# Patient Record
Sex: Male | Born: 1950 | Race: White | Hispanic: No | Marital: Married | Smoking: Never smoker
Health system: Southern US, Community
[De-identification: ages and names within clinical notes are randomized; demographics above are authoritative.]

## PROBLEM LIST (undated history)

## (undated) DIAGNOSIS — I4891 Unspecified atrial fibrillation: Secondary | ICD-10-CM

## (undated) DIAGNOSIS — Z9289 Personal history of other medical treatment: Secondary | ICD-10-CM

## (undated) DIAGNOSIS — M1990 Unspecified osteoarthritis, unspecified site: Secondary | ICD-10-CM

## (undated) DIAGNOSIS — G4733 Obstructive sleep apnea (adult) (pediatric): Secondary | ICD-10-CM

## (undated) DIAGNOSIS — R0600 Dyspnea, unspecified: Secondary | ICD-10-CM

## (undated) DIAGNOSIS — F410 Panic disorder [episodic paroxysmal anxiety] without agoraphobia: Secondary | ICD-10-CM

## (undated) DIAGNOSIS — J45909 Unspecified asthma, uncomplicated: Secondary | ICD-10-CM

## (undated) DIAGNOSIS — Z7901 Long term (current) use of anticoagulants: Secondary | ICD-10-CM

## (undated) DIAGNOSIS — I4510 Unspecified right bundle-branch block: Secondary | ICD-10-CM

## (undated) DIAGNOSIS — M109 Gout, unspecified: Secondary | ICD-10-CM

## (undated) DIAGNOSIS — Z87442 Personal history of urinary calculi: Secondary | ICD-10-CM

## (undated) DIAGNOSIS — N3281 Overactive bladder: Secondary | ICD-10-CM

## (undated) DIAGNOSIS — R001 Bradycardia, unspecified: Secondary | ICD-10-CM

## (undated) DIAGNOSIS — D039 Melanoma in situ, unspecified: Secondary | ICD-10-CM

## (undated) DIAGNOSIS — G4730 Sleep apnea, unspecified: Secondary | ICD-10-CM

## (undated) DIAGNOSIS — E559 Vitamin D deficiency, unspecified: Secondary | ICD-10-CM

## (undated) DIAGNOSIS — C61 Malignant neoplasm of prostate: Secondary | ICD-10-CM

## (undated) DIAGNOSIS — M479 Spondylosis, unspecified: Secondary | ICD-10-CM

## (undated) DIAGNOSIS — Z8619 Personal history of other infectious and parasitic diseases: Secondary | ICD-10-CM

## (undated) DIAGNOSIS — M48061 Spinal stenosis, lumbar region without neurogenic claudication: Secondary | ICD-10-CM

## (undated) DIAGNOSIS — I10 Essential (primary) hypertension: Secondary | ICD-10-CM

## (undated) HISTORY — DX: Personal history of other infectious and parasitic diseases: Z86.19

## (undated) HISTORY — DX: Gout, unspecified: M10.9

## (undated) HISTORY — DX: Malignant neoplasm of prostate: C61

## (undated) HISTORY — DX: Sleep apnea, unspecified: G47.30

## (undated) HISTORY — PX: COLONOSCOPY: SHX174

## (undated) HISTORY — DX: Personal history of other medical treatment: Z92.89

---
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
## 2010-12-04 DIAGNOSIS — C61 Malignant neoplasm of prostate: Secondary | ICD-10-CM

## 2010-12-04 HISTORY — PX: PROSTATE SURGERY: SHX751

## 2010-12-04 HISTORY — DX: Malignant neoplasm of prostate: C61

## 2011-05-16 HISTORY — PX: PROSTATECTOMY: SHX69

## 2013-07-04 HISTORY — PX: OTHER SURGICAL HISTORY: SHX169

## 2013-10-17 ENCOUNTER — Ambulatory Visit: Payer: Self-pay | Admitting: Family Medicine

## 2013-10-21 ENCOUNTER — Encounter: Payer: Self-pay | Admitting: *Deleted

## 2013-12-09 ENCOUNTER — Encounter: Payer: Self-pay | Admitting: General Surgery

## 2013-12-09 ENCOUNTER — Ambulatory Visit (INDEPENDENT_AMBULATORY_CARE_PROVIDER_SITE_OTHER): Payer: Managed Care, Other (non HMO) | Admitting: General Surgery

## 2013-12-09 ENCOUNTER — Other Ambulatory Visit: Payer: Self-pay | Admitting: General Surgery

## 2013-12-09 VITALS — BP 114/64 | HR 76 | Resp 16 | Ht 71.5 in | Wt 331.0 lb

## 2013-12-09 DIAGNOSIS — Z1211 Encounter for screening for malignant neoplasm of colon: Secondary | ICD-10-CM

## 2013-12-09 DIAGNOSIS — Z800 Family history of malignant neoplasm of digestive organs: Secondary | ICD-10-CM

## 2013-12-09 MED ORDER — POLYETHYLENE GLYCOL 3350 17 GM/SCOOP PO POWD: ORAL | Status: DC

## 2013-12-09 NOTE — Progress Notes (Signed)
Patient ID: Charles Floyd, male   DOB: 10-Mar-1951, 64 y.o.   MRN: [id number]  Chief Complaint  Patient presents with  . Colonoscopy    HPI Charles Floyd is a 63 y.o. male.  Here today to discuss having a colonoscopy. Last colonoscopy was 2003. Denies any gastrointestinal issues. HPI  Past Medical History  Diagnosis Date  . Cancer of prostate 2012  . Gout     Past Surgical History  Procedure Laterality Date  . Colonoscopy  Jan 2003    Dr Bary Castilla  . Prostatectomy  June 2012    [facility]    Family History  Problem Relation Age of Onset  . Cancer Mother     colon and breast    Social History History  Substance Use Topics  . Smoking status: Never Smoker   . Smokeless tobacco: Not on file  . Alcohol Use: Yes    No Known Allergies  Current Outpatient Prescriptions  Medication Sig Dispense Refill  . allopurinol (ZYLOPRIM) 100 MG tablet daily.      . polyethylene glycol powder (GLYCOLAX/MIRALAX) powder 255 grams one bottle for colonoscopy prep  255 g  0   No current facility-administered medications for this visit.    Review of Systems Review of Systems  Constitutional: Negative.   Respiratory: Negative.   Cardiovascular: Negative.     Blood pressure 114/64, pulse 76, resp. rate 16, height 5' 11.5" (1.816 m), weight 331 lb (150.141 kg).  Physical Exam Physical Exam  Constitutional: He is oriented to person, place, and time. He appears well-developed and well-nourished.  Cardiovascular: Normal rate, regular rhythm and normal heart sounds.   Pulmonary/Chest: Effort normal and breath sounds normal.  Abdominal: Soft.  Lymphadenopathy:    He has no cervical adenopathy.  Neurological: He is alert and oriented to person, place, and time.  Skin: Skin is warm and dry.    Data Reviewed December 16, 2001 colonoscopy was negative.  Assessment    Candidate for screening colonoscopy.     Plan    Colonoscopy with possible biopsy/polypectomy prn:  Information regarding the procedure, including its potential risks and complications (including but not limited to perforation of the bowel, which may require emergency surgery to repair, and bleeding) was verbally given to the patient. Educational information regarding lower instestinal endoscopy was given to the patient. Written instructions for how to complete the bowel prep using Miralax were provided. The importance of drinking ample fluids to avoid dehydration as a result of the prep emphasized.    Patient has been scheduled for a colonoscopy on 12-17-13 at [facility].   Robert Bellow 12/09/2013, 9:39 PM

## 2013-12-09 NOTE — Patient Instructions (Addendum)
The patient is aware to call back for any questions or concerns.  Colonoscopy A colonoscopy is an exam to look at your colon. This exam helps to find lumps (tumors), growths (polyps), puffiness (swelling), and bleeding in your colon.  BEFORE THE PROCEDURE  You may need to drink clear liquids for 2 days before the exam.  Ask your doctor about changing or stopping your regular medicines.  You may need liquid or medicines put in your butt (enema or laxatives) to help you poop.  You may need to drink a liquid over a short amount of time. This liquid cleans your colon.  Ask your doctor what time you need to arrive. PROCEDURE A tube is put in the opening of your butt (anus) and into your colon. The doctor will look for anything that is not normal. Your doctor may take a tissue sample (biopsy) from your colon to be looked at more closely. AFTER THE PROCEDURE  Have someone drive you home if you took pain medicine or a medicine to relax you (sedative).  You may see blood in your poop (stool). This is normal.  You may pass gas and have belly (abdominal) cramps. This is normal. Finding out the results of your test Ask when your test results will be ready. Make sure you get your test results. Document Released: 12/23/2010 Document Revised: 03/17/2013 Document Reviewed: 12/23/2010 [facility] Patient Information 2014 [place], [place].  Patient has been scheduled for a colonoscopy on 12-17-13 at [facility].

## 2013-12-17 ENCOUNTER — Ambulatory Visit: Payer: Self-pay | Admitting: General Surgery

## 2013-12-17 DIAGNOSIS — D128 Benign neoplasm of rectum: Secondary | ICD-10-CM

## 2013-12-17 DIAGNOSIS — Z800 Family history of malignant neoplasm of digestive organs: Secondary | ICD-10-CM

## 2013-12-17 DIAGNOSIS — Z1211 Encounter for screening for malignant neoplasm of colon: Secondary | ICD-10-CM

## 2013-12-17 DIAGNOSIS — D129 Benign neoplasm of anus and anal canal: Secondary | ICD-10-CM

## 2013-12-18 LAB — PATHOLOGY REPORT

## 2013-12-19 ENCOUNTER — Encounter: Payer: Self-pay | Admitting: General Surgery

## 2013-12-24 ENCOUNTER — Encounter: Payer: Self-pay | Admitting: General Surgery

## 2013-12-24 ENCOUNTER — Telehealth: Payer: Self-pay

## 2013-12-24 NOTE — Telephone Encounter (Signed)
Message copied by Lesly Rubenstein on Wed Dec 24, 2013  8:32 AM ------      Message from: [place], [place]      Created: Mon Dec 22, 2013  3:13 PM       Please notify the patient that the polyps removed at the time of his colonoscopy on the 14th were benign, but he should plan on a f/u exam in five years. Put in recalls. ------

## 2013-12-24 NOTE — Telephone Encounter (Signed)
MSG left for patient to call back for his pathology results.

## 2013-12-25 NOTE — Telephone Encounter (Signed)
Notified patient as instructed, patient pleased. Discussed follow-up appointments, patient agrees  

## 2013-12-31 ENCOUNTER — Encounter: Payer: Self-pay | Admitting: General Surgery

## 2015-02-19 LAB — BASIC METABOLIC PANEL
BUN: 14 mg/dL (ref 4–21)
CREATININE: 1 mg/dL (ref 0.6–1.3)
Glucose: 102 mg/dL
POTASSIUM: 5.2 mmol/L (ref 3.4–5.3)
Sodium: 142 mmol/L (ref 137–147)

## 2015-02-19 LAB — CBC AND DIFFERENTIAL
HCT: 44 % (ref 41–53)
Hemoglobin: 14.7 g/dL (ref 13.5–17.5)
Platelets: 252 10*3/uL (ref 150–399)
WBC: 10.6 10*3/mL

## 2015-02-19 LAB — LIPID PANEL
Cholesterol: 161 mg/dL (ref 0–200)
HDL: 39 mg/dL (ref 35–70)
LDL Cholesterol: 111 mg/dL
Triglycerides: 56 mg/dL (ref 40–160)

## 2015-02-19 LAB — HEPATIC FUNCTION PANEL
ALT: 22 U/L (ref 10–40)
AST: 21 U/L (ref 14–40)

## 2015-02-19 LAB — TSH: TSH: 4.59 u[IU]/mL (ref 0.41–5.90)

## 2015-02-19 LAB — PSA

## 2016-03-15 DIAGNOSIS — C61 Malignant neoplasm of prostate: Secondary | ICD-10-CM

## 2016-03-15 DIAGNOSIS — Z87442 Personal history of urinary calculi: Secondary | ICD-10-CM

## 2016-03-15 DIAGNOSIS — K429 Umbilical hernia without obstruction or gangrene: Secondary | ICD-10-CM | POA: Insufficient documentation

## 2016-03-15 DIAGNOSIS — M109 Gout, unspecified: Secondary | ICD-10-CM | POA: Insufficient documentation

## 2016-03-15 DIAGNOSIS — E559 Vitamin D deficiency, unspecified: Secondary | ICD-10-CM

## 2016-03-15 DIAGNOSIS — F410 Panic disorder [episodic paroxysmal anxiety] without agoraphobia: Secondary | ICD-10-CM

## 2016-03-15 DIAGNOSIS — Z8546 Personal history of malignant neoplasm of prostate: Secondary | ICD-10-CM | POA: Insufficient documentation

## 2016-03-22 ENCOUNTER — Encounter: Payer: Self-pay | Admitting: Family Medicine

## 2016-03-23 ENCOUNTER — Ambulatory Visit (INDEPENDENT_AMBULATORY_CARE_PROVIDER_SITE_OTHER): Payer: Managed Care, Other (non HMO) | Admitting: Family Medicine

## 2016-03-23 ENCOUNTER — Other Ambulatory Visit: Payer: Self-pay

## 2016-03-23 ENCOUNTER — Encounter: Payer: Self-pay | Admitting: Family Medicine

## 2016-03-23 VITALS — BP 118/86 | HR 76 | Temp 98.8°F | Resp 16 | Ht 72.0 in | Wt 355.2 lb

## 2016-03-23 DIAGNOSIS — Z13220 Encounter for screening for lipoid disorders: Secondary | ICD-10-CM

## 2016-03-23 DIAGNOSIS — M1A0710 Idiopathic chronic gout, right ankle and foot, without tophus (tophi): Secondary | ICD-10-CM | POA: Diagnosis not present

## 2016-03-23 DIAGNOSIS — L989 Disorder of the skin and subcutaneous tissue, unspecified: Secondary | ICD-10-CM | POA: Diagnosis not present

## 2016-03-23 DIAGNOSIS — E559 Vitamin D deficiency, unspecified: Secondary | ICD-10-CM

## 2016-03-23 DIAGNOSIS — Z0000 Encounter for general adult medical examination without abnormal findings: Secondary | ICD-10-CM | POA: Diagnosis not present

## 2016-03-23 DIAGNOSIS — M5416 Radiculopathy, lumbar region: Secondary | ICD-10-CM

## 2016-03-23 DIAGNOSIS — C61 Malignant neoplasm of prostate: Secondary | ICD-10-CM | POA: Diagnosis not present

## 2016-03-23 DIAGNOSIS — M545 Low back pain, unspecified: Secondary | ICD-10-CM | POA: Insufficient documentation

## 2016-03-23 DIAGNOSIS — Z23 Encounter for immunization: Secondary | ICD-10-CM

## 2016-03-23 DIAGNOSIS — G8929 Other chronic pain: Secondary | ICD-10-CM

## 2016-03-23 DIAGNOSIS — Z131 Encounter for screening for diabetes mellitus: Secondary | ICD-10-CM

## 2016-03-23 DIAGNOSIS — M5410 Radiculopathy, site unspecified: Secondary | ICD-10-CM | POA: Diagnosis not present

## 2016-03-23 DIAGNOSIS — Q792 Exomphalos: Secondary | ICD-10-CM | POA: Insufficient documentation

## 2016-03-23 DIAGNOSIS — R0683 Snoring: Secondary | ICD-10-CM | POA: Diagnosis not present

## 2016-03-23 DIAGNOSIS — Z87442 Personal history of urinary calculi: Secondary | ICD-10-CM | POA: Insufficient documentation

## 2016-03-23 MED ORDER — PREDNISONE 20 MG PO TABS: ORAL_TABLET | ORAL | Status: DC

## 2016-03-23 NOTE — Progress Notes (Signed)
Subjective:     Patient ID: Charles Floyd, male   DOB: Dec 15, 1950, 65 y.o.   MRN: [id number]  HPI  Chief Complaint  Patient presents with  . Annual Exam    Last PSA: 02/19/2015, EKG:04/13/2006, Zoster: 10/14/2012, colonoscopy: 2015 Patient is due for Tdap and Prevnar today.   States his primary limiting physical concern  is chronic right sided low back pain with radiation to his foot. Has taken two Aleve twice daily with little improvement. This has kept him from his walking program.   Review of Systems General:Not feeling well due to back pain HEENT: regular dental visits but has not had a formal eye exam. Uses otc readers. Cardiovascular: rare non-specific chest pain; shortness of breath associated with weight gain/deconditioning; no palpitations Respiratory: States he snores heavily and falls asleep during the day. Attributes a fall recently to falling asleep while standing still and leaning against a table. GI: occasional heartburn, no change in bowel habits or blood in the stool GU: nocturia x 1, no change in bladder habits. Not followed by [facility] urology any longer after robotic prostatectomy, Neuro: no change in memory; defers cognitive screen Psychiatric: not depressed Musculoskeletal: gout flareups in his right first toe controlled by allopurinol. Radicular back pain/numbness as noted above.    Objective:   Physical Exam  Constitutional: He appears well-developed and well-nourished. He appears distressed (mild back when when changing positions and lying supine).  Eyes: PERRLA, EOMI Neck: no thyromegaly, tenderness or nodules, carotids palpable without bruits. No cervical adenopathy ENT: TM's intact without inflammation; No tonsillar enlargement or exudate, Lungs: Clear Heart : RRR without murmur or gallop Abd: bowel sounds present, soft, non-tender, no organomegaly Extremities: no edema, Skin:left upper back with ulcerative/non-healing appearing area covered by bandaid    Assessment:    1. Annual physical exam  2. Chronic radicular pain of lower back - MR Lumbar Spine Wo Contrast; Future - predniSONE (DELTASONE) 20 MG tablet; Taper as follows: 3 pills for 4 days, two pills for 4 days, one pill for four days  Dispense: 24 tablet; Refill: 0  3. Need for diphtheria-tetanus-pertussis (Tdap) vaccine, adult/adolescent - Tdap vaccine greater than or equal to 7yo IM  4. Need for Streptococcus pneumoniae vaccinatio - Pneumococcal conjugate vaccine 13-valent  5. Prostate cancer ([place]) - PSA  6. Snoring: Epworth screen provided  7. Vitamin D deficiency  - Vitamin D (25 hydroxy)  8. Idiopathic chronic gout of right foot without tophus - Uric acid  9. Screening for cholesterol level - Lipid panel  10. Screening for diabetes mellitu - Comprehensive metabolic panel  11. Skin lesion of back - Ambulatory referral to Dermatology    Plan:    Further f/u pending lab and MRI results. Encouraged to update eye exam.

## 2016-03-23 NOTE — Patient Instructions (Addendum)
We will call you about lab results, MRI scheduling, and referral to dermatology. Drop off the Epworth screen when you are done. Consider scheduling an eye exam.

## 2016-03-25 LAB — LIPID PANEL
CHOLESTEROL TOTAL: 149 mg/dL (ref 100–199)
Chol/HDL Ratio: 3.9 ratio units (ref 0.0–5.0)
HDL: 38 mg/dL — AB (ref >39)
LDL Calculated: 100 mg/dL — ABNORMAL HIGH (ref 0–99)
Triglycerides: 56 mg/dL (ref 0–149)
VLDL CHOLESTEROL CAL: 11 mg/dL (ref 5–40)

## 2016-03-25 LAB — COMPREHENSIVE METABOLIC PANEL
ALK PHOS: 62 IU/L (ref 39–117)
ALT: 19 IU/L (ref 0–44)
AST: 19 IU/L (ref 0–40)
Albumin/Globulin Ratio: 1.1 — ABNORMAL LOW (ref 1.2–2.2)
Albumin: 3.6 g/dL (ref 3.6–4.8)
BILIRUBIN TOTAL: 0.6 mg/dL (ref 0.0–1.2)
BUN / CREAT RATIO: 16 (ref 10–24)
BUN: 16 mg/dL (ref 8–27)
CHLORIDE: 98 mmol/L (ref 96–106)
CO2: 28 mmol/L (ref 18–29)
Calcium: 9.3 mg/dL (ref 8.6–10.2)
Creatinine, Ser: 0.99 mg/dL (ref 0.76–1.27)
GFR calc Af Amer: 92 mL/min/{1.73_m2} (ref >59)
GFR calc non Af Amer: 80 mL/min/{1.73_m2} (ref >59)
GLUCOSE: 98 mg/dL (ref 65–99)
Globulin, Total: 3.3 g/dL (ref 1.5–4.5)
Potassium: 5.5 mmol/L — ABNORMAL HIGH (ref 3.5–5.2)
Sodium: 142 mmol/L (ref 134–144)
Total Protein: 6.9 g/dL (ref 6.0–8.5)

## 2016-03-25 LAB — VITAMIN D 25 HYDROXY (VIT D DEFICIENCY, FRACTURES): VIT D 25 HYDROXY: 14.8 ng/mL — AB (ref 30.0–100.0)

## 2016-03-25 LAB — PSA: Prostate Specific Ag, Serum: <0.1 ng/mL (ref 0.0–4.0)

## 2016-03-25 LAB — URIC ACID: Uric Acid: 7.8 mg/dL (ref 3.7–8.6)

## 2016-03-27 ENCOUNTER — Telehealth: Payer: Self-pay

## 2016-03-27 ENCOUNTER — Other Ambulatory Visit: Payer: Self-pay | Admitting: Family Medicine

## 2016-03-27 DIAGNOSIS — M109 Gout, unspecified: Secondary | ICD-10-CM

## 2016-03-27 MED ORDER — ALLOPURINOL 300 MG PO TABS: 300.0000 mg | ORAL_TABLET | Freq: Every day | ORAL | Status: DC

## 2016-03-27 NOTE — Telephone Encounter (Signed)
-----   Message from Carmon Ginsberg, [place] sent at 03/27/2016  8:01 AM EDT ----- No diabetes and your prostate lab remains undetectable. Vitamin D remains low so would take 1000 units daily over the counter. Your cholesterol is not an issue at this time. Mild increase in potassium-I would stop any supplements or salt substitute if you are using these products. Uric acid is not a low as it should be to lower recurrence so I would go up to 300 mg. Daily. I will sent that in. I see the main priorities right now are your back pain and probable sleep apnea. We are working on the MRI for your back. I will set up an initial sleep study if you wish to proceed.

## 2016-03-27 NOTE — Telephone Encounter (Signed)
[id number] 

## 2016-03-28 ENCOUNTER — Other Ambulatory Visit: Payer: Self-pay | Admitting: Family Medicine

## 2016-03-28 DIAGNOSIS — G4733 Obstructive sleep apnea (adult) (pediatric): Secondary | ICD-10-CM

## 2016-03-28 NOTE — Telephone Encounter (Signed)
Notified. KW

## 2016-03-31 ENCOUNTER — Encounter: Payer: Self-pay | Admitting: Family Medicine

## 2016-04-06 ENCOUNTER — Other Ambulatory Visit: Payer: Self-pay | Admitting: Family Medicine

## 2016-04-12 ENCOUNTER — Ambulatory Visit
Admission: RE | Admit: 2016-04-12 | Discharge: 2016-04-12 | Disposition: A | Discharge status: Home / Self Care | Payer: Medicare Other | Source: Ambulatory Visit | Attending: Family Medicine | Admitting: Family Medicine

## 2016-04-12 ENCOUNTER — Telehealth: Payer: Self-pay

## 2016-04-12 ENCOUNTER — Ambulatory Visit (INDEPENDENT_AMBULATORY_CARE_PROVIDER_SITE_OTHER): Payer: Medicare Other | Admitting: Family Medicine

## 2016-04-12 ENCOUNTER — Encounter: Payer: Self-pay | Admitting: Family Medicine

## 2016-04-12 VITALS — BP 124/72 | HR 88 | Temp 98.9°F | Resp 16 | Wt 353.4 lb

## 2016-04-12 DIAGNOSIS — M419 Scoliosis, unspecified: Secondary | ICD-10-CM | POA: Diagnosis not present

## 2016-04-12 DIAGNOSIS — E559 Vitamin D deficiency, unspecified: Secondary | ICD-10-CM | POA: Diagnosis not present

## 2016-04-12 DIAGNOSIS — M5136 Other intervertebral disc degeneration, lumbar region: Secondary | ICD-10-CM | POA: Diagnosis not present

## 2016-04-12 DIAGNOSIS — M545 Low back pain, unspecified: Secondary | ICD-10-CM

## 2016-04-12 DIAGNOSIS — M1A0710 Idiopathic chronic gout, right ankle and foot, without tophus (tophi): Secondary | ICD-10-CM

## 2016-04-12 DIAGNOSIS — M47816 Spondylosis without myelopathy or radiculopathy, lumbar region: Secondary | ICD-10-CM | POA: Diagnosis not present

## 2016-04-12 NOTE — Telephone Encounter (Signed)
-----   Message from Carmon Ginsberg, [place] sent at 04/12/2016 11:14 AM EDT ----- Radiologist reports mild spurring/arthritic change with mild curvature of your spine. I would recommend referral to an orthopedic specialist.

## 2016-04-12 NOTE — Patient Instructions (Signed)
Start higher dose of allopurinol and continue Vitamin D. We will recheck your levels in a few weeks. We will call with the result of the x-ray.

## 2016-04-12 NOTE — Progress Notes (Signed)
Subjective:     Patient ID: [name], male   DOB: [birth year], 65 y.o.   MRN: [id number]  HPI  Chief Complaint  Patient presents with  . Back Pain    Patient returns to office today for follow up for chronic radicular pain of the lower back. Patient was last seen in office on 03/23/16, at last visit patient was prescribed Prednisone 20mg  and we had discussed MRI of lumbar spine. Patient reports that he finished steroid on 5/5 and is still having pain in his lower back, he has been taking Aleve PRN.   States prednisone resolved the pain radiating down his right leg. Continue to have right low back pain and stiffness in his legs: "I feel like there is a doughnut around my lower back.". MRI of lower back not approved by his insurance.Has not started higher dose of allopurinol yet but has started Vitamin D otc.   Review of Systems  Respiratory: Positive for apnea (sleep study pending).   Skin:       Non-healing back lesion-dermatology appointment pending.       Objective:   Physical Exam  Constitutional: He appears well-developed and well-nourished. No distress.  Musculoskeletal:  Muscle strength in lower extremities 5/5. SLR's to 90 degrees without radiation of back pain.       Assessment:    1. Idiopathic chronic gout of right foot without tophus  2. Right-sided low back pain without sciatica - DG Lumbar Spine Complete; Future  3. Vitamin D deficiency    Plan:    Will check uric acid and vitamin D levels in approximately 8-12 weeks. Further f/u pending x-ray results.

## 2016-04-12 NOTE — Telephone Encounter (Signed)
[id number] 

## 2016-04-17 NOTE — Telephone Encounter (Signed)
[id number] 

## 2016-04-20 ENCOUNTER — Telehealth: Payer: Self-pay | Admitting: Family Medicine

## 2016-04-20 NOTE — Telephone Encounter (Signed)
Pt informed. He will call back to let me know

## 2016-04-20 NOTE — Telephone Encounter (Signed)
[id number] 

## 2016-04-20 NOTE — Telephone Encounter (Signed)
Pt is returning a call.  Please call pt back @ [phone] Thanks CC

## 2016-04-26 ENCOUNTER — Encounter: Payer: Self-pay | Admitting: Family Medicine

## 2016-04-26 ENCOUNTER — Ambulatory Visit (INDEPENDENT_AMBULATORY_CARE_PROVIDER_SITE_OTHER): Payer: Medicare Other | Admitting: Family Medicine

## 2016-04-26 VITALS — BP 92/70 | HR 112 | Temp 98.7°F | Resp 18 | Wt 352.0 lb

## 2016-04-26 DIAGNOSIS — J40 Bronchitis, not specified as acute or chronic: Secondary | ICD-10-CM

## 2016-04-26 MED ORDER — HYDROCODONE-HOMATROPINE 5-1.5 MG/5ML PO SYRP: ORAL_SOLUTION | ORAL | Status: DC

## 2016-04-26 MED ORDER — AZITHROMYCIN 250 MG PO TABS: ORAL_TABLET | ORAL | Status: DC

## 2016-04-26 NOTE — Patient Instructions (Signed)
Discussed use of Delsym for cough.

## 2016-04-26 NOTE — Progress Notes (Signed)
Subjective:     Patient ID: Charles Floyd, male   DOB: 04-14-1951, 65 y.o.   MRN: [id number]  HPI  Chief Complaint  Patient presents with  . Cough    Patient comes in office today with concerns of cough and congestion for the past ten days. Assoicated with cough patient complains of shortness of breath and tightness in the chest, he has been taking otc Tylenol.   States he has been having paroxysms of coughing without sputum production onset on or about  last o.v. 5/10. Reports shortness of breath occurs during coughing. No asthma hx though states he had bronchitis when he was a child. He is pending sleep study in June for severe OSA.Marland Kitchen Has been taking Tylenol cold for his sx but denies other cold sx except "a little congestion".   Review of Systems     Objective:   Physical Exam  Constitutional: He appears well-developed.  Ears: T.M's intact without inflammation Throat: no tonsillar enlargement or exudate Neck: no cervical adenopathy Lungs: clear     Assessment:    1. Bronchitis - azithromycin (ZITHROMAX Z-PAK) 250 MG tablet; Two pills the first day then one pill daily.  Dispense: 6 each; Refill: 0 - HYDROcodone-homatropine (HYCODAN) 5-1.5 MG/5ML syrup; 5 ml 4-6 hours as needed for cough  Dispense: 240 mL; Refill: 0    Plan:    Discussed use of Delsym.

## 2016-04-28 ENCOUNTER — Telehealth: Payer: Self-pay | Admitting: Family Medicine

## 2016-04-28 ENCOUNTER — Telehealth: Payer: Self-pay

## 2016-04-28 NOTE — Telephone Encounter (Signed)
Pt informed and voiced understanding of results. See other note.

## 2016-04-28 NOTE — Telephone Encounter (Signed)
Pt informed and voiced understanding of results. 

## 2016-04-28 NOTE — Telephone Encounter (Signed)
Patient states that he is on Allopurinol and gout has been doing good bu after starting antibiotic and cough medication on wednesday this week his gout is flared up severe. Is this a possible side effect? Can he try Indomethacin or something to help with flare up?-aa

## 2016-04-28 NOTE — Telephone Encounter (Signed)
Ibuprofen will work as well: take 800 mg. (4 tablets) 3 x day with food.

## 2016-04-28 NOTE — Telephone Encounter (Signed)
Pt returned phone call.  

## 2016-04-28 NOTE — Telephone Encounter (Signed)
LMTCB

## 2016-05-05 ENCOUNTER — Other Ambulatory Visit: Payer: Self-pay | Admitting: Family Medicine

## 2016-05-05 ENCOUNTER — Telehealth: Payer: Self-pay | Admitting: Family Medicine

## 2016-05-05 DIAGNOSIS — G8929 Other chronic pain: Secondary | ICD-10-CM

## 2016-05-05 DIAGNOSIS — M545 Low back pain, unspecified: Secondary | ICD-10-CM

## 2016-05-05 NOTE — Telephone Encounter (Signed)
Pt is requesting referral to see Dr Leanor Kail for back pain.Pt can be reached at cell # listed in chart

## 2016-05-05 NOTE — Telephone Encounter (Signed)
Review. KW 

## 2016-05-05 NOTE — Telephone Encounter (Signed)
Referral in progress. 

## 2016-05-08 ENCOUNTER — Telehealth: Payer: Self-pay | Admitting: Family Medicine

## 2016-05-08 NOTE — Telephone Encounter (Signed)
Please advise or would you suggest follow up appt? KW

## 2016-05-08 NOTE — Telephone Encounter (Signed)
Pt states he was seen about 10 days ago for cough and congestion.  Pt has completed the Rx.  Pt states he is still having cough and some congestion.  Pt is asking if he can get something else to help with this.  [employer].  CB#[phone]/MW

## 2016-05-09 ENCOUNTER — Other Ambulatory Visit: Payer: Self-pay | Admitting: Family Medicine

## 2016-05-09 DIAGNOSIS — M10472 Other secondary gout, left ankle and foot: Secondary | ICD-10-CM

## 2016-05-09 MED ORDER — PREDNISONE 10 MG PO TABS: ORAL_TABLET | ORAL | Status: DC

## 2016-05-09 NOTE — Telephone Encounter (Signed)
Cough has improved but has residual chest discomfort he attributes to his muscles being sore from coughing. His gout flare in his left toe has not improved with ibuprofen: "I can't got to work." Will call in prednisone taper. He his due orthopedic evaluation this week for chronic back pain and a sleep study for severe OSA.

## 2016-05-10 ENCOUNTER — Ambulatory Visit: Payer: Medicare Other | Attending: Otolaryngology

## 2016-05-10 DIAGNOSIS — G4733 Obstructive sleep apnea (adult) (pediatric): Secondary | ICD-10-CM | POA: Insufficient documentation

## 2016-05-10 DIAGNOSIS — R0683 Snoring: Secondary | ICD-10-CM | POA: Diagnosis not present

## 2016-05-10 DIAGNOSIS — E669 Obesity, unspecified: Secondary | ICD-10-CM | POA: Diagnosis not present

## 2016-05-11 DIAGNOSIS — M5440 Lumbago with sciatica, unspecified side: Secondary | ICD-10-CM | POA: Diagnosis not present

## 2016-05-11 DIAGNOSIS — M4807 Spinal stenosis, lumbosacral region: Secondary | ICD-10-CM | POA: Diagnosis not present

## 2016-05-18 ENCOUNTER — Encounter: Payer: Self-pay | Admitting: Family Medicine

## 2016-06-01 DIAGNOSIS — M5440 Lumbago with sciatica, unspecified side: Secondary | ICD-10-CM | POA: Diagnosis not present

## 2016-06-01 DIAGNOSIS — M5441 Lumbago with sciatica, right side: Secondary | ICD-10-CM | POA: Diagnosis not present

## 2016-06-01 DIAGNOSIS — M4807 Spinal stenosis, lumbosacral region: Secondary | ICD-10-CM | POA: Diagnosis not present

## 2016-06-02 ENCOUNTER — Encounter: Payer: Self-pay | Admitting: Family Medicine

## 2016-06-02 ENCOUNTER — Ambulatory Visit (INDEPENDENT_AMBULATORY_CARE_PROVIDER_SITE_OTHER): Payer: Medicare Other | Admitting: Family Medicine

## 2016-06-02 VITALS — BP 116/78 | HR 94 | Temp 99.0°F | Resp 20 | Wt 348.0 lb

## 2016-06-02 DIAGNOSIS — J069 Acute upper respiratory infection, unspecified: Secondary | ICD-10-CM

## 2016-06-02 DIAGNOSIS — G4733 Obstructive sleep apnea (adult) (pediatric): Secondary | ICD-10-CM | POA: Diagnosis not present

## 2016-06-02 MED ORDER — HYDROCODONE-HOMATROPINE 5-1.5 MG/5ML PO SYRP: ORAL_SOLUTION | ORAL | Status: DC

## 2016-06-02 NOTE — Progress Notes (Signed)
Subjective:     Patient ID: Charles Floyd, male   DOB: 11-26-1951, 64 y.o.   MRN: [id number]  HPI  Chief Complaint  Patient presents with  . Cough    x 3 days  Reports onset of sinus congestion, PND and accompanying cough on 6/25. Has been using Mucinex DM and left over rx cough syrup. Denies fever/chills. Reports wife is sick as well. He still awaits insurance clearance for his C-Pap.   Review of Systems     Objective:   Physical Exam  Constitutional: He appears well-developed and well-nourished. No distress.  Ears: T.M's intact without inflammation Throat: no tonsillar enlargement or exudate Neck: no cervical adenopathy Lungs: clear    Assessment:    1. Upper respiratory infection - HYDROcodone-homatropine (HYCODAN) 5-1.5 MG/5ML syrup; 5 ml 4-6 hours as needed for cough  Dispense: 240 mL; Refill: 0      Plan:    Discussed use of Mucinex D. Will call 7/3 if not improving.

## 2016-06-02 NOTE — Patient Instructions (Signed)
Call me Monday, 7/3 if sinuses not improving. Add Mucinex D for congestion.

## 2016-06-09 DIAGNOSIS — M5441 Lumbago with sciatica, right side: Secondary | ICD-10-CM | POA: Diagnosis not present

## 2016-06-12 DIAGNOSIS — M5441 Lumbago with sciatica, right side: Secondary | ICD-10-CM | POA: Diagnosis not present

## 2016-06-22 DIAGNOSIS — M5441 Lumbago with sciatica, right side: Secondary | ICD-10-CM | POA: Diagnosis not present

## 2016-06-26 DIAGNOSIS — M5441 Lumbago with sciatica, right side: Secondary | ICD-10-CM | POA: Diagnosis not present

## 2016-08-03 ENCOUNTER — Telehealth: Payer: Self-pay | Admitting: Family Medicine

## 2016-08-03 DIAGNOSIS — L578 Other skin changes due to chronic exposure to nonionizing radiation: Secondary | ICD-10-CM | POA: Diagnosis not present

## 2016-08-03 DIAGNOSIS — D229 Melanocytic nevi, unspecified: Secondary | ICD-10-CM | POA: Diagnosis not present

## 2016-08-03 DIAGNOSIS — L309 Dermatitis, unspecified: Secondary | ICD-10-CM | POA: Diagnosis not present

## 2016-08-03 DIAGNOSIS — L720 Epidermal cyst: Secondary | ICD-10-CM | POA: Diagnosis not present

## 2016-08-03 DIAGNOSIS — Z1283 Encounter for screening for malignant neoplasm of skin: Secondary | ICD-10-CM | POA: Diagnosis not present

## 2016-08-03 DIAGNOSIS — L812 Freckles: Secondary | ICD-10-CM | POA: Diagnosis not present

## 2016-08-03 DIAGNOSIS — D485 Neoplasm of uncertain behavior of skin: Secondary | ICD-10-CM | POA: Diagnosis not present

## 2016-08-03 DIAGNOSIS — I8310 Varicose veins of unspecified lower extremity with inflammation: Secondary | ICD-10-CM | POA: Diagnosis not present

## 2016-08-03 DIAGNOSIS — L821 Other seborrheic keratosis: Secondary | ICD-10-CM | POA: Diagnosis not present

## 2016-08-03 DIAGNOSIS — C44519 Basal cell carcinoma of skin of other part of trunk: Secondary | ICD-10-CM | POA: Diagnosis not present

## 2016-08-03 NOTE — Telephone Encounter (Signed)
Needs appointment with [name] for follow up OSA

## 2016-08-03 NOTE — Telephone Encounter (Signed)
Please advise 

## 2016-08-03 NOTE — Telephone Encounter (Signed)
Sharyn Lull from [facility] is calling stating that the pt needs a face to face office visit before any supplies can be ordered for his cpap machine. Sharyn Lull then said we can fax  to her the office notes @ [phone]. Sharyn Lull states if we have any questions to please call her @ [phone].

## 2016-08-04 NOTE — Telephone Encounter (Signed)
[id number] 

## 2016-08-11 NOTE — Telephone Encounter (Signed)
[id number] 

## 2016-08-14 NOTE — Telephone Encounter (Signed)
appt has been made. KW

## 2016-08-16 ENCOUNTER — Encounter: Payer: Self-pay | Admitting: Family Medicine

## 2016-08-16 ENCOUNTER — Ambulatory Visit (INDEPENDENT_AMBULATORY_CARE_PROVIDER_SITE_OTHER): Payer: Medicare Other | Admitting: Family Medicine

## 2016-08-16 VITALS — BP 102/74 | HR 84 | Temp 98.7°F | Resp 16 | Wt 351.8 lb

## 2016-08-16 DIAGNOSIS — R0789 Other chest pain: Secondary | ICD-10-CM | POA: Diagnosis not present

## 2016-08-16 DIAGNOSIS — I8001 Phlebitis and thrombophlebitis of superficial vessels of right lower extremity: Secondary | ICD-10-CM

## 2016-08-16 DIAGNOSIS — G4733 Obstructive sleep apnea (adult) (pediatric): Secondary | ICD-10-CM

## 2016-08-16 NOTE — Progress Notes (Signed)
Subjective:     Patient ID: Charles Floyd, male   DOB: 1951/02/19, 65 y.o.   MRN: [id number]  HPI  Chief Complaint  Patient presents with  . Sleep Apnea    Patient comes in office today to follow up, patient states that he is still using his C-pap machine and is sleeping much better. On average patient sleeps 5-6hrs at night only waking up once to use restroom  . Varicose Veins    Patient has concerns of vein on his right lower leg, he states that it is bigger and is hard and sore to the touch. Patient states that his skin is very warm to touch around vein  States he is no longer snoring or having bad dreams. Not falling asleep in the car or at work as previously. Also had been experiencing x2 weeks a fullness in his left lateral chest and back which is aggravated by coughing,sneezing or deep inspiration. He was participating in physical therapy before the onset of his sx. Also has pending removal of a skin cancer on his left upper back.   Review of Systems     Objective:   Physical Exam  Constitutional: He appears well-developed and well-nourished. No distress.  Pulmonary/Chest: Breath sounds normal. He exhibits no tenderness.  Skin:  Right lower extremity with one to two cm area of varicose vein induration, warmth, and tenderness       Assessment:    1. Obstructive sleep apnea: improved on C-Pap with Epworth score now 10 (previously 19).  2. Phlebitis and thrombophlebitis of superficial vessels of lower extremities, right  3. Chest discomfort - DG Chest 2 View; Future    Plan:    Discussed use of ASA, warm compresses, and elevation for his leg. Further f/u pending x-ray report.

## 2016-08-16 NOTE — Patient Instructions (Signed)
We will call you with the x-ray report. Try warm compresses and elevation for your leg. Also take regular strength aspirin 3 x day with meals. If not improving call for vascular referral.,

## 2016-09-05 DIAGNOSIS — C44519 Basal cell carcinoma of skin of other part of trunk: Secondary | ICD-10-CM | POA: Diagnosis not present

## 2016-09-12 DIAGNOSIS — L820 Inflamed seborrheic keratosis: Secondary | ICD-10-CM | POA: Diagnosis not present

## 2016-12-12 DIAGNOSIS — L720 Epidermal cyst: Secondary | ICD-10-CM | POA: Diagnosis not present

## 2016-12-19 DIAGNOSIS — L820 Inflamed seborrheic keratosis: Secondary | ICD-10-CM | POA: Diagnosis not present

## 2017-06-20 DIAGNOSIS — L820 Inflamed seborrheic keratosis: Secondary | ICD-10-CM | POA: Diagnosis not present

## 2017-06-20 DIAGNOSIS — L812 Freckles: Secondary | ICD-10-CM | POA: Diagnosis not present

## 2017-06-20 DIAGNOSIS — L578 Other skin changes due to chronic exposure to nonionizing radiation: Secondary | ICD-10-CM | POA: Diagnosis not present

## 2017-06-20 DIAGNOSIS — L821 Other seborrheic keratosis: Secondary | ICD-10-CM | POA: Diagnosis not present

## 2017-06-20 DIAGNOSIS — D1800 Hemangioma unspecified site: Secondary | ICD-10-CM | POA: Diagnosis not present

## 2017-06-20 DIAGNOSIS — D229 Melanocytic nevi, unspecified: Secondary | ICD-10-CM | POA: Diagnosis not present

## 2017-06-20 DIAGNOSIS — B353 Tinea pedis: Secondary | ICD-10-CM | POA: Diagnosis not present

## 2017-06-20 DIAGNOSIS — Z85828 Personal history of other malignant neoplasm of skin: Secondary | ICD-10-CM | POA: Diagnosis not present

## 2017-06-20 DIAGNOSIS — L570 Actinic keratosis: Secondary | ICD-10-CM | POA: Diagnosis not present

## 2017-06-20 DIAGNOSIS — B351 Tinea unguium: Secondary | ICD-10-CM | POA: Diagnosis not present

## 2017-07-18 ENCOUNTER — Other Ambulatory Visit: Payer: Self-pay | Admitting: Unknown Physician Specialty

## 2017-07-18 DIAGNOSIS — M1A9XX0 Chronic gout, unspecified, without tophus (tophi): Secondary | ICD-10-CM | POA: Diagnosis not present

## 2017-07-18 DIAGNOSIS — Z6841 Body Mass Index (BMI) 40.0 and over, adult: Secondary | ICD-10-CM | POA: Diagnosis not present

## 2017-07-18 DIAGNOSIS — M65332 Trigger finger, left middle finger: Secondary | ICD-10-CM | POA: Insufficient documentation

## 2017-07-18 DIAGNOSIS — M545 Low back pain: Secondary | ICD-10-CM | POA: Diagnosis not present

## 2017-07-18 DIAGNOSIS — G8929 Other chronic pain: Secondary | ICD-10-CM

## 2017-07-18 DIAGNOSIS — M65331 Trigger finger, right middle finger: Secondary | ICD-10-CM | POA: Insufficient documentation

## 2017-08-08 ENCOUNTER — Ambulatory Visit
Admission: RE | Admit: 2017-08-08 | Discharge: 2017-08-08 | Disposition: A | Discharge status: Home / Self Care | Payer: Medicare Other | Source: Ambulatory Visit | Attending: Unknown Physician Specialty | Admitting: Unknown Physician Specialty

## 2017-08-08 DIAGNOSIS — G8929 Other chronic pain: Secondary | ICD-10-CM

## 2017-08-08 DIAGNOSIS — M65332 Trigger finger, left middle finger: Secondary | ICD-10-CM

## 2017-08-08 DIAGNOSIS — M545 Low back pain: Principal | ICD-10-CM

## 2017-08-23 ENCOUNTER — Ambulatory Visit (INDEPENDENT_AMBULATORY_CARE_PROVIDER_SITE_OTHER): Payer: Medicare Other | Admitting: Family Medicine

## 2017-08-23 ENCOUNTER — Encounter: Payer: Self-pay | Admitting: Family Medicine

## 2017-08-23 VITALS — BP 124/82 | HR 95 | Temp 98.7°F | Wt 349.8 lb

## 2017-08-23 DIAGNOSIS — R110 Nausea: Secondary | ICD-10-CM | POA: Diagnosis not present

## 2017-08-23 LAB — CBC WITH DIFFERENTIAL/PLATELET
BASOS ABS: 91 {cells}/uL (ref 0–200)
Basophils Relative: 0.5 %
Eosinophils Absolute: 181 cells/uL (ref 15–500)
Eosinophils Relative: 1 %
HEMATOCRIT: 45.8 % (ref 38.5–50.0)
HEMOGLOBIN: 15.3 g/dL (ref 13.2–17.1)
LYMPHS ABS: 3077 {cells}/uL (ref 850–3900)
MCH: 29.3 pg (ref 27.0–33.0)
MCHC: 33.4 g/dL (ref 32.0–36.0)
MCV: 87.6 fL (ref 80.0–100.0)
MPV: 10.6 fL (ref 7.5–12.5)
Monocytes Relative: 10.6 %
NEUTROS ABS: 12833 {cells}/uL — AB (ref 1500–7800)
NEUTROS PCT: 70.9 %
Platelets: 283 10*3/uL (ref 140–400)
RBC: 5.23 10*6/uL (ref 4.20–5.80)
RDW: 13 % (ref 11.0–15.0)
Total Lymphocyte: 17 %
WBC: 18.1 10*3/uL — ABNORMAL HIGH (ref 3.8–10.8)
WBCMIX: 1919 {cells}/uL — AB (ref 200–950)

## 2017-08-23 LAB — COMPREHENSIVE METABOLIC PANEL
AG RATIO: 1.2 (calc) (ref 1.0–2.5)
ALBUMIN MSPROF: 3.9 g/dL (ref 3.6–5.1)
ALKALINE PHOSPHATASE (APISO): 70 U/L (ref 40–115)
ALT: 54 U/L — ABNORMAL HIGH (ref 9–46)
AST: 53 U/L — ABNORMAL HIGH (ref 10–35)
BILIRUBIN TOTAL: 2.1 mg/dL — AB (ref 0.2–1.2)
BUN: 14 mg/dL (ref 7–25)
CALCIUM: 9.4 mg/dL (ref 8.6–10.3)
CO2: 31 mmol/L (ref 20–32)
Chloride: 99 mmol/L (ref 98–110)
Creat: 1.16 mg/dL (ref 0.70–1.25)
Globulin: 3.3 g/dL (calc) (ref 1.9–3.7)
Glucose, Bld: 116 mg/dL — ABNORMAL HIGH (ref 65–99)
POTASSIUM: 4.7 mmol/L (ref 3.5–5.3)
SODIUM: 138 mmol/L (ref 135–146)
TOTAL PROTEIN: 7.2 g/dL (ref 6.1–8.1)

## 2017-08-23 LAB — AMYLASE: AMYLASE: 18 U/L — AB (ref 21–101)

## 2017-08-23 LAB — LIPASE: Lipase: 5 U/L — ABNORMAL LOW (ref 7–60)

## 2017-08-23 NOTE — Progress Notes (Signed)
Patient: Charles Floyd Male    DOB: [birth year]   65 y.o.   MRN: [id number] Visit Date: 08/23/2017  Today's Provider: Vernie Murders, PA   Chief Complaint  Patient presents with  . Abdominal Pain  . Nausea   Subjective:    Abdominal Pain  This is a new problem. Episode onset: Monday. The onset quality is sudden. The pain is located in the generalized abdominal region. The quality of the pain is dull. The abdominal pain does not radiate. Associated symptoms include nausea. Vomiting: dry heave. The pain is aggravated by eating. The pain is relieved by bowel movements. He has tried antacids for the symptoms. The treatment provided no relief.   Past Medical History:  Diagnosis Date  . Cancer of prostate [facility]) 2012   Robotic Assisted prostatectomy 2012 [facility]  . Gout   . History of chicken pox   . History of measles   . History of mumps    Past Surgical History:  Procedure Laterality Date  . COLONOSCOPY  Jan 2003   Dr Bary Castilla  . kidney stone extraction  07/2013  . PROSTATECTOMY  05/16/2011   [facility]. Abdominal Laparoscopic, robotic assisted   Family History  Problem Relation Age of Onset  . Cancer Mother        colon and breast  . CAD Mother   . Anemia Mother   . Colon cancer Mother   . Congestive Heart Failure Father   . Tuberculosis Father   . CAD Father   . Emphysema Father    No Known Allergies   Previous Medications   ALLOPURINOL (ZYLOPRIM) 300 MG TABLET    Take 1 tablet (300 mg total) by mouth daily.   TRIAMCINOLONE CREAM (KENALOG) 0.1 %        Review of Systems  Constitutional: Negative.   Respiratory: Negative.   Cardiovascular: Negative.   Gastrointestinal: Positive for abdominal pain and nausea. Vomiting: dry heave.    Social History  Substance Use Topics  . Smoking status: Never Smoker  . Smokeless tobacco: Never Used  . Alcohol use 0.0 oz/week     Comment: occasional use   Objective:   BP 124/82 (BP Location: Right Arm, Patient Position:  Sitting, Cuff Size: Large)   Pulse 95   Temp 98.7 F (37.1 C) (Oral)   Wt (!) 349 lb 12.8 oz (158.7 kg)   SpO2 94%   BMI 47.44 kg/m   Physical Exam  Constitutional: He is oriented to person, place, and time. He appears well-developed and well-nourished. No distress.  HENT:  Head: Normocephalic and atraumatic.  Right Ear: Hearing normal.  Left Ear: Hearing normal.  Nose: Nose normal.  Eyes: Conjunctivae and lids are normal. Right eye exhibits no discharge. Left eye exhibits no discharge. No scleral icterus.  Cardiovascular: Normal rate.   Pulmonary/Chest: Effort normal and breath sounds normal. No respiratory distress.  Abdominal: Soft. Bowel sounds are normal. He exhibits no mass. There is no tenderness. There is no guarding.  Neurological: He is alert and oriented to person, place, and time.  Skin: Skin is intact. No lesion and no rash noted.  Psychiatric: He has a normal mood and affect. His speech is normal and behavior is normal. Thought content normal.      Assessment & Plan:     1. Nausea Developed persistent nausea without vomiting or diarrhea on 08-19-17. No abdominal pains or fever. Felt nauseated enough to try to trigger vomiting on 08-20-17 but only had dry heaves. Has  continued to eat a little each day and has had solid stool on 08-21-17. Notices nausea worse after eating and better on an empty stomach. Encouraged to drink plenty of fluids, may use Emetrol or Meclizine for nausea and get labs to check hydration, signs of infection or liver, GB or pancreas disease. May need RUQ abdominal ultrasound pending lab reports. States he really doesn't want to take any medications, just be sure he doesn't have gallstones. - CBC with Differential/Platelet - Comprehensive metabolic panel - Amylase - Lipase

## 2017-08-23 NOTE — Patient Instructions (Signed)
Nausea, Adult  Nausea is the feeling of an upset stomach or having to vomit. Nausea on its own is not usually a serious concern, but it may be an early sign of a more serious medical problem. As nausea gets worse, it can lead to vomiting. If vomiting develops, or if you are not able to drink enough fluids, you are at risk of becoming dehydrated. Dehydration can make you tired and thirsty, cause you to have a dry mouth, and decrease how often you urinate. Older adults and people with other diseases or a weak immune system are at higher risk for dehydration. The main goals of treating your nausea are:  · To limit repeated nausea episodes.  · To prevent vomiting and dehydration.    Follow these instructions at home:  Follow instructions from your health care provider about how to care for yourself at home.  Eating and drinking   Follow these recommendations as told by your health care provider:  · Take an oral rehydration solution (ORS). This is a drink that is sold at pharmacies and retail stores.  · Drink clear fluids in small amounts as you are able. Clear fluids include water, ice chips, diluted fruit juice, and low-calorie sports drinks.  · Eat bland, easy-to-digest foods in small amounts as you are able. These foods include bananas, applesauce, rice, lean meats, toast, and crackers.  · Avoid drinking fluids that contain a lot of sugar or caffeine, such as energy drinks, sports drinks, and soda.  · Avoid alcohol.  · Avoid spicy or fatty foods.    General instructions   · Drink enough fluid to keep your urine clear or pale yellow.  · Wash your hands often. If soap and water are not available, use hand sanitizer.  · Make sure that all people in your household wash their hands well and often.  · Rest at home while you recover.  · Take over-the-counter and prescription medicines only as told by your health care provider.  · Breathe slowly and deeply when you feel nauseous.  · Watch your condition for any  changes.  · Keep all follow-up visits as told by your health care provider. This is important.  Contact a health care provider if:  · You have a headache.  · You have new symptoms.  · Your nausea gets worse.  · You have a fever.  · You feel light-headed or dizzy.  · You vomit.  · You cannot keep fluids down.  Get help right away if:  · You have pain in your chest, neck, arm, or jaw.  · You feel extremely weak or you faint.  · You have vomit that is bright red or looks like coffee grounds.  · You have bloody or black stools or stools that look like tar.  · You have a severe headache, a stiff neck, or both.  · You have severe pain, cramping, or bloating in your abdomen.  · You have a rash.  · You have difficulty breathing or are breathing very quickly.  · Your heart is beating very quickly.  · Your skin feels cold and clammy.  · You feel confused.  · You have pain when you urinate.  · You have signs of dehydration, such as:  ? Dark urine, very little, or no urine.  ? Cracked lips.  ? Dry mouth.  ? Sunken eyes.  ? Sleepiness.  ? Weakness.  These symptoms may represent a serious problem that is an emergency. Do   not wait to see if the symptoms will go away. Get medical help right away. Call your local emergency services (911 in the [place] Do not drive yourself to the hospital.  This information is not intended to replace advice given to you by your health care provider. Make sure you discuss any questions you have with your health care provider.  Document Released: 12/28/2004 Document Revised: 04/24/2016 Document Reviewed: 07/27/2015  Elsevier Interactive Patient Education © 2017 [employer]vier Inc.

## 2017-08-24 ENCOUNTER — Other Ambulatory Visit: Payer: Self-pay

## 2017-08-24 DIAGNOSIS — R1011 Right upper quadrant pain: Secondary | ICD-10-CM

## 2017-08-24 MED ORDER — CIPROFLOXACIN HCL 500 MG PO TABS: 500.0000 mg | ORAL_TABLET | Freq: Two times a day (BID) | ORAL | 0 refills | Status: DC

## 2017-08-24 NOTE — Telephone Encounter (Signed)
Patient advised. He verbalized understanding. RX sent to pharmacy. Ultrasound ordered.

## 2017-08-24 NOTE — Telephone Encounter (Signed)
-----   Message from Margo Common, [place] sent at 08/24/2017  8:19 AM EDT ----- Elevated WBC count indicating infection. No elevation of amylase or lipase to indicate pancreatitis. Slight elevation of liver enzymes. Recommend scheduling RUQ ultrasound to check for gallbladder disease and take Ciprofloxacin 500 mg BID #14. Go to ER if abdominal pain or fever develops. Schedule follow up pending ultrasound report.

## 2017-08-24 NOTE — Telephone Encounter (Signed)
LMTCB

## 2017-08-29 ENCOUNTER — Ambulatory Visit: Payer: Medicare Other

## 2017-08-30 ENCOUNTER — Ambulatory Visit
Admission: RE | Admit: 2017-08-30 | Discharge: 2017-08-30 | Disposition: A | Discharge status: Home / Self Care | Payer: Medicare Other | Source: Ambulatory Visit | Attending: Family Medicine | Admitting: Family Medicine

## 2017-08-30 ENCOUNTER — Telehealth: Payer: Self-pay | Admitting: Family Medicine

## 2017-08-30 DIAGNOSIS — R1011 Right upper quadrant pain: Secondary | ICD-10-CM | POA: Diagnosis not present

## 2017-08-30 DIAGNOSIS — K8020 Calculus of gallbladder without cholecystitis without obstruction: Secondary | ICD-10-CM | POA: Insufficient documentation

## 2017-08-30 DIAGNOSIS — K760 Fatty (change of) liver, not elsewhere classified: Secondary | ICD-10-CM | POA: Insufficient documentation

## 2017-08-30 NOTE — Telephone Encounter (Signed)
Pt calling back regarding his visit this week for his stomach pain and the [place] that was done.  He said he was told by Simona Huh to call back.   His call back is [phone]  Thanks teri

## 2017-08-30 NOTE — Telephone Encounter (Signed)
Referral ordered. Patient advised he will be getting a call from [facility] with appointment date and time. He prefers to see Dr. Terri Piedra.

## 2017-08-30 NOTE — Telephone Encounter (Signed)
Ultrasound confirms gallstones and chronic infection of gallbladder. Needs referral to a general surgeon.

## 2017-09-06 ENCOUNTER — Encounter: Payer: Self-pay | Admitting: *Deleted

## 2017-09-11 ENCOUNTER — Ambulatory Visit (INDEPENDENT_AMBULATORY_CARE_PROVIDER_SITE_OTHER): Payer: Medicare Other | Admitting: General Surgery

## 2017-09-11 ENCOUNTER — Encounter: Payer: Self-pay | Admitting: General Surgery

## 2017-09-11 VITALS — BP 142/80 | HR 76 | Resp 16 | Ht 71.0 in | Wt 342.0 lb

## 2017-09-11 DIAGNOSIS — K8020 Calculus of gallbladder without cholecystitis without obstruction: Secondary | ICD-10-CM | POA: Insufficient documentation

## 2017-09-11 NOTE — Progress Notes (Signed)
Patient ID: Charles Floyd, male   DOB: 07-Jan-1951, 66 y.o.   MRN: [id number]  Chief Complaint  Patient presents with  . Other    gallstones    HPI Charles Floyd is a 66 y.o. male.  Here for evaluation of gallstones referred by Dr Caryn Section. He states for about a month he has had nausea but no vomiting. No abdominal pain but bloated feeling.  He states that he has noticed that lettuce has given him diarrhea for about 6 months. He states that for 2 weeks he has had constipation. He states he feels "blah". He does recall his urine being a dark orange color but has since cleared. He has felt some better since taking the Cipro which he completed 2 weeks ago.  Ultrasound was 08-30-17.  The patient reports he has been unable to acclimate himself to his CPAP unit.  HPI  Past Medical History:  Diagnosis Date  . Cancer of prostate [facility]) 2012   Robotic Assisted prostatectomy 2012 [facility]  . Gout   . Gout   . History of chicken pox   . History of measles   . History of mumps   . Sleep apnea    using CPAP occasionally     Past Surgical History:  Procedure Laterality Date  . COLONOSCOPY  Jan 2003   Dr Bary Castilla  . kidney stone extraction  07/2013   stent placed  . PROSTATE SURGERY  2012  . PROSTATECTOMY  05/16/2011   [facility]. Abdominal Laparoscopic, robotic assisted    Family History  Problem Relation Age of Onset  . Cancer Mother        colon and breast  . CAD Mother   . Anemia Mother   . Colon cancer Mother   . Congestive Heart Failure Father   . Tuberculosis Father   . CAD Father   . Emphysema Father     Social History Social History  Substance Use Topics  . Smoking status: Never Smoker  . Smokeless tobacco: Never Used  . Alcohol use 0.0 oz/week     Comment: occasional use    No Known Allergies  Current Outpatient Prescriptions  Medication Sig Dispense Refill  . allopurinol (ZYLOPRIM) 300 MG tablet Take 1 tablet (300 mg total) by mouth daily. 30 tablet 6   . triamcinolone cream (KENALOG) 0.1 % Apply topically as needed.      No current facility-administered medications for this visit.     Review of Systems Review of Systems  Constitutional: Negative.   Respiratory: Negative.   Cardiovascular: Negative.   Gastrointestinal: Positive for constipation, diarrhea and nausea. Negative for vomiting.    Blood pressure (!) 142/80, pulse 76, resp. rate 16, height 5\' 11"  (1.803 m), weight (!) 342 lb (155.1 kg).  Physical Exam Physical Exam  Constitutional: He is oriented to person, place, and time. He appears well-developed and well-nourished.  HENT:  Mouth/Throat: Oropharynx is clear and moist.  Eyes: Conjunctivae are normal. No scleral icterus.  Neck: Neck supple.  Cardiovascular: Normal rate, regular rhythm and normal heart sounds.   Pulmonary/Chest: Effort normal and breath sounds normal.  Abdominal: Soft. Normal appearance and bowel sounds are normal. There is no tenderness.  Lymphadenopathy:    He has no cervical adenopathy.  Neurological: He is alert and oriented to person, place, and time.  Skin: Skin is warm and dry.  Psychiatric: His behavior is normal.    Data Reviewed 08/30/2017 abdominal ultrasound: IMPRESSION: Cholelithiasis. Changes of adenomyomatosis. Focal gallbladder wall  thickening, likely related to chronic cholecystitis.  Fatty infiltration of the liver.  No focal abnormality.  Laboratory studies of 08/23/2017 showed normal serum lipase and amylase. Comprehensive metabolic panel showed a bilirubin of 2.1 with an alkaline phosphatase of 70, minimal elevation of the AST and ALT at 53 and 54 respectively. CBC of the same date showed a white blood cell count of 18,000 with a left shift, hemoglobin 15.3 with a platelet count 283,000.  Assessment    Biliary tract disease, likely passed common bile duct stone.    Plan         Laparoscopic Cholecystectomy with Intraoperative Cholangiogram. The procedure,  including it's potential risks and complications (including but not limited to infection, bleeding, injury to intra-abdominal organs or bile ducts, bile leak, poor cosmetic result, sepsis and death) were discussed with the patient in detail. Non-operative options, including their inherent risks (acute calculous cholecystitis with possible choledocholithiasis or gallstone pancreatitis, with the risk of ascending cholangitis, sepsis, and death) were discussed as well. The patient expressed and understanding of what we discussed and wishes to proceed with laparoscopic cholecystectomy. The patient further understands that if it is technically not possible, or it is unsafe to proceed laparoscopically, that I will convert to an open cholecystectomy.  HPI, Physical Exam, Assessment and Plan have been scribed under the direction and in the presence of Robert Bellow, MD. Karie Fetch, RN  The patient is scheduled for surgery at [facility] on 09/18/17. He  Will pre admit by phone. The patient is aware of date and instructions.  Documented by Lesly Rubenstein LPN  Robert Bellow 09/11/2017, 9:42 PM

## 2017-09-11 NOTE — Patient Instructions (Addendum)

## 2017-09-13 ENCOUNTER — Ambulatory Visit: Payer: Self-pay | Admitting: General Surgery

## 2017-09-17 ENCOUNTER — Encounter
Admission: RE | Admit: 2017-09-17 | Discharge: 2017-09-17 | Disposition: A | Discharge status: Home / Self Care | Payer: Medicare Other | Source: Ambulatory Visit | Attending: General Surgery | Admitting: General Surgery

## 2017-09-17 DIAGNOSIS — Z01810 Encounter for preprocedural cardiovascular examination: Secondary | ICD-10-CM | POA: Diagnosis not present

## 2017-09-17 HISTORY — DX: Panic disorder (episodic paroxysmal anxiety): F41.0

## 2017-09-17 HISTORY — DX: Unspecified asthma, uncomplicated: J45.909

## 2017-09-17 HISTORY — DX: Personal history of urinary calculi: Z87.442

## 2017-09-17 HISTORY — DX: Unspecified osteoarthritis, unspecified site: M19.90

## 2017-09-17 MED ORDER — DEXTROSE 5 % IV SOLN
3.0000 g | INTRAVENOUS | Status: AC
  Administered 2017-09-18: 3 g via INTRAVENOUS
  Filled 2017-09-17: qty 3000

## 2017-09-17 NOTE — Patient Instructions (Signed)
Your procedure is scheduled on: 09-18-17 Report to Same Day Surgery 2nd floor medical mall [facility] Entrance-take elevator on left to 2nd floor.  Check in with surgery information desk.) To find out your arrival time please call [phone] between 1PM - 3PM on 09-17-17  Remember: Instructions that are not followed completely may result in serious medical risk, up to and including death, or upon the discretion of your surgeon and anesthesiologist your surgery may need to be rescheduled.    _x___ 1. Do not eat food after midnight the night before your procedure. No gum chewing or hard candies.  You may drink clear liquids up to 2 hours before you are scheduled to arrive at the hospital for your procedure.  Do not drink clear liquids within 2 hours of your scheduled arrival to the hospital.  Clear liquids include  --Water or Apple juice without pulp  --Clear carbohydrate beverage such as ClearFast or Gatorade  --Black Coffee or Clear Tea (No milk, no creamers, do not add anything to the coffee or Tea)  Type 1 and type 2 diabetics should only drink water.       __x__ 2. No Alcohol for 24 hours before or after surgery.   __x__3. No Smoking for 24 prior to surgery.   ____  4. Bring all medications with you on the day of surgery if instructed.    __x__ 5. Notify your doctor if there is any change in your medical condition     (cold, fever, infections).     Do not wear jewelry, make-up, hairpins, clips or nail polish.  Do not wear lotions, powders, or perfumes. You may wear deodorant.  Do not shave 48 hours prior to surgery. Men may shave face and neck.  Do not bring valuables to the hospital.    [facility] is not responsible for any belongings or valuables.               Contacts, dentures or bridgework may not be worn into surgery.  Leave your suitcase in the car. After surgery it may be brought to your room.  For patients admitted to the hospital, discharge time is determined  by your treatment team.   Patients discharged the day of surgery will not be allowed to drive home.  You will need someone to drive you home and stay with you the night of your procedure.    Please read over the following fact sheets that you were given:   [facility]paring for [facility] and or MRSA Information   _x___ TAKE THE FOLLOWING MEDICATIONS THE MORNING OF SURGERY WITH A SMALL SIP OF WATER. These include:  1. ALLOPURINOL  2.  3.  4.  5.  6.  ____Fleets enema or Magnesium Citrate as directed.   ____ Use CHG Soap or sage wipes as directed on instruction sheet   ____ Use inhalers on the day of surgery and bring to hospital day of surgery  ____ Stop Metformin and Janumet 2 days prior to surgery.    ____ Take 1/2 of usual insulin dose the night before surgery and none on the morning surgery.   ____ Follow recommendations from Cardiologist, Pulmonologist or PCP regarding  stopping Aspirin, Coumadin, Plavix ,Eliquis, Effient, or Pradaxa, and Pletal.  ____Stop Anti-inflammatories such as Advil, Aleve, Ibuprofen, Motrin, Naproxen, Naprosyn, Goodies powders or aspirin products. OK to take Tylenol                ____ Stop supplements until after  surgery.     _X___ Bring C-Pap to the hospital.

## 2017-09-18 ENCOUNTER — Ambulatory Visit: Payer: Medicare Other | Admitting: Certified Registered"

## 2017-09-18 ENCOUNTER — Encounter
Admission: RE | Disposition: A | Discharge status: Home / Self Care | Payer: Self-pay | Source: Ambulatory Visit | Attending: General Surgery

## 2017-09-18 ENCOUNTER — Observation Stay
Admission: RE | Admit: 2017-09-18 | Discharge: 2017-09-19 | Disposition: A | Discharge status: Home / Self Care | Payer: Medicare Other | Source: Ambulatory Visit | Attending: General Surgery | Admitting: General Surgery

## 2017-09-18 ENCOUNTER — Ambulatory Visit: Payer: Medicare Other

## 2017-09-18 ENCOUNTER — Encounter: Payer: Self-pay | Admitting: *Deleted

## 2017-09-18 DIAGNOSIS — K8020 Calculus of gallbladder without cholecystitis without obstruction: Secondary | ICD-10-CM

## 2017-09-18 DIAGNOSIS — Z6841 Body Mass Index (BMI) 40.0 and over, adult: Secondary | ICD-10-CM | POA: Insufficient documentation

## 2017-09-18 DIAGNOSIS — K8010 Calculus of gallbladder with chronic cholecystitis without obstruction: Secondary | ICD-10-CM | POA: Diagnosis not present

## 2017-09-18 DIAGNOSIS — Z23 Encounter for immunization: Secondary | ICD-10-CM | POA: Diagnosis not present

## 2017-09-18 DIAGNOSIS — G4730 Sleep apnea, unspecified: Secondary | ICD-10-CM | POA: Diagnosis not present

## 2017-09-18 DIAGNOSIS — K8019 Calculus of gallbladder with other cholecystitis with obstruction: Secondary | ICD-10-CM | POA: Diagnosis not present

## 2017-09-18 DIAGNOSIS — F419 Anxiety disorder, unspecified: Secondary | ICD-10-CM | POA: Diagnosis not present

## 2017-09-18 DIAGNOSIS — K819 Cholecystitis, unspecified: Secondary | ICD-10-CM | POA: Diagnosis present

## 2017-09-18 HISTORY — PX: CHOLECYSTECTOMY: SHX55

## 2017-09-18 LAB — HEPATIC FUNCTION PANEL
ALT: 21 U/L (ref 17–63)
AST: 24 U/L (ref 15–41)
Albumin: 3.5 g/dL (ref 3.5–5.0)
Alkaline Phosphatase: 53 U/L (ref 38–126)
BILIRUBIN DIRECT: 0.1 mg/dL (ref 0.1–0.5)
BILIRUBIN TOTAL: 0.9 mg/dL (ref 0.3–1.2)
Indirect Bilirubin: 0.8 mg/dL (ref 0.3–0.9)
Total Protein: 6.9 g/dL (ref 6.5–8.1)

## 2017-09-18 SURGERY — LAPAROSCOPIC CHOLECYSTECTOMY WITH INTRAOPERATIVE CHOLANGIOGRAM
Anesthesia: General | Wound class: Contaminated

## 2017-09-18 MED ORDER — PROMETHAZINE HCL 25 MG/ML IJ SOLN: 12.5000 mg | INTRAMUSCULAR | Status: DC | PRN

## 2017-09-18 MED ORDER — FAMOTIDINE 20 MG PO TABS
ORAL_TABLET | ORAL | Status: AC
  Filled 2017-09-18: qty 1

## 2017-09-18 MED ORDER — ROCURONIUM BROMIDE 50 MG/5ML IV SOLN
INTRAVENOUS | Status: AC
  Filled 2017-09-18: qty 1

## 2017-09-18 MED ORDER — ENOXAPARIN SODIUM 40 MG/0.4ML ~~LOC~~ SOLN
40.0000 mg | SUBCUTANEOUS | Status: DC
  Administered 2017-09-19: 40 mg via SUBCUTANEOUS
  Filled 2017-09-18: qty 0.4

## 2017-09-18 MED ORDER — EPHEDRINE SULFATE 50 MG/ML IJ SOLN
INTRAMUSCULAR | Status: DC | PRN
  Administered 2017-09-18: 10 mg via INTRAVENOUS

## 2017-09-18 MED ORDER — MIDAZOLAM HCL 2 MG/2ML IJ SOLN
INTRAMUSCULAR | Status: DC | PRN
  Administered 2017-09-18: 2 mg via INTRAVENOUS

## 2017-09-18 MED ORDER — GLYCOPYRROLATE 0.2 MG/ML IJ SOLN
INTRAMUSCULAR | Status: DC | PRN
  Administered 2017-09-18: 0.2 mg via INTRAVENOUS

## 2017-09-18 MED ORDER — FENTANYL CITRATE (PF) 250 MCG/5ML IJ SOLN
INTRAMUSCULAR | Status: AC
  Filled 2017-09-18: qty 5

## 2017-09-18 MED ORDER — FENTANYL CITRATE (PF) 100 MCG/2ML IJ SOLN
25.0000 ug | INTRAMUSCULAR | Status: DC | PRN
  Administered 2017-09-18 (×4): 25 ug via INTRAVENOUS

## 2017-09-18 MED ORDER — KETOROLAC TROMETHAMINE 30 MG/ML IJ SOLN
INTRAMUSCULAR | Status: DC | PRN
Start: 2017-09-18 — End: 2017-09-18
  Administered 2017-09-18: 30 mg via INTRAVENOUS

## 2017-09-18 MED ORDER — IOTHALAMATE MEGLUMINE 60 % INJ SOLN
INTRAMUSCULAR | Status: DC | PRN
  Administered 2017-09-18: 20 mL

## 2017-09-18 MED ORDER — LACTATED RINGERS IV SOLN
INTRAVENOUS | Status: DC
  Administered 2017-09-18 – 2017-09-19 (×2): via INTRAVENOUS

## 2017-09-18 MED ORDER — HYDROCODONE-ACETAMINOPHEN 5-325 MG PO TABS
1.0000 | ORAL_TABLET | ORAL | Status: DC | PRN
  Administered 2017-09-18 (×2): 1 via ORAL
  Filled 2017-09-18 (×2): qty 1

## 2017-09-18 MED ORDER — ACETAMINOPHEN 10 MG/ML IV SOLN
INTRAVENOUS | Status: AC
  Filled 2017-09-18: qty 100

## 2017-09-18 MED ORDER — ONDANSETRON HCL 4 MG/2ML IJ SOLN
INTRAMUSCULAR | Status: AC
  Filled 2017-09-18: qty 2

## 2017-09-18 MED ORDER — SUGAMMADEX SODIUM 200 MG/2ML IV SOLN
INTRAVENOUS | Status: AC
  Filled 2017-09-18: qty 2

## 2017-09-18 MED ORDER — MIDAZOLAM HCL 2 MG/2ML IJ SOLN
INTRAMUSCULAR | Status: AC
  Filled 2017-09-18: qty 2

## 2017-09-18 MED ORDER — PHENYLEPHRINE HCL 10 MG/ML IJ SOLN
INTRAMUSCULAR | Status: DC | PRN
  Administered 2017-09-18 (×5): 100 ug via INTRAVENOUS

## 2017-09-18 MED ORDER — FENTANYL CITRATE (PF) 100 MCG/2ML IJ SOLN
INTRAMUSCULAR | Status: AC
  Administered 2017-09-18: 25 ug via INTRAVENOUS
  Filled 2017-09-18: qty 2

## 2017-09-18 MED ORDER — ROCURONIUM BROMIDE 100 MG/10ML IV SOLN
INTRAVENOUS | Status: DC | PRN
  Administered 2017-09-18: 10 mg via INTRAVENOUS
  Administered 2017-09-18: 40 mg via INTRAVENOUS
  Administered 2017-09-18: 10 mg via INTRAVENOUS

## 2017-09-18 MED ORDER — MORPHINE SULFATE (PF) 2 MG/ML IV SOLN: 2.0000 mg | INTRAVENOUS | Status: DC | PRN

## 2017-09-18 MED ORDER — SODIUM CHLORIDE 0.9 % IJ SOLN
INTRAMUSCULAR | Status: AC
  Filled 2017-09-18: qty 50

## 2017-09-18 MED ORDER — PROPOFOL 10 MG/ML IV BOLUS
INTRAVENOUS | Status: DC | PRN
  Administered 2017-09-18: 200 mg via INTRAVENOUS

## 2017-09-18 MED ORDER — GLYCOPYRROLATE 0.2 MG/ML IJ SOLN
INTRAMUSCULAR | Status: AC
  Filled 2017-09-18: qty 1

## 2017-09-18 MED ORDER — LIDOCAINE HCL (CARDIAC) 20 MG/ML IV SOLN
INTRAVENOUS | Status: DC | PRN
  Administered 2017-09-18: 50 mg via INTRAVENOUS

## 2017-09-18 MED ORDER — FENTANYL CITRATE (PF) 100 MCG/2ML IJ SOLN
INTRAMUSCULAR | Status: DC | PRN
  Administered 2017-09-18: 50 ug via INTRAVENOUS
  Administered 2017-09-18: 100 ug via INTRAVENOUS
  Administered 2017-09-18 (×2): 50 ug via INTRAVENOUS

## 2017-09-18 MED ORDER — ACETAMINOPHEN 10 MG/ML IV SOLN
INTRAVENOUS | Status: DC | PRN
  Administered 2017-09-18: 1000 mg via INTRAVENOUS

## 2017-09-18 MED ORDER — LIDOCAINE HCL (PF) 2 % IJ SOLN
INTRAMUSCULAR | Status: AC
  Filled 2017-09-18: qty 10

## 2017-09-18 MED ORDER — ONDANSETRON HCL 4 MG/2ML IJ SOLN: 4.0000 mg | Freq: Once | INTRAMUSCULAR | Status: DC | PRN

## 2017-09-18 MED ORDER — DEXAMETHASONE SODIUM PHOSPHATE 10 MG/ML IJ SOLN
INTRAMUSCULAR | Status: DC | PRN
  Administered 2017-09-18: 5 mg via INTRAVENOUS

## 2017-09-18 MED ORDER — KETOROLAC TROMETHAMINE 30 MG/ML IJ SOLN
INTRAMUSCULAR | Status: AC
  Filled 2017-09-18: qty 1

## 2017-09-18 MED ORDER — EPHEDRINE SULFATE 50 MG/ML IJ SOLN
INTRAMUSCULAR | Status: AC
  Filled 2017-09-18: qty 1

## 2017-09-18 MED ORDER — LACTATED RINGERS IV SOLN
INTRAVENOUS | Status: DC
  Administered 2017-09-18: 10:00:00 via INTRAVENOUS

## 2017-09-18 MED ORDER — INFLUENZA VAC SPLIT HIGH-DOSE 0.5 ML IM SUSY
0.5000 mL | PREFILLED_SYRINGE | INTRAMUSCULAR | Status: AC
  Administered 2017-09-19: 0.5 mL via INTRAMUSCULAR
  Filled 2017-09-18: qty 0.5

## 2017-09-18 MED ORDER — BISACODYL 5 MG PO TBEC: 10.0000 mg | DELAYED_RELEASE_TABLET | Freq: Every day | ORAL | Status: DC | PRN

## 2017-09-18 MED ORDER — PNEUMOCOCCAL VAC POLYVALENT 25 MCG/0.5ML IJ INJ
0.5000 mL | INJECTION | INTRAMUSCULAR | Status: AC
  Administered 2017-09-19: 0.5 mL via INTRAMUSCULAR
  Filled 2017-09-18: qty 0.5

## 2017-09-18 MED ORDER — ALLOPURINOL 100 MG PO TABS
300.0000 mg | ORAL_TABLET | Freq: Every morning | ORAL | Status: DC
  Administered 2017-09-18 – 2017-09-19 (×2): 300 mg via ORAL
  Filled 2017-09-18 (×2): qty 3

## 2017-09-18 MED ORDER — SUCCINYLCHOLINE CHLORIDE 20 MG/ML IJ SOLN
INTRAMUSCULAR | Status: DC | PRN
  Administered 2017-09-18: 100 mg via INTRAVENOUS

## 2017-09-18 MED ORDER — PROPOFOL 10 MG/ML IV BOLUS
INTRAVENOUS | Status: AC
  Filled 2017-09-18: qty 20

## 2017-09-18 MED ORDER — SUGAMMADEX SODIUM 200 MG/2ML IV SOLN
INTRAVENOUS | Status: DC | PRN
  Administered 2017-09-18: 300 mg via INTRAVENOUS

## 2017-09-18 MED ORDER — ONDANSETRON HCL 4 MG/2ML IJ SOLN
INTRAMUSCULAR | Status: DC | PRN
  Administered 2017-09-18: 4 mg via INTRAVENOUS

## 2017-09-18 MED ORDER — FAMOTIDINE 20 MG PO TABS
20.0000 mg | ORAL_TABLET | Freq: Once | ORAL | Status: AC
  Administered 2017-09-18: 20 mg via ORAL

## 2017-09-18 MED ORDER — ACETAMINOPHEN 325 MG PO TABS
650.0000 mg | ORAL_TABLET | ORAL | Status: DC | PRN
Start: 2017-09-18 — End: 2017-09-19

## 2017-09-18 SURGICAL SUPPLY — 44 items
APPLIER CLIP ROT 10 11.4 M/L (STAPLE) ×3
BLADE SURG 11 STRL SS SAFETY (MISCELLANEOUS) ×3 IMPLANT
BULB RESERV EVAC DRAIN JP 100C (MISCELLANEOUS) ×3 IMPLANT
CANISTER SUCT 1200ML W/VALVE (MISCELLANEOUS) ×3 IMPLANT
CANNULA DILATOR  5MM W/SLV (CANNULA) ×3
CANNULA DILATOR 10 W/SLV (CANNULA) ×2 IMPLANT
CANNULA DILATOR 10MM W/SLV (CANNULA) ×1
CANNULA DILATOR 5 W/SLV (CANNULA) ×6 IMPLANT
CATH CHOLANG 76X19 KUMAR (CATHETERS) ×3 IMPLANT
CHLORAPREP W/TINT 26ML (MISCELLANEOUS) ×3 IMPLANT
CLIP APPLIE ROT 10 11.4 M/L (STAPLE) ×1 IMPLANT
CLOSURE WOUND 1/2 X4 (GAUZE/BANDAGES/DRESSINGS) ×1
CONRAY 60ML FOR OR (MISCELLANEOUS) ×3 IMPLANT
DEVICE PMI PUNCTURE CLOSURE (MISCELLANEOUS) ×3 IMPLANT
DISSECTOR KITTNER STICK (MISCELLANEOUS) ×1 IMPLANT
DISSECTORS/KITTNER STICK (MISCELLANEOUS) ×3
DRAIN CHANNEL JP 15F RND 16 (MISCELLANEOUS) ×3 IMPLANT
DRAPE SHEET LG 3/4 BI-LAMINATE (DRAPES) ×3 IMPLANT
DRSG TEGADERM 2-3/8X2-3/4 SM (GAUZE/BANDAGES/DRESSINGS) ×15 IMPLANT
DRSG TELFA 4X3 1S NADH ST (GAUZE/BANDAGES/DRESSINGS) ×3 IMPLANT
ELECT REM PT RETURN 9FT ADLT (ELECTROSURGICAL) ×3
ELECTRODE REM PT RTRN 9FT ADLT (ELECTROSURGICAL) ×1 IMPLANT
GLOVE BIO SURGEON STRL SZ7.5 (GLOVE) ×9 IMPLANT
GLOVE INDICATOR 8.0 STRL GRN (GLOVE) ×9 IMPLANT
GOWN STRL REUS W/ TWL LRG LVL3 (GOWN DISPOSABLE) ×3 IMPLANT
GOWN STRL REUS W/TWL LRG LVL3 (GOWN DISPOSABLE) ×6
IRRIGATION STRYKERFLOW (MISCELLANEOUS) ×1 IMPLANT
IRRIGATOR STRYKERFLOW (MISCELLANEOUS) ×3
IV LACTATED RINGERS 1000ML (IV SOLUTION) ×3 IMPLANT
KIT RM TURNOVER STRD PROC AR (KITS) ×3 IMPLANT
LABEL OR SOLS (LABEL) ×3 IMPLANT
NDL INSUFF ACCESS 14 VERSASTEP (NEEDLE) ×3 IMPLANT
NS IRRIG 500ML POUR BTL (IV SOLUTION) ×3 IMPLANT
PACK LAP CHOLECYSTECTOMY (MISCELLANEOUS) ×3 IMPLANT
POUCH SPECIMEN RETRIEVAL 10MM (ENDOMECHANICALS) ×3 IMPLANT
SCISSORS METZENBAUM CVD 33 (INSTRUMENTS) ×3 IMPLANT
STRIP CLOSURE SKIN 1/2X4 (GAUZE/BANDAGES/DRESSINGS) ×2 IMPLANT
SUT ETHILON 3-0 (SUTURE) ×3 IMPLANT
SUT VIC AB 0 CT2 27 (SUTURE) ×3 IMPLANT
SUT VIC AB 4-0 FS2 27 (SUTURE) ×3 IMPLANT
SWABSTK COMLB BENZOIN TINCTURE (MISCELLANEOUS) ×3 IMPLANT
TROCAR XCEL NON-BLD 11X100MML (ENDOMECHANICALS) ×3 IMPLANT
TUBING INSUFFLATOR HI FLOW (MISCELLANEOUS) ×3 IMPLANT
WATER STERILE IRR 1000ML POUR (IV SOLUTION) ×3 IMPLANT

## 2017-09-18 NOTE — Op Note (Signed)
Preoperative diagnosis: Chronic cholecystitis and cholelithiasis.  Postoperative diagnosis: Subacute and chronic cholecystitis and cholelithiasis.  Operative procedure: Laparoscopic cholecystectomy with attempted cholangiograms.  Operating surgeon: Ollen Bowl, M.D.  Anesthesia: Gen. endotracheal.  Estimated blood loss: 50-75 mL.  Clinical note: This 66 year old male has recently had right upper quadrant pain ultrasound showed evidence of cholelithiasis. Marked thickening of the gallbladder wall with a suggestion of pericholecystic fluid was also noted. Laboratory studies obtained on 08/23/2017 showed a bilirubin of 2.1 with a modest elevation of the serum transaminases. Normal lipase. Laboratory studies obtained this morning were entirely normal. The patient is brought to the hospital for elective cholecystectomy. He received Kefzol, 3 g on induction of anesthesia. SCD stockings for DVT prevention.  Operative note: With the patient under adequate general endotracheal anesthesia hair was removed from the abdomen with clippers and the skin was cleansed with ChloraPrep and draped. An Trendelenburg position a Barry's needle was placed return to umbilical incision. After sharing intra-abdominal location with the hanging drop test the abdomen was insufflated with CO2 a 10 mmHg pressure. A 10 mm Port was expanded and inspection showed no evidence of injury from initial port placement. The patient was placed in reverse Trendelenburg position and rolled to the left. The gallbladder was not immediately visible. An 11 mm XL port was placed in the epigastrium and 2-5 mm Ports were placed in the right lateral abdominal wall. With dissection the very top of the fundus of the gallbladder could be identified. This was placed on cephalad traction with essentially no movement due to the adhesions of the underlying omental adhesions. A fifth port was placed in the right anterior axillary line. This was a 5 mm Port.  A Peerretractor was used to distract the omentum and duodenum inferiorly. An oral gastric tube was placed by the nurse anesthetist with improved visualization. The 0 scope was replaced with a 30 scope with marked improvement in visualization. A tedious dissection of the omentum to the gallbladder and then sweeping the duodenum away from the neck of the gallbladder was undertaken. There was marked inflammation at the neck of the gallbladder.    A Kumar clamp was placed and fluoroscopic cholangiograms attempted. 20 mL of one half strength Conray 60 was used and filled the gallbladder identify multiple large stones. No passage through the cystic duct. The area of the cystic artery and the cystic duct were cleared to the point where there was no evidence of additional ductal structures in the area. The cystic duct were doubly clipped and divided adjacent to the gallbladder wall and the branches of the cystic artery were treated in a similar fashion. The gallbladder was removed from the liver bed with hook cautery dissection. In several areas there the gallbladder wall was welded to the liver bed and this had to be separately dissected free with cautery dissection. Several stones escaped the gallbladder which had been decompressed to allow it to be grasped and at the end the retrieved and placed into the Endo Catch bag with the gallbladder for delivery out of the abdomen. The Endo Catch bag was delivered to the umbilical port site and it was necessary to incise the fascia to extract the thickened gallbladder and multiple stones. After reestablishing pneumoperitoneum inspection from the epigastric site showed no evidence of injury from initial port placement. The right upper quadrant was irrigated and no bleeding was noted. It was elected to place a Blake drain into the gallbladder bed and this was brought out through the right  flank port site and anchored into position with a 3-0 nylon suture. This was later placed  to closed suction.  The fascia at the umbilicus was closed with a 0 Vicryl suture making use of a "cone" and the PTS suture passer.  The abdomen was irrigated with lactated Ringer solution and ports were removed and the abdomen decompressed under direct vision.  Skin incisions were closed with 4-0 Vicryl subcuticular sutures. Benzoin, Steri-Strips, Telfa and Tegaderm dressings were applied.  The patient tolerated the procedure well and was taken to the cover in stable condition. He was admitted to overnight observation due to the length of the procedure.

## 2017-09-18 NOTE — H&P (Signed)
No change in clinical history or exam. For cholecystectomy.  

## 2017-09-18 NOTE — Anesthesia Procedure Notes (Signed)
Procedure Name: Intubation Performed by: Rolla Plate Pre-anesthesia Checklist: Patient identified, Patient being monitored, Timeout performed, Emergency Drugs available and Suction available Patient Re-evaluated:Patient Re-evaluated prior to induction Oxygen Delivery Method: Circle system utilized Preoxygenation: Pre-oxygenation with 100% oxygen Induction Type: IV induction and Rapid sequence Ventilation: Mask ventilation without difficulty, Mask ventilation throughout procedure and Two handed mask ventilation required Laryngoscope Size: 4 and McGraph Grade View: Grade III Tube type: Oral Tube size: 7.5 mm Number of attempts: 2 Airway Equipment and Method: Stylet,  Video-laryngoscopy and Patient positioned with wedge pillow Placement Confirmation: ETT inserted through vocal cords under direct vision,  positive ETCO2 and breath sounds checked- equal and bilateral Secured at: 23 cm Tube secured with: Tape Dental Injury: Teeth and Oropharynx as per pre-operative assessment  Difficulty Due To: Difficulty was anticipated, Difficult Airway- due to large tongue, Difficult Airway- due to reduced neck mobility and Difficult Airway- due to anterior larynx Future Recommendations: Recommend- induction with short-acting agent, and alternative techniques readily available

## 2017-09-18 NOTE — Transfer of Care (Signed)
Immediate Anesthesia Transfer of Care Note  Patient: Charles Floyd  Procedure(s) Performed: LAPAROSCOPIC CHOLECYSTECTOMY WITH INTRAOPERATIVE CHOLANGIOGRAM (N/A )  Patient Location: PACU  Anesthesia Type:General  Level of Consciousness: awake  Airway & Oxygen Therapy: Patient Spontanous Breathing and Patient connected to face mask oxygen  Post-op Assessment: Report given to RN and Post -op Vital signs reviewed and stable  Post vital signs: Reviewed  Last Vitals:  Vitals:   09/18/17 0916 09/18/17 1228  BP: 105/74 (!) 122/53  Pulse: 79 (!) 102  Resp: 18 13  Temp: 37.6 C   SpO2: 96% 98%    Last Pain:  Vitals:   09/18/17 0916  TempSrc: Oral         Complications: No apparent anesthesia complications

## 2017-09-18 NOTE — Anesthesia Preprocedure Evaluation (Signed)
Anesthesia Evaluation  Patient identified by MRN, date of birth, ID band Patient awake    Reviewed: Allergy & Precautions, NPO status , Patient's Chart, lab work & pertinent test results  Airway Mallampati: II       Dental  (+) Teeth Intact   Pulmonary sleep apnea and Continuous Positive Airway Pressure Ventilation ,     + decreased breath sounds      Cardiovascular Exercise Tolerance: Good  Rhythm:Regular Rate:Normal     Neuro/Psych Anxiety negative neurological ROS     GI/Hepatic negative GI ROS, Neg liver ROS,   Endo/Other  negative endocrine ROSMorbid obesity  Renal/GU negative Renal ROS  negative genitourinary   Musculoskeletal   Abdominal (+) + obese,   Peds negative pediatric ROS (+)  Hematology   Anesthesia Other Findings   Reproductive/Obstetrics                             Anesthesia Physical Anesthesia Plan  ASA: III  Anesthesia Plan: General   Post-op Pain Management:    Induction: Intravenous  PONV Risk Score and Plan:   Airway Management Planned: Oral ETT  Additional Equipment:   Intra-op Plan:   Post-operative Plan: Extubation in OR  Informed Consent: I have reviewed the patients History and Physical, chart, labs and discussed the procedure including the risks, benefits and alternatives for the proposed anesthesia with the patient or authorized representative who has indicated his/her understanding and acceptance.     Plan Discussed with: CRNA  Anesthesia Plan Comments:         Anesthesia Quick Evaluation

## 2017-09-18 NOTE — Anesthesia Postprocedure Evaluation (Signed)
Anesthesia Post Note  Patient: Charles Floyd  Procedure(s) Performed: LAPAROSCOPIC CHOLECYSTECTOMY WITH INTRAOPERATIVE CHOLANGIOGRAM (N/A )  Patient location during evaluation: PACU Anesthesia Type: General Level of consciousness: awake Pain management: pain level controlled Vital Signs Assessment: post-procedure vital signs reviewed and stable Respiratory status: spontaneous breathing Cardiovascular status: stable Anesthetic complications: no     Last Vitals:  Vitals:   09/18/17 1328 09/18/17 1348  BP: 115/72 117/69  Pulse: 81 87  Resp: 15 17  Temp: 36.6 C (!) 36.4 C  SpO2: 93% 95%    Last Pain:  Vitals:   09/18/17 1352  TempSrc:   PainSc: 7                  VAN STAVEREN,Cashawn Yanko

## 2017-09-18 NOTE — Anesthesia Post-op Follow-up Note (Signed)
Anesthesia QCDR form completed.        

## 2017-09-19 DIAGNOSIS — Z6841 Body Mass Index (BMI) 40.0 and over, adult: Secondary | ICD-10-CM | POA: Diagnosis not present

## 2017-09-19 DIAGNOSIS — K8010 Calculus of gallbladder with chronic cholecystitis without obstruction: Secondary | ICD-10-CM | POA: Diagnosis not present

## 2017-09-19 DIAGNOSIS — G4730 Sleep apnea, unspecified: Secondary | ICD-10-CM | POA: Diagnosis not present

## 2017-09-19 DIAGNOSIS — Z23 Encounter for immunization: Secondary | ICD-10-CM | POA: Diagnosis not present

## 2017-09-19 LAB — HEPATIC FUNCTION PANEL
ALT: 25 U/L (ref 17–63)
AST: 32 U/L (ref 15–41)
Albumin: 3.2 g/dL — ABNORMAL LOW (ref 3.5–5.0)
Alkaline Phosphatase: 49 U/L (ref 38–126)
BILIRUBIN DIRECT: 0.2 mg/dL (ref 0.1–0.5)
BILIRUBIN TOTAL: 0.7 mg/dL (ref 0.3–1.2)
Indirect Bilirubin: 0.5 mg/dL (ref 0.3–0.9)
Total Protein: 6.6 g/dL (ref 6.5–8.1)

## 2017-09-19 MED ORDER — HYDROCODONE-ACETAMINOPHEN 5-325 MG PO TABS: 1.0000 | ORAL_TABLET | ORAL | 0 refills | Status: DC | PRN

## 2017-09-19 MED ORDER — INFLUENZA VAC SPLIT HIGH-DOSE 0.5 ML IM SUSY: 0.5000 mL | PREFILLED_SYRINGE | INTRAMUSCULAR | 0 refills | Status: AC

## 2017-09-19 NOTE — Progress Notes (Signed)
Pt discharged per MD order. IV removed. Pt given prescription with instruction to take it to Dr. Curly Shores office to have it signed. Discharge paperwork reviewed with pt. Pt understanding to wound care. Pt taken to car in wheelchair via volunteer.

## 2017-09-19 NOTE — Final Progress Note (Signed)
AVSS. Feeling well. Lungs: Clear. Inspirex at > 2000. ABD: Soft. Drain: Serous/ sero sang. No bile stain. Labs: LFT's normal. Plan: D.C home.

## 2017-09-19 NOTE — Care Management Obs Status (Signed)
[place] NOTIFICATION   Patient Details  Name: Charles Floyd MRN: [id number] Date of Birth: Jul 21, 1951   Medicare Observation Status Notification Given:  No (admitted obs less than 24 hours)    Beverly Sessions, RN 09/19/2017, 10:19 AM

## 2017-09-23 NOTE — Discharge Summary (Signed)
Physician Discharge Summary  Patient ID: Charles Floyd MRN: [id number] DOB/AGE: 02/25/1951 66 y.o.  Admit date: 09/18/2017 Discharge date: 09/23/2017  Admission Diagnoses: Subacute and chronic cholecystitis and cholelithiasis  Discharge Diagnoses:  Active Problems:   Cholecystitis Cholelithiasis  Discharged Condition: good  Hospital Course: The patient was admitted for overnight observation due to the length and difficulty of his cholecystectomy. Postoperatively the patient has shown minimal pain. No bile drainage from the [facility] drain place of the time of the procedure and normal liver function studies post procedure. Candidate for discharge home.  Consults: None  Significant Diagnostic Studies: labs: Postoperative liver function studies normal.  Treatments: IV fluids, observation.  Discharge Exam: Blood pressure 105/68, pulse 70, temperature 97.7 F (36.5 C), temperature source Oral, resp. rate 20, height 5\' 11"  (1.803 m), weight (!) 342 lb (155.1 kg), SpO2 94 %. General appearance: alert and cooperative Resp: clear to auscultation bilaterally Cardio: regular rate and rhythm, S1, S2 normal, no murmur, click, rub or gallop GI: soft, non-tender; bowel sounds normal; no masses,  no organomegaly Blake drain removed without incident. Serous/serosanguineous fluid noted.  Disposition: 01-Home or Self Care  Discharge Instructions    Diet - low sodium heart healthy    Complete by:  As directed    Discharge instructions    Complete by:  As directed    No driving until pain free. No lifting over 10 pounds. OK to shower at any time. Outer plastic dressings can be removed on Friday. Tylenol/ Advil/ Aleve: If needed for soreness. Norco (hydrocodone): If needed for pain. This medication may constipate. Laxative of choice.   Increase activity slowly    Complete by:  As directed      Allergies as of 09/19/2017   No Known Allergies     Medication List    TAKE these  medications   acetaminophen 650 MG CR tablet Commonly known as:  TYLENOL Take 1,300 mg by mouth every 8 (eight) hours as needed for pain.   allopurinol 300 MG tablet Commonly known as:  ZYLOPRIM Take 1 tablet (300 mg total) by mouth daily. What changed:  when to take this   bisacodyl 5 MG EC tablet Commonly known as:  DULCOLAX Take 10 mg by mouth daily as needed for mild constipation.   HYDROcodone-acetaminophen 5-325 MG tablet Commonly known as:  NORCO/VICODIN Take 1-2 tablets by mouth every 4 (four) hours as needed for moderate pain or severe pain. Take no more than 10 tablets daily.   triamcinolone cream 0.1 % Commonly known as:  KENALOG Apply 1 application topically daily as needed (atheletes foot).     ASK your doctor about these medications   Influenza vac split quadrivalent PF 0.5 ML injection Commonly known as:  FLUZONE HIGH-DOSE Inject 0.5 mLs into the muscle tomorrow at 10 am. Ask about: Should I take this medication?      Follow-up Information    Byrnett, Forest Gleason, MD. Go on 09/27/2017.   Specialties:  General Surgery, Radiology Why:  Thursday at [place] for follow-up  Contact information: [address] [place] [address] [phone]           Signed: Robert Bellow 09/23/2017, 9:57 AM

## 2017-09-24 LAB — SURGICAL PATHOLOGY

## 2017-09-27 ENCOUNTER — Ambulatory Visit (INDEPENDENT_AMBULATORY_CARE_PROVIDER_SITE_OTHER): Payer: Medicare Other | Admitting: General Surgery

## 2017-09-27 ENCOUNTER — Encounter: Payer: Self-pay | Admitting: General Surgery

## 2017-09-27 VITALS — BP 134/80 | HR 74 | Resp 14 | Ht 71.0 in | Wt 345.0 lb

## 2017-09-27 DIAGNOSIS — K8020 Calculus of gallbladder without cholecystitis without obstruction: Secondary | ICD-10-CM

## 2017-09-27 NOTE — Progress Notes (Signed)
Patient ID: Charles Floyd, male   DOB: Aug 06, 1951, 66 y.o.   MRN: [id number]  Chief Complaint  Patient presents with  . Routine Post Op    HPI Charles Floyd is a 66 y.o. male.  Here today for postoperative visit, he states he is doing well. Denies any gastrointestinal issues, bowels are finally moving regular.  HPI  Past Medical History:  Diagnosis Date  . Arthritis    BACK  . Asthma    AS A CHILD-NO INHALERS AS AN ADULT  . Cancer of prostate [facility]) 2012   Robotic Assisted prostatectomy 2012 [facility]  . Gout   . Gout   . History of chicken pox   . History of kidney stones    H/O  . History of measles   . History of mumps   . Panic attacks   . Sleep apnea    using CPAP occasionally     Past Surgical History:  Procedure Laterality Date  . CHOLECYSTECTOMY N/A 09/18/2017   Procedure: LAPAROSCOPIC CHOLECYSTECTOMY WITH INTRAOPERATIVE CHOLANGIOGRAM;  Surgeon: Robert Bellow, MD;  Location: [facility] ORS;  Service: General;  Laterality: N/A;  . COLONOSCOPY  Jan 2003   Dr Bary Castilla  . kidney stone extraction  07/2013   stent placed  . PROSTATE SURGERY  2012  . PROSTATECTOMY  05/16/2011   [facility]. Abdominal Laparoscopic, robotic assisted    Family History  Problem Relation Age of Onset  . Cancer Mother        colon and breast  . CAD Mother   . Anemia Mother   . Colon cancer Mother   . Congestive Heart Failure Father   . Tuberculosis Father   . CAD Father   . Emphysema Father     Social History Social History  Substance Use Topics  . Smoking status: Never Smoker  . Smokeless tobacco: Never Used  . Alcohol use 0.0 oz/week     Comment: occasional use    No Known Allergies  Current Outpatient Prescriptions  Medication Sig Dispense Refill  . acetaminophen (TYLENOL) 650 MG CR tablet Take 1,300 mg by mouth every 8 (eight) hours as needed for pain.    Marland Kitchen allopurinol (ZYLOPRIM) 300 MG tablet Take 1 tablet (300 mg total) by mouth daily. (Patient taking  differently: Take 300 mg by mouth every morning. ) 30 tablet 6  . bisacodyl (DULCOLAX) 5 MG EC tablet Take 10 mg by mouth daily as needed for mild constipation.    . triamcinolone cream (KENALOG) 0.1 % Apply 1 application topically daily as needed (atheletes foot).      No current facility-administered medications for this visit.     Review of Systems Review of Systems  Constitutional: Negative.   Respiratory: Negative.   Cardiovascular: Negative.     Blood pressure 134/80, pulse 74, resp. rate 14, height 5\' 11"  (1.803 m), weight (!) 345 lb (156.5 kg).  Physical Exam Physical Exam  Constitutional: He is oriented to person, place, and time. He appears well-developed and well-nourished.  Cardiovascular: Normal rate, regular rhythm and normal heart sounds.   Pulmonary/Chest: Effort normal and breath sounds normal.  Abdominal:    bruising below umbilical port site  Neurological: He is alert and oriented to person, place, and time.  Skin: Skin is warm and dry.  Psychiatric: His behavior is normal.    Data Reviewed DIAGNOSIS:  A. GALLBLADDER; CHOLECYSTECTOMY:  - CHOLELITHIASIS WITH XANTHOGRANULOMATOUS CHOLECYSTITIS.   Comment:  Additional sections are reviewed, and no neoplasm is identified.  The  gallbladder wall is disrupted in several areas by necrosis and  infiltrates of foamy macrophages. No vasculitis is seen. The necrosis  appears to be mostly fat necrosis, but special stains for acid-fast  organisms and fungi will be reviewed and the results reported in an  addendum.   AFB stain is negative for acid-fast bacilli, and GMS stain is negative  for fungal elements. Controls stained appropriately.    Assessment    Doing well post cholecystectomy.    Plan     Proper lifting techniques reviewed.    Follow up as needed. Resume activities as tolerated. The patient is aware to call back for any questions or new concerns.   HPI, Physical Exam, Assessment and Plan have  been scribed under the direction and in the presence of Robert Bellow, MD. Karie Fetch, RN  I have completed the exam and reviewed the above documentation for accuracy and completeness.  I agree with the above.  Haematologist has been used and any errors in dictation or transcription are unintentional.  Hervey Ard, M.D., F.A.C.S.  Robert Bellow 09/27/2017, 7:40 PM

## 2017-09-27 NOTE — Patient Instructions (Addendum)
The patient is aware to call back for any questions or new concerns. Resume activities as tolerated.   

## 2017-12-31 ENCOUNTER — Encounter: Payer: Self-pay | Admitting: Family Medicine

## 2017-12-31 ENCOUNTER — Ambulatory Visit (INDEPENDENT_AMBULATORY_CARE_PROVIDER_SITE_OTHER): Payer: Medicare Other | Admitting: Family Medicine

## 2017-12-31 VITALS — BP 108/78 | HR 93 | Temp 98.7°F | Resp 17 | Wt 352.6 lb

## 2017-12-31 DIAGNOSIS — J069 Acute upper respiratory infection, unspecified: Secondary | ICD-10-CM | POA: Diagnosis not present

## 2017-12-31 MED ORDER — HYDROCODONE-HOMATROPINE 5-1.5 MG/5ML PO SYRP: ORAL_SOLUTION | ORAL | 0 refills | Status: DC

## 2017-12-31 NOTE — Progress Notes (Signed)
Subjective:     Patient ID: Charles Floyd, male   DOB: 02-04-1951, 67 y.o.   MRN: [id number] Chief Complaint  Patient presents with  . Cough    Patient comes into ofifce today with concerns of cough and congestion for the past 3-4 days. Patient reports the folowing associated symptoms; runny nose, sinus pressure, wheezing and shortness of breath. Patient has tried otc Tylenol Cold and Mucinex.    HPI Reports he raced to get here today and that may have affected his breathing.States he will get short of breath after a bout of coughing. + flu shot  Review of Systems     Objective:   Physical Exam  Constitutional: He appears well-developed and well-nourished. No distress.  Ears: T.M's intact without inflammation Throat: no tonsillar enlargement or exudate Neck: no cervical adenopathy Lungs: clear-repeat oximetry in the 90's     Assessment:    1. Viral upper respiratory tract infectio - HYDROcodone-homatropine (HYCODAN) 5-1.5 MG/5ML syrup; 5 ml 4-6 hours as needed for cough  Dispense: 120 mL; Refill: 0    Plan:    Discussed use of Mucinex D and Delsym.

## 2017-12-31 NOTE — Patient Instructions (Signed)
Discussed use of Mucinex D for congestion and Delsym for cough. 

## 2018-09-26 ENCOUNTER — Telehealth: Payer: Self-pay

## 2018-09-27 ENCOUNTER — Encounter: Payer: Self-pay | Admitting: Family Medicine

## 2018-09-27 ENCOUNTER — Ambulatory Visit (INDEPENDENT_AMBULATORY_CARE_PROVIDER_SITE_OTHER): Payer: Medicare Other | Admitting: Family Medicine

## 2018-09-27 VITALS — BP 110/70 | HR 75 | Temp 98.7°F | Resp 16 | Wt 360.8 lb

## 2018-09-27 DIAGNOSIS — R079 Chest pain, unspecified: Secondary | ICD-10-CM

## 2018-09-27 NOTE — Patient Instructions (Signed)
Try two Aleve twice daily with food.  Monitor for rash appearing in the areas you described. Let me know if symptoms develop or the pain worsens.

## 2018-09-27 NOTE — Progress Notes (Signed)
  Subjective:     Patient ID: Charles Floyd, male   DOB: 05/15/1951, 67 y.o.   MRN: [id number] Chief Complaint  Patient presents with  . Chest Pain    Patient comes in office today with complaints of chest pain located on the left side of the chest radiating to shoulder blade. Patient denies exertion, heavy lifting , increased stress or injury. Patient decribes chest pain as pressure and a pushing feeling, patient reports that his back has been more stiff. Patient denies shortness of breath, URI symptoms, numbness, tingling, visual disurbance or speech.   HPI Describes initial non-exertional pain (6/10) as a pushing/pressure sensation in his left costochondral area. He has also had a pricking sensation in the same dermatome in his left lateral chest. He reports pain is improving spontaneously now 3/10. No heartburn reported. States moved several heavy pots a couple of days prior to onset of his sx. Reports work is stressful.Has had Zostavax in 2013.  Review of Systems     Objective:   Physical Exam  Constitutional: He appears well-developed and well-nourished. No distress.  Cardiovascular: Normal rate and regular rhythm.  Pulmonary/Chest: Breath sounds normal. He exhibits no tenderness.  Skin:  Raised  Non-vesicular erythematous plaque noted on his left back adjacent to his spine. No Zostiform rashes noted.       Assessment:    1. Chest pain, unspecified type - EKG 12-Lead    Plan:    Start aleve and monitor for rash. May call if new symptoms not improving.

## 2018-11-14 NOTE — Telephone Encounter (Signed)
Opened in error. KW °

## 2018-12-16 ENCOUNTER — Telehealth: Payer: Self-pay | Admitting: *Deleted

## 2018-12-16 NOTE — Telephone Encounter (Signed)
Patient would like to be scheduled with Dr. Bary Castilla only Per patient.

## 2018-12-31 ENCOUNTER — Ambulatory Visit (INDEPENDENT_AMBULATORY_CARE_PROVIDER_SITE_OTHER): Payer: Medicare Other | Admitting: General Surgery

## 2018-12-31 ENCOUNTER — Other Ambulatory Visit: Payer: Self-pay

## 2018-12-31 ENCOUNTER — Encounter: Payer: Self-pay | Admitting: General Surgery

## 2018-12-31 ENCOUNTER — Ambulatory Visit: Payer: Medicare Other | Admitting: General Surgery

## 2018-12-31 VITALS — BP 147/88 | HR 76 | Temp 97.9°F | Resp 16 | Ht 71.0 in | Wt 356.2 lb

## 2018-12-31 DIAGNOSIS — Z86010 Personal history of colonic polyps: Secondary | ICD-10-CM | POA: Diagnosis not present

## 2018-12-31 NOTE — Progress Notes (Signed)
Patient ID: Charles Floyd, male   DOB: [birth year], 68 y.o.   MRN: [id number]  Chief Complaint  Patient presents with  . Colonoscopy    5 yr f/u rec colonoscopy, hx polyps, last done 12/17/13    HPI Charles Floyd is a 68 y.o. male.  Who presents for a colonoscopy discussion. The last colonoscopy was completed on 12-17-13. Denies any gastrointestinal issues. Bowels move regular and no bleeding noted. He does admit to increased congestion, light green and "choking" sensation over the past several of months.   HPI  Past Medical History:  Diagnosis Date  . Arthritis    BACK  . Asthma    AS A CHILD-NO INHALERS AS AN ADULT  . Cancer of prostate [facility]) 2012   Robotic Assisted prostatectomy 2012 [facility]  . Gout   . Gout   . History of chicken pox   . History of kidney stones    H/O  . History of measles   . History of mumps   . Panic attacks   . Sleep apnea    using CPAP occasionally     Past Surgical History:  Procedure Laterality Date  . CHOLECYSTECTOMY N/A 09/18/2017   Procedure: LAPAROSCOPIC CHOLECYSTECTOMY WITH INTRAOPERATIVE CHOLANGIOGRAM;  Surgeon: Robert Bellow, MD;  Location: [facility] ORS;  Service: General;  Laterality: N/A;  . COLONOSCOPY  Jan 2003, 12-17-13   Dr Bary Castilla  . kidney stone extraction  07/2013   stent placed  . PROSTATE SURGERY  2012  . PROSTATECTOMY  05/16/2011   [facility]. Abdominal Laparoscopic, robotic assisted    Family History  Problem Relation Age of Onset  . Cancer Mother        colon and breast  . CAD Mother   . Anemia Mother   . Colon cancer Mother   . Congestive Heart Failure Father   . Tuberculosis Father   . CAD Father   . Emphysema Father     Social History Social History   Tobacco Use  . Smoking status: Never Smoker  . Smokeless tobacco: Never Used  Substance Use Topics  . Alcohol use: Yes    Alcohol/week: 0.0 standard drinks    Comment: occasional use  . Drug use: No    No Known Allergies  Current Outpatient  Medications  Medication Sig Dispense Refill  . acetaminophen (TYLENOL) 650 MG CR tablet Take 1,300 mg by mouth every 8 (eight) hours as needed for pain.    Marland Kitchen allopurinol (ZYLOPRIM) 300 MG tablet Take 1 tablet (300 mg total) by mouth daily. (Patient taking differently: Take 300 mg by mouth every morning. ) 30 tablet 6  . bisacodyl (DULCOLAX) 5 MG EC tablet Take 10 mg by mouth daily as needed for mild constipation.     No current facility-administered medications for this visit.     Review of Systems Review of Systems  Constitutional: Negative.   Respiratory: Positive for cough, choking and shortness of breath.   Cardiovascular: Negative.   Gastrointestinal: Negative.   Endocrine: Negative.   Genitourinary: Negative.     Blood pressure (!) 147/88, pulse 76, temperature 97.9 F (36.6 C), temperature source Skin, resp. rate 16, height 5\' 11"  (1.803 m), weight (!) 356 lb 3.2 oz (161.6 kg), SpO2 93 %.  Physical Exam Physical Exam Constitutional:      Appearance: Normal appearance.  HENT:     Head: Normocephalic.     Nose: Nose normal.     Mouth/Throat:     Mouth: Mucous membranes  are dry.  Cardiovascular:     Rate and Rhythm: Normal rate and regular rhythm. Occasional extrasystoles are present.    Comments: No peripheral edema Pulmonary:     Effort: Pulmonary effort is normal. No respiratory distress.     Breath sounds: Normal breath sounds. No stridor. No wheezing or rhonchi.  Neurological:     Mental Status: He is alert.     Data Reviewed Colonoscopy completed in January 2015 showed 2-tubular adenomas in the transverse colon.  Assessment    Candidate for follow-up colonoscopy when respiratory symptoms have been addressed.    Plan    The patient will go up to his primary care office today to arrange for an office visit in the near future for assessment.  He will contact the office when he is resolved his pulmonary symptoms.  He was instructed in regards to colonoscopy  prep. Colonoscopy with possible biopsy/polypectomy prn: Information regarding the procedure, including its potential risks and complications (including but not limited to perforation of the bowel, which may require emergency surgery to repair, and bleeding) was verbally given to the patient. Educational information regarding lower intestinal endoscopy was given to the patient. Written instructions for how to complete the bowel prep using Miralax were provided. The importance of drinking ample fluids to avoid dehydration as a result of the prep emphasized.       HPI, Physical Exam, Assessment and Plan have been scribed under the direction and in the presence of Robert Bellow, MD. Karie Fetch, RN I have completed the exam and reviewed the above documentation for accuracy and completeness.  I agree with the above.  Haematologist has been used and any errors in dictation or transcription are unintentional.  Hervey Ard, M.D., F.A.C.S. Forest Gleason Shaketha Jeon 12/31/2018, 9:40 AM

## 2018-12-31 NOTE — Patient Instructions (Addendum)
Recommend seeing PCP, Mariel Sleet PA regarding persist symptoms.  The patient is aware to call back for any questions or new concerns.  Colonoscopy, Adult A colonoscopy is an exam to look at the entire large intestine. During the exam, a lubricated, flexible tube that has a camera on the end of it is inserted into the anus and then passed into the rectum, colon, and other parts of the large intestine. You may have a colonoscopy as a part of normal colorectal screening or if you have certain symptoms, such as:  Lack of red blood cells (anemia).  Diarrhea that does not go away.  Abdominal pain.  Blood in your stool (feces). A colonoscopy can help screen for and diagnose medical problems, including:  Tumors.  Polyps.  Inflammation.  Areas of bleeding. Tell a health care provider about:  Any allergies you have.  All medicines you are taking, including vitamins, herbs, eye drops, creams, and over-the-counter medicines.  Any problems you or family members have had with anesthetic medicines.  Any blood disorders you have.  Any surgeries you have had.  Any medical conditions you have.  Any problems you have had passing stool. What are the risks? Generally, this is a safe procedure. However, problems may occur, including:  Bleeding.  A tear in the intestine.  A reaction to medicines given during the exam.  Infection (rare). What happens before the procedure? Eating and drinking restrictions Follow instructions from your health care provider about eating and drinking, which may include:  A few days before the procedure - follow a low-fiber diet. Avoid nuts, seeds, dried fruit, raw fruits, and vegetables.  1-3 days before the procedure - follow a clear liquid diet. Drink only clear liquids, such as clear broth or bouillon, black coffee or tea, clear juice, clear soft drinks or sports drinks, gelatin dessert, and popsicles. Avoid any liquids that contain red or purple  dye.  On the day of the procedure - do not eat or drink anything starting 2 hours before the procedure, or within the time period that your health care provider recommends. Up to 2 hours before the procedure, you may continue to drink clear liquids, such as water or clear fruit juice. Bowel prep If you were prescribed an oral bowel prep to clean out your colon:  Take it as told by your health care provider. Starting the day before your procedure, you will need to drink a large amount of medicated liquid. The liquid will cause you to have multiple loose stools until your stool is almost clear or light green.  If your skin or anus gets irritated from diarrhea, you may use these to relieve the irritation: ? Medicated wipes, such as adult wet wipes with aloe and vitamin E. ? A skin-soothing product like petroleum jelly.  If you vomit while drinking the bowel prep, take a break for up to 60 minutes and then begin the bowel prep again. If vomiting continues and you cannot take the bowel prep without vomiting, call your health care provider.  To clean out your colon, you may also be given: ? Laxative medicines. ? Instructions about how to use an enema. General instructions  Ask your health care provider about: ? Changing or stopping your regular medicines or supplements. This is especially important if you are taking iron supplements, diabetes medicines, or blood thinners. ? Taking medicines such as aspirin and ibuprofen. These medicines can thin your blood. Do not take these medicines before the procedure if your health  care provider tells you not to.  Plan to have someone take you home from the hospital or clinic. What happens during the procedure?   An IV may be inserted into one of your veins.  You will be given medicine to help you relax (sedative).  To reduce your risk of infection: ? Your health care team will wash or sanitize their hands. ? Your anal area will be washed with  soap.  You will be asked to lie on your side with your knees bent.  Your health care provider will lubricate a long, thin, flexible tube. The tube will have a camera and a light on the end.  The tube will be inserted into your anus.  The tube will be gently eased through your rectum and colon.  Air will be delivered into your colon to keep it open. You may feel some pressure or cramping.  The camera will be used to take images during the procedure.  A small tissue sample may be removed to be examined under a microscope (biopsy).  If small polyps are found, your health care provider may remove them and have them checked for cancer cells.  When the exam is done, the tube will be removed. The procedure may vary among health care providers and hospitals. What happens after the procedure?  Your blood pressure, heart rate, breathing rate, and blood oxygen level will be monitored until the medicines you were given have worn off.  Do not drive for 24 hours after the exam.  You may have a small amount of blood in your stool.  You may pass gas and have mild abdominal cramping or bloating due to the air that was used to inflate your colon during the exam.  It is up to you to get the results of your procedure. Ask your health care provider, or the department performing the procedure, when your results will be ready. Summary  A colonoscopy is an exam to look at the entire large intestine.  During a colonoscopy, a lubricated, flexible tube with a camera on the end of it is inserted into the anus and then passed into the colon and other parts of the large intestine.  Follow instructions from your health care provider about eating and drinking before the procedure.  If you were prescribed an oral bowel prep to clean out your colon, take it as told by your health care provider.  After your procedure, your blood pressure, heart rate, breathing rate, and blood oxygen level will be monitored  until the medicines you were given have worn off. This information is not intended to replace advice given to you by your health care provider. Make sure you discuss any questions you have with your health care provider. Document Released: 11/17/2000 Document Revised: 09/12/2017 Document Reviewed: 02/01/2016 Elsevier Interactive Patient Education  2019 [employer].

## 2019-01-06 ENCOUNTER — Ambulatory Visit (INDEPENDENT_AMBULATORY_CARE_PROVIDER_SITE_OTHER): Payer: Medicare Other | Admitting: Family Medicine

## 2019-01-06 ENCOUNTER — Encounter: Payer: Self-pay | Admitting: Family Medicine

## 2019-01-06 ENCOUNTER — Other Ambulatory Visit: Payer: Self-pay

## 2019-01-06 VITALS — BP 112/68 | HR 83 | Temp 98.3°F | Ht 71.0 in | Wt 357.0 lb

## 2019-01-06 DIAGNOSIS — J329 Chronic sinusitis, unspecified: Secondary | ICD-10-CM

## 2019-01-06 MED ORDER — AMOXICILLIN-POT CLAVULANATE 875-125 MG PO TABS: 1.0000 | ORAL_TABLET | Freq: Two times a day (BID) | ORAL | 0 refills | Status: DC

## 2019-01-06 MED ORDER — HYDROCODONE-HOMATROPINE 5-1.5 MG/5ML PO SYRP: ORAL_SOLUTION | ORAL | 0 refills | Status: DC

## 2019-01-06 MED ORDER — PREDNISONE 20 MG PO TABS: ORAL_TABLET | ORAL | 0 refills | Status: DC

## 2019-01-06 NOTE — Progress Notes (Signed)
  Subjective:     Patient ID: Charles Floyd, male   DOB: 11-06-1951, 68 y.o.   MRN: [id number] Chief Complaint  Patient presents with  . Sinusitis    a lot of congetion and feels he is drowning when laying down, green mucus, cough, not really a fever.  since 12/31/18   HPI States he developed PND > one week ago.  This has persisted and he has increased cough with occasional production of greenish sputum. States his son is sick as well.  Review of Systems     Objective:   Physical Exam Constitutional:      General: He is not in acute distress.    Appearance: He is ill-appearing.  Neurological:     Mental Status: He is alert.   Ears: T.M's intact without inflammation Sinuses: non-tender Throat: mild tonsillar enlargement without erythema or exudate Neck: no cervical adenopathy Lungs: Transient wheeze o/w clear     Assessment:    1. Sinusitis, unspecified chronicity, unspecified location: predisone 20 mg. Bid x 5 days. - amoxicillin-clavulanate (AUGMENTIN) 875-125 MG tablet; Take 1 tablet by mouth 2 (two) times daily.  Dispense: 20 tablet; Refill: 0 - HYDROcodone-homatropine (HYCODAN) 5-1.5 MG/5ML syrup; 5 ml 4-6 hours as needed for cough  Dispense: 100 mL; Refill: 0    Plan:    Continue Mucinex. Call if not improving.

## 2019-01-06 NOTE — Patient Instructions (Addendum)
Continue Mucinex. Let me know if not improving.

## 2019-08-05 DIAGNOSIS — I4891 Unspecified atrial fibrillation: Secondary | ICD-10-CM

## 2019-08-05 HISTORY — DX: Unspecified atrial fibrillation: I48.91

## 2019-08-24 ENCOUNTER — Encounter: Payer: Self-pay | Admitting: Family Medicine

## 2019-08-25 ENCOUNTER — Encounter: Payer: Self-pay | Admitting: Family Medicine

## 2019-08-25 ENCOUNTER — Telehealth: Payer: Self-pay | Admitting: Family Medicine

## 2019-08-25 NOTE — Telephone Encounter (Signed)
This is a patient of Bob's with URI symptoms and possible a-fib per [employer]. If he thinks he has a-fib he needs to go to ER. He can't come here with respiratory symptoms.   Charles Babel "Stan"   23 hours ago (2:04 PM)     [name], I am having same symptoms as Feb 3rd visit.  Coughing up phlegm, wheezing, short breath. No fever.  Have had this since the week after Labor Day.  I have been taking over the counter meds and cough. My Apple Watch is warning possible a fib (from meds/ panick attack?). Advising talking to doctor. Do we need to set an e-visit, phone call or what is the next step with the current procedures of the office?   Thanks! [name]

## 2019-08-25 NOTE — Telephone Encounter (Addendum)
Spoke to patient regarding his symptoms. He states that he mixed some medications up this weekend that caused him to have an irregular heartbeat, which is back to normal now. He wanted to request a visit for his other symptoms. Appointment scheduled. It will be a telephone visit due to current symptoms.

## 2019-08-25 NOTE — Telephone Encounter (Signed)
See telephone message

## 2019-08-25 NOTE — Progress Notes (Signed)
Patient: Charles Floyd Male    DOB: June 11, 1951   68 y.o.   MRN: [id number] Visit Date: 08/25/2019  Today's Provider: Vernie Murders, PA   Complaint: Cough and congestion Virtual Visit via Telephone Note  I connected with Charles Floyd on 08/26/19 at  9:20 AM EDT by telephone and verified that I am speaking with the correct person using two identifiers.  Location: Patient: Work (self-employed) Provider: Office   I discussed the limitations, risks, security and privacy concerns of performing an evaluation and management service by telephone and the availability of in person appointments. I also discussed with the patient that there may be a patient responsible charge related to this service. The patient expressed understanding and agreed to proceed.  Subjective:     HPI  Patient states that he has been experiencing shortness of breath, cough with congestion, fatigue, and lots of phlegm for the past 2 weeks.   Past Medical History:  Diagnosis Date   Arthritis    BACK   Asthma    AS A CHILD-NO INHALERS AS AN ADULT   Cancer of prostate [facility]) 2012   Robotic Assisted prostatectomy 2012 [facility]   Gout    Gout    History of chicken pox    History of kidney stones    H/O   History of measles    History of mumps    Panic attacks    Sleep apnea    using CPAP occasionally    Past Surgical History:  Procedure Laterality Date   CHOLECYSTECTOMY N/A 09/18/2017   Procedure: LAPAROSCOPIC CHOLECYSTECTOMY WITH INTRAOPERATIVE CHOLANGIOGRAM;  Surgeon: Robert Bellow, MD;  Location: [facility] ORS;  Service: General;  Laterality: N/A;   COLONOSCOPY  Jan 2003, 12-17-13   Dr Bary Castilla   kidney stone extraction  07/2013   stent placed   PROSTATE SURGERY  2012   PROSTATECTOMY  05/16/2011   [facility] [place]. Abdominal Laparoscopic, robotic assisted   Family History  Problem Relation Age of Onset   Cancer Mother        colon and breast   CAD Mother    Anemia  Mother    Colon cancer Mother    Congestive Heart Failure Father    Tuberculosis Father    CAD Father    Emphysema Father     No Known Allergies  Current Outpatient Medications:    acetaminophen (TYLENOL) 650 MG CR tablet, Take 1,300 mg by mouth every 8 (eight) hours as needed for pain., Disp: , Rfl:    bisacodyl (DULCOLAX) 5 MG EC tablet, Take 10 mg by mouth daily as needed for mild constipation., Disp: , Rfl:    allopurinol (ZYLOPRIM) 300 MG tablet, Take 1 tablet (300 mg total) by mouth daily. (Patient not taking: Reported on 08/25/2019), Disp: 30 tablet, Rfl: 6   amoxicillin-clavulanate (AUGMENTIN) 875-125 MG tablet, Take 1 tablet by mouth 2 (two) times daily. (Patient not taking: Reported on 08/25/2019), Disp: 20 tablet, Rfl: 0   HYDROcodone-homatropine (HYCODAN) 5-1.5 MG/5ML syrup, 5 ml 4-6 hours as needed for cough (Patient not taking: Reported on 08/25/2019), Disp: 100 mL, Rfl: 0   predniSONE (DELTASONE) 20 MG tablet, One pill twice daily for 5 days (Patient not taking: Reported on 08/25/2019), Disp: 10 tablet, Rfl: 0  Review of Systems  Constitutional: Positive for fatigue.  HENT: Positive for congestion.   Respiratory: Positive for cough and shortness of breath.    Social History   Tobacco Use   Smoking  status: Never Smoker   Smokeless tobacco: Never Used  Substance Use Topics   Alcohol use: Yes    Alcohol/week: 0.0 standard drinks    Comment: occasional use      Objective:   Temp 98 F (36.7 C) (Oral)   Physical Exam: Some ticklish cough. No apparent wheeze or significant respiratory distress during telephonic interview.     Assessment & Plan    1. URI with cough and congestion Developed cough with creamy sputum, rhinorrhea, sinus headache and malaise with fatigue over the past 2 weeks. Denies fever or significant shortness of breath. No known exposure to anyone positive for COVID-19. Will treat with prednisone and Augmentin. May add Mucinex-DM and  antipyretic. Will proceed with COVID test. Should go home to rest and isolate until result is available. - predniSONE (DELTASONE) 20 MG tablet; One pill twice daily for 5 days  Dispense: 10 tablet; Refill: 0 - amoxicillin-clavulanate (AUGMENTIN) 875-125 MG tablet; Take 1 tablet by mouth 2 (two) times daily.  Dispense: 20 tablet; Refill: 0  2. Malaise and fatigue Worse energy level today and general malaise over the past 1-2 days. No fever or chills today. Will get COVID test and treat bronchitis. May use antipyretic prn and isolate at home.  3. Suspected Covid-19 Virus Infection Cough with rhinorrhea, creamy sputum and headache with fatigue over the past 2 weeks. Headache around eyes this morning similar to past sinusitis. Denies fever and no wheeze. Recommend COVID test and treat bronchitis with sinusitis/PND. Home to isolate until test results available. May use Tylenol, Mucinex-DM and increase fluid intake. Follow pandemic restrictions and isolation recommendations. - Novel Coronavirus, NAA ([facility]) - MyChart COVID-19 home monitoring program - Temperature monitoring; Future  Follow Up Instructions:  I discussed the assessment and treatment plan with the patient. The patient was provided an opportunity to ask questions and all were answered. The patient agreed with the plan and demonstrated an understanding of the instructions.   The patient was advised to call back or seek an in-person evaluation if the symptoms worsen or if the condition fails to improve as anticipated.  I provided 16 minutes of non-face-to-face time during this encounter.     Vernie Murders, PA  [facility]

## 2019-08-25 NOTE — Telephone Encounter (Signed)
Pt called this morning wanting to make sure we seen his mychart message.    Con Memos

## 2019-08-26 ENCOUNTER — Other Ambulatory Visit: Payer: Self-pay

## 2019-08-26 ENCOUNTER — Encounter: Payer: Self-pay | Admitting: Family Medicine

## 2019-08-26 ENCOUNTER — Ambulatory Visit (INDEPENDENT_AMBULATORY_CARE_PROVIDER_SITE_OTHER): Payer: Medicare Other | Admitting: Family Medicine

## 2019-08-26 VITALS — Temp 98.0°F

## 2019-08-26 DIAGNOSIS — R6889 Other general symptoms and signs: Secondary | ICD-10-CM

## 2019-08-26 DIAGNOSIS — R5381 Other malaise: Secondary | ICD-10-CM | POA: Diagnosis not present

## 2019-08-26 DIAGNOSIS — R5383 Other fatigue: Secondary | ICD-10-CM

## 2019-08-26 DIAGNOSIS — J069 Acute upper respiratory infection, unspecified: Secondary | ICD-10-CM | POA: Diagnosis not present

## 2019-08-26 DIAGNOSIS — Z20822 Contact with and (suspected) exposure to covid-19: Secondary | ICD-10-CM

## 2019-08-26 DIAGNOSIS — J329 Chronic sinusitis, unspecified: Secondary | ICD-10-CM | POA: Insufficient documentation

## 2019-08-26 MED ORDER — AMOXICILLIN-POT CLAVULANATE 875-125 MG PO TABS: 1.0000 | ORAL_TABLET | Freq: Two times a day (BID) | ORAL | 0 refills | Status: DC

## 2019-08-26 MED ORDER — PREDNISONE 20 MG PO TABS: ORAL_TABLET | ORAL | 0 refills | Status: DC

## 2019-09-02 ENCOUNTER — Encounter: Payer: Self-pay | Admitting: Emergency Medicine

## 2019-09-02 ENCOUNTER — Other Ambulatory Visit: Payer: Self-pay

## 2019-09-02 ENCOUNTER — Telehealth: Payer: Self-pay | Admitting: *Deleted

## 2019-09-02 ENCOUNTER — Emergency Department
Admission: EM | Admit: 2019-09-02 | Discharge: 2019-09-02 | Disposition: A | Discharge status: Home / Self Care | Payer: Medicare Other | Attending: Emergency Medicine | Admitting: Emergency Medicine

## 2019-09-02 DIAGNOSIS — J45909 Unspecified asthma, uncomplicated: Secondary | ICD-10-CM | POA: Insufficient documentation

## 2019-09-02 DIAGNOSIS — Z8546 Personal history of malignant neoplasm of prostate: Secondary | ICD-10-CM | POA: Insufficient documentation

## 2019-09-02 DIAGNOSIS — R531 Weakness: Secondary | ICD-10-CM | POA: Diagnosis not present

## 2019-09-02 DIAGNOSIS — I4891 Unspecified atrial fibrillation: Secondary | ICD-10-CM | POA: Diagnosis not present

## 2019-09-02 DIAGNOSIS — R0602 Shortness of breath: Secondary | ICD-10-CM | POA: Diagnosis not present

## 2019-09-02 LAB — CBC
HCT: 48.6 % (ref 39.0–52.0)
Hemoglobin: 15.4 g/dL (ref 13.0–17.0)
MCH: 29.7 pg (ref 26.0–34.0)
MCHC: 31.7 g/dL (ref 30.0–36.0)
MCV: 93.8 fL (ref 80.0–100.0)
Platelets: 246 10*3/uL (ref 150–400)
RBC: 5.18 MIL/uL (ref 4.22–5.81)
RDW: 14.6 % (ref 11.5–15.5)
WBC: 13.2 10*3/uL — ABNORMAL HIGH (ref 4.0–10.5)
nRBC: 0 % (ref 0.0–0.2)

## 2019-09-02 LAB — BASIC METABOLIC PANEL
Anion gap: 10 (ref 5–15)
BUN: 18 mg/dL (ref 8–23)
CO2: 30 mmol/L (ref 22–32)
Calcium: 9 mg/dL (ref 8.9–10.3)
Chloride: 100 mmol/L (ref 98–111)
Creatinine, Ser: 1 mg/dL (ref 0.61–1.24)
GFR calc Af Amer: >60 mL/min (ref >60)
GFR calc non Af Amer: >60 mL/min (ref >60)
Glucose, Bld: 104 mg/dL — ABNORMAL HIGH (ref 70–99)
Potassium: 4.1 mmol/L (ref 3.5–5.1)
Sodium: 140 mmol/L (ref 135–145)

## 2019-09-02 MED ORDER — APIXABAN 5 MG PO TABS: 5.0000 mg | ORAL_TABLET | Freq: Two times a day (BID) | ORAL | 1 refills | Status: DC

## 2019-09-02 MED ORDER — SODIUM CHLORIDE 0.9% FLUSH: 3.0000 mL | Freq: Once | INTRAVENOUS | Status: DC

## 2019-09-02 MED ORDER — METOPROLOL TARTRATE 25 MG PO TABS: 25.0000 mg | ORAL_TABLET | Freq: Two times a day (BID) | ORAL | 1 refills | Status: DC

## 2019-09-02 NOTE — Telephone Encounter (Signed)
Patient states he used the Augmentin and Prednisone for bronchitis on 08-26-19. Felt so much better, cough with sputum production improved by the next day, he decided not to get the COVID-19 test. No fever or cough now and has finished the Prednisone. Has noticed heart palpitations and shortness of breath to walk across the room the past few days. His Apple Watch keeps alerting him that he is in atrial fibrillation. Advised he should go to the ER this evening to rule out impending MI and be sure no pneumonia. Patient agrees and will go there directly now.

## 2019-09-02 NOTE — Telephone Encounter (Signed)
Charles Floyd, I can't tell that this patient ever had his Covid testing done

## 2019-09-02 NOTE — ED Provider Notes (Signed)
[facility] Emergency Department Provider Note  ____________________________________________   I have reviewed the triage vital signs and the nursing notes.   HISTORY  Chief Complaint Tachycardia   History limited by: Not Limited   HPI Charles Floyd is a 68 y.o. male who presents to the emergency department today because of concerns for atrial fibrillation.  The patient states he was alerted to this from his apple iwatch. It has been happening over the past week. He has felt some shortness of breath and weakness. The patient denies any history of afib in the past. Denies any new medications. The patient denies any fevers.    Records reviewed. Per medical record review patient has a history of sleep apnea  Past Medical History:  Diagnosis Date  . Arthritis    BACK  . Asthma    AS A CHILD-NO INHALERS AS AN ADULT  . Cancer of prostate [facility]) 2012   Robotic Assisted prostatectomy 2012 [facility]  . Gout   . Gout   . History of chicken pox   . History of kidney stones    H/O  . History of measles   . History of mumps   . Panic attacks   . Sleep apnea    using CPAP occasionally     Patient Active Problem List   Diagnosis Date Noted  . Sinusitis 08/26/2019  . History of colonic polyps 12/31/2018  . Morbid obesity with BMI of 45.0-49.9, adult ([place]) 07/18/2017  . Trigger middle finger of left hand 07/18/2017  . Trigger middle finger of right hand 07/18/2017  . Obstructive sleep apnea 06/02/2016  . LBP (low back pain) 03/23/2016  . History of kidney stones 03/15/2016  . Prostate cancer ([place]) 03/15/2016  . Vitamin D deficiency 03/15/2016  . Gout 03/15/2016    Past Surgical History:  Procedure Laterality Date  . CHOLECYSTECTOMY N/A 09/18/2017   Procedure: LAPAROSCOPIC CHOLECYSTECTOMY WITH INTRAOPERATIVE CHOLANGIOGRAM;  Surgeon: Robert Bellow, MD;  Location: [facility] ORS;  Service: General;  Laterality: N/A;  . COLONOSCOPY  Jan 2003, 12-17-13   Dr Bary Castilla  . kidney stone extraction  07/2013   stent placed  . PROSTATE SURGERY  2012  . PROSTATECTOMY  05/16/2011   [facility]. Abdominal Laparoscopic, robotic assisted    Prior to Admission medications   Medication Sig Start Date End Date Taking? Authorizing Provider  acetaminophen (TYLENOL) 650 MG CR tablet Take 1,300 mg by mouth every 8 (eight) hours as needed for pain.    [provider]  amoxicillin-clavulanate (AUGMENTIN) 875-125 MG tablet Take 1 tablet by mouth 2 (two) times daily. 08/26/19   Chrismon, Vickki Muff, PA  bisacodyl (DULCOLAX) 5 MG EC tablet Take 10 mg by mouth daily as needed for mild constipation.    [provider]  predniSONE (DELTASONE) 20 MG tablet One pill twice daily for 5 days 08/26/19   Chrismon, Vickki Muff, PA    Allergies Patient has no known allergies.  Family History  Problem Relation Age of Onset  . Cancer Mother        colon and breast  . CAD Mother   . Anemia Mother   . Colon cancer Mother   . Congestive Heart Failure Father   . Tuberculosis Father   . CAD Father   . Emphysema Father     Social History Social History   Tobacco Use  . Smoking status: Never Smoker  . Smokeless tobacco: Never Used  Substance Use Topics  . Alcohol use: Yes  Alcohol/week: 0.0 standard drinks    Comment: occasional use  . Drug use: No    Review of Systems Constitutional: No fever/chills Eyes: No visual changes. ENT: No sore throat. Cardiovascular: Positive for palpitations Respiratory: Positive for shortness of breath. Gastrointestinal: No abdominal pain.  No nausea, no vomiting.  No diarrhea.   Genitourinary: Negative for dysuria. Musculoskeletal: Negative for back pain. Skin: Negative for rash. Neurological: Negative for headaches, focal weakness or numbness.  ____________________________________________   PHYSICAL EXAM:  VITAL SIGNS: ED Triage Vitals  Enc Vitals Group     BP 09/02/19 1733 124/66     Pulse Rate 09/02/19  1733 96     Resp 09/02/19 1733 18     Temp --      Temp src --      SpO2 09/02/19 1733 95 %     Weight 09/02/19 1734 (!) 345 lb (156.5 kg)     Height 09/02/19 1734 5\' 11"  (1.803 m)     Head Circumference --      Peak Flow --      Pain Score 09/02/19 1734 0   Constitutional: Alert and oriented.  Eyes: Conjunctivae are normal.  ENT      Head: Normocephalic and atraumatic.      Nose: No congestion/rhinnorhea.      Mouth/Throat: Mucous membranes are moist.      Neck: No stridor. Hematological/Lymphatic/Immunilogical: No cervical lymphadenopathy. Cardiovascular: Normal rate, irregularly irregular rhythm.  No murmurs, rubs, or gallops.  Respiratory: Normal respiratory effort without tachypnea nor retractions. Breath sounds are clear and equal bilaterally. No wheezes/rales/rhonchi. Gastrointestinal: Soft and non tender. No rebound. No guarding.  Genitourinary: Deferred Musculoskeletal: Normal range of motion in all extremities. No lower extremity edema. Neurologic:  Normal speech and language. No gross focal neurologic deficits are appreciated.  Skin:  Skin is warm, dry and intact. No rash noted. Psychiatric: Mood and affect are normal. Speech and behavior are normal. Patient exhibits appropriate insight and judgment.  ____________________________________________    LABS (pertinent positives/negatives)  CBC wbc 13.2, hgb 15.4, plt 246 BMP wnl except glu 104  ____________________________________________   EKG  I, Nance Pear, attending physician, personally viewed and interpreted this EKG  EKG Time: 1726 Rate: 89 Rhythm: atrial fibrillation Axis: normal Intervals: qtc 418 QRS: narrow ST changes: no st elevation Impression: abnormal ekg  ____________________________________________    RADIOLOGY  None  ____________________________________________   PROCEDURES  Procedures  ____________________________________________   INITIAL IMPRESSION / ASSESSMENT AND  PLAN / ED COURSE  Pertinent labs & imaging results that were available during my care of the patient were reviewed by me and considered in my medical decision making (see chart for details).   Patient presented to the emergency department today because of concerns for atrial fibrillation which was discovered by his apple iWatch.  EKG here does confirm that.  Patient's blood work without any concerning electrolyte abnormalities.  Discussed with Dr. Lovena Le with cardiology. Will start patient on blood thinning medication and low-dose metoprolol. Will have patient follow up with cardiology.  ____________________________________________   FINAL CLINICAL IMPRESSION(S) / ED DIAGNOSES  Final diagnoses:  Atrial fibrillation, unspecified type [facility])     Note: This dictation was prepared with Dragon dictation. Any transcriptional errors that result from this process are unintentional     Nance Pear, MD 09/02/19 2142

## 2019-09-02 NOTE — ED Notes (Signed)
Pt ambulatory to room 2 without difficulty or distress noted; assisted into hosp gown & on card monitor; pt reports last several days his apple watch had alerted him of an atrial fib rhythm; st he has felt his heart racing with some SHOB; denies pain or other accomp symptoms; denies hx of same; st has been on amoxi and prednisone recently (9/22) for prod cough clear sputum but no fever; prednisone is completed and antibiotic continues; resp even/unlab, lungs clear, apical audible & irreg; +BS, abd soft/nondist/nontender

## 2019-09-02 NOTE — Telephone Encounter (Signed)
Patient called with concerns of still being in a fib. Patient states he addressed symptoms with Vernie Murders 08/26/2019 during virtual visit.  Patient states he is not better. Patient wants to know if he needs to be seen or if he needs a referral to cardiology? Please advise?

## 2019-09-02 NOTE — ED Notes (Signed)
Pt pulled from lobby line for stat ekg.  New onset of afib.  Pt in triage room now.

## 2019-09-02 NOTE — Discharge Instructions (Addendum)
Please seek medical attention for any high fevers, chest pain, shortness of breath, change in behavior, persistent vomiting, bloody stool or any other new or concerning symptoms.  

## 2019-09-02 NOTE — ED Triage Notes (Signed)
Pt to ER states he has noted rapid HR on and off for the last three days.  Pt states his Apple Watch has been alarming Afib for the last three days.  Pt does not have a hx of same.  EKG notes Afib at this time with a controlled rate.  Pt denies pain at this time.  States he has not felt well, taking prednisone and abx for a cough.

## 2019-09-08 ENCOUNTER — Encounter: Payer: Self-pay | Admitting: Internal Medicine

## 2019-09-08 ENCOUNTER — Ambulatory Visit: Payer: Medicare Other | Admitting: Internal Medicine

## 2019-09-08 NOTE — Progress Notes (Deleted)
New Outpatient Visit Date: 09/08/2019  Referring Provider: Nance Pear, MD [facility] Emergency Department  Chief Complaint: ***  HPI:  Charles Floyd is a 68 y.o. male who is being seen today for the evaluation of atrial fibrillation at the request of Dr. Archie Balboa. He has a history of obstructive sleep apnea, morbid obesity, gout, and prostate cancer status post prostatectomy.  He presented to the [facility] emergency department in late September after he was alerted by his Apple Watch that he may be having atrial fibrillation.  He noted some generalized weakness and shortness of breath but was otherwise in his usual state of health.  EKG confirmed atrial fibrillation.  Charles Floyd was discharged on metoprolol and apixaban.  --------------------------------------------------------------------------------------------------  Cardiovascular History & Procedures: Cardiovascular Problems:  Atrial fibrillation  Risk Factors:  Morbid obesity, male gender, and age greater than 41  Cath/PCI:  ***  CV Surgery:  None  EP Procedures and Devices:  None  Non-Invasive Evaluation(s):  None  Recent CV Pertinent Labs: Lab Results  Component Value Date   CHOL 149 03/24/2016   HDL 38 (L) 03/24/2016   LDLCALC 100 (H) 03/24/2016   TRIG 56 03/24/2016   CHOLHDL 3.9 03/24/2016   K 4.1 09/02/2019   BUN 18 09/02/2019   BUN 16 03/24/2016   CREATININE 1.00 09/02/2019   CREATININE 1.16 08/23/2017    --------------------------------------------------------------------------------------------------  Past Medical History:  Diagnosis Date  . Arthritis    BACK  . Asthma    AS A CHILD-NO INHALERS AS AN ADULT  . Cancer of prostate [facility]) 2012   Robotic Assisted prostatectomy 2012 [facility]  . Gout   . Gout   . History of chicken pox   . History of kidney stones    H/O  . History of measles   . History of mumps   . Panic attacks   . Sleep apnea    using CPAP occasionally     Past Surgical  History:  Procedure Laterality Date  . CHOLECYSTECTOMY N/A 09/18/2017   Procedure: LAPAROSCOPIC CHOLECYSTECTOMY WITH INTRAOPERATIVE CHOLANGIOGRAM;  Surgeon: Robert Bellow, MD;  Location: [facility] ORS;  Service: General;  Laterality: N/A;  . COLONOSCOPY  Jan 2003, 12-17-13   Dr Bary Castilla  . kidney stone extraction  07/2013   stent placed  . PROSTATE SURGERY  2012  . PROSTATECTOMY  05/16/2011   [facility]. Abdominal Laparoscopic, robotic assisted    No outpatient medications have been marked as taking for the 09/08/19 encounter (Appointment) with Addie Cederberg, Harrell Gave, MD.    Allergies: Patient has no known allergies.  Social History   Tobacco Use  . Smoking status: Never Smoker  . Smokeless tobacco: Never Used  Substance Use Topics  . Alcohol use: Yes    Alcohol/week: 0.0 standard drinks    Comment: occasional use  . Drug use: No    Family History  Problem Relation Age of Onset  . Cancer Mother        colon and breast  . CAD Mother   . Anemia Mother   . Colon cancer Mother   . Congestive Heart Failure Father   . Tuberculosis Father   . CAD Father   . Emphysema Father     Review of Systems: A 12-system review of systems was performed and was negative except as noted in the HPI.  --------------------------------------------------------------------------------------------------  Physical Exam: There were no vitals taken for this visit.  General:  *** HEENT: No conjunctival pallor or scleral icterus. Moist mucous membranes. OP clear. Neck:  Supple without lymphadenopathy, thyromegaly, JVD, or HJR. No carotid bruit. Lungs: Normal work of breathing. Clear to auscultation bilaterally without wheezes or crackles. Heart: Regular rate and rhythm without murmurs, rubs, or gallops. Non-displaced PMI. Abd: Bowel sounds present. Soft, NT/ND without hepatosplenomegaly Ext: No lower extremity edema. Radial, PT, and DP pulses are 2+ bilaterally Skin: Warm and dry without rash. Neuro:  CNIII-XII intact. Strength and fine-touch sensation intact in upper and lower extremities bilaterally. Psych: Normal mood and affect.  EKG:  ***  Lab Results  Component Value Date   WBC 13.2 (H) 09/02/2019   HGB 15.4 09/02/2019   HCT 48.6 09/02/2019   MCV 93.8 09/02/2019   PLT 246 09/02/2019    Lab Results  Component Value Date   NA 140 09/02/2019   K 4.1 09/02/2019   CL 100 09/02/2019   CO2 30 09/02/2019   BUN 18 09/02/2019   CREATININE 1.00 09/02/2019   GLUCOSE 104 (H) 09/02/2019   ALT 25 09/19/2017    Lab Results  Component Value Date   CHOL 149 03/24/2016   HDL 38 (L) 03/24/2016   LDLCALC 100 (H) 03/24/2016   TRIG 56 03/24/2016   CHOLHDL 3.9 03/24/2016     --------------------------------------------------------------------------------------------------  ASSESSMENT AND PLAN: Harrell Gave Amarya Kuehl, MD 09/08/2019 7:36 AM

## 2019-09-09 ENCOUNTER — Other Ambulatory Visit: Payer: Self-pay

## 2019-09-09 ENCOUNTER — Encounter: Payer: Self-pay | Admitting: Cardiology

## 2019-09-09 ENCOUNTER — Ambulatory Visit (INDEPENDENT_AMBULATORY_CARE_PROVIDER_SITE_OTHER): Payer: Medicare Other | Admitting: Cardiology

## 2019-09-09 VITALS — BP 110/70 | HR 87 | Temp 98.1°F | Ht 72.0 in | Wt 357.8 lb

## 2019-09-09 DIAGNOSIS — G4730 Sleep apnea, unspecified: Secondary | ICD-10-CM

## 2019-09-09 DIAGNOSIS — Z6841 Body Mass Index (BMI) 40.0 and over, adult: Secondary | ICD-10-CM | POA: Diagnosis not present

## 2019-09-09 DIAGNOSIS — I4891 Unspecified atrial fibrillation: Secondary | ICD-10-CM | POA: Diagnosis not present

## 2019-09-09 NOTE — Progress Notes (Signed)
Cardiology Office Note:    Date:  09/09/2019   ID:  Charles Floyd, DOB 1951/06/30, MRN [id number]  PCP:  Birdie Sons, MD  Cardiologist:  Kate Sable, MD  Electrophysiologist:  None   Referring MD: Birdie Sons, MD   Chief Complaint  Patient presents with  . New Patient (Initial Visit)    ED F/U-Atrial Fibrillation    History of Present Illness:    Charles Floyd is a 68 y.o. male with a hx of OSA on CPAP, atrial fibrillation, obesity who presents as a follow-up from the ED due to atrial fibrillation.  About 2 weeks ago, patient was having symptoms of congestion and cough.  He was on antibiotics and steroids.  His apple watch told him his heart rhythm was in atrial fibrillation.  He thought may be taking his cold medications may help.  But his watch kept alerting him of an irregular heart rhythm.  He then called his PCP who advised him to go to the emergency room.  In the emergency room patient was noted to be in atrial fibrillation with controlled ventricular response.  He was placed on Lopressor 25 mg twice daily and Eliquis 5 mg twice daily.  Patient states he has not taking the Eliquis due to cost.  His drug insurance is supposed to be active later on this month.  He endorses palpitations but denies chest pain.  He states he has not used his CPAP for a while now.  He endorses daytime somnolence and fatigue.  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  Has been feeling stressed of late.  Past Medical History:  Diagnosis Date  . Arthritis    BACK  . Asthma    AS A CHILD-NO INHALERS AS AN ADULT  . Atrial fibrillation ([place]) 08/2019  . Cancer of prostate [facility]) 2012   Robotic Assisted prostatectomy 2012 [facility]  . Gout   . Gout   . History of chicken pox   . History of kidney stones    H/O  . History of measles   . History of mumps   . Panic attacks   . Sleep apnea    using CPAP occasionally     Past Surgical History:  Procedure Laterality Date  .  CHOLECYSTECTOMY N/A 09/18/2017   Procedure: LAPAROSCOPIC CHOLECYSTECTOMY WITH INTRAOPERATIVE CHOLANGIOGRAM;  Surgeon: Robert Bellow, MD;  Location: [facility] ORS;  Service: General;  Laterality: N/A;  . COLONOSCOPY  Jan 2003, 12-17-13   Dr Bary Castilla  . kidney stone extraction  07/2013   stent placed  . PROSTATE SURGERY  2012  . PROSTATECTOMY  05/16/2011   [facility]. Abdominal Laparoscopic, robotic assisted    Current Medications: Current Meds  Medication Sig  . acetaminophen (TYLENOL) 650 MG CR tablet Take 1,300 mg by mouth every 8 (eight) hours as needed for pain.  Marland Kitchen amoxicillin-clavulanate (AUGMENTIN) 875-125 MG tablet Take 1 tablet by mouth 2 (two) times daily.  . metoprolol tartrate (LOPRESSOR) 25 MG tablet Take 1 tablet (25 mg total) by mouth 2 (two) times daily.     Allergies:   Patient has no known allergies.   Social History   Socioeconomic History  . Marital status: Married    Spouse name: Not on file  . Number of children: 2  . Years of education: Not on file  . Highest education level: Not on file  Occupational History  . Occupation: Archivist  Social Needs  . Financial resource strain: Not on file  .  Food insecurity    Worry: Not on file    Inability: Not on file  . Transportation needs    Medical: Not on file    Non-medical: Not on file  Tobacco Use  . Smoking status: Never Smoker  . Smokeless tobacco: Never Used  Substance and Sexual Activity  . Alcohol use: Yes    Alcohol/week: 0.0 standard drinks    Comment: occasional use  . Drug use: No  . Sexual activity: Not on file  Lifestyle  . Physical activity    Days per week: Not on file    Minutes per session: Not on file  . Stress: Not on file  Relationships  . Social Herbalist on phone: Not on file    Gets together: Not on file    Attends religious service: Not on file    Active member of club or organization: Not on file    Attends meetings of clubs or organizations: Not on file     Relationship status: Not on file  Other Topics Concern  . Not on file  Social History Narrative  . Not on file     Family History: The patient's family history includes Anemia in his mother; CAD in his father and mother; Cancer in his mother; Colon cancer in his mother; Congestive Heart Failure in his father; Emphysema in his father; Tuberculosis in his father.  ROS:   Please see the history of present illness.     All other systems reviewed and are negative.  EKGs/Labs/Other Studies Reviewed:    The following studies were reviewed today:   EKG:  EKG is  ordered today.  The ekg ordered today demonstrates atrial fibrillation, heart rate 87.  Recent Labs: 09/02/2019: BUN 18; Creatinine, Ser 1.00; Hemoglobin 15.4; Platelets 246; Potassium 4.1; Sodium 140  Recent Lipid Panel    Component Value Date/Time   CHOL 149 03/24/2016 0901   TRIG 56 03/24/2016 0901   HDL 38 (L) 03/24/2016 0901   CHOLHDL 3.9 03/24/2016 0901   LDLCALC 100 (H) 03/24/2016 0901    Physical Exam:    VS:  BP 110/70 (BP Location: Right Arm, Patient Position: Sitting, Cuff Size: Large)   Pulse 87   Temp 98.1 F (36.7 C)   Ht 6' (1.829 m)   Wt (!) 357 lb 12 oz (162.3 kg)   SpO2 93%   BMI 48.52 kg/m     Wt Readings from Last 3 Encounters:  09/09/19 (!) 357 lb 12 oz (162.3 kg)  09/02/19 (!) 345 lb (156.5 kg)  01/06/19 (!) 357 lb (161.9 kg)     GEN:  Well nourished, well developed in no acute distress, obese HEENT: Normal NECK: No JVD; No carotid bruits LYMPHATICS: No lymphadenopathy CARDIAC: Irregular irregular, distant heart sounds RESPIRATORY: Clear anteriorly, decreased at bases. ABDOMEN: Soft, non-tender, non-distended MUSCULOSKELETAL:  No edema; No deformity  SKIN: Warm and dry NEUROLOGIC:  Alert and oriented x 3 PSYCHIATRIC:  Normal affect   ASSESSMENT:   New onset persistent atrial fibrillation, chadsvasc score of 1 (age).  Patient has obstructive sleep apnea, morbid obesity.  Functionally  active. 1. Atrial fibrillation, unspecified type ([place])   2. Morbid obesity with BMI of 45.0-49.9, adult ([place])   3. Sleep apnea, unspecified type    PLAN:    In order of problems listed above:  1. Lopressor 25 mg twice daily.  Eliquis 5 mg twice daily.  Patient given Eliquis co-pay cards to help with cost.  Get echocardiogram.  If echocardiogram is without structural abnormalities, may consider cardioversion after 4 weeks of anticoagulation. 2. Weight loss advised.  Patient advised to see weight loss specialist/nutritionist via PCP referral. 3. Encourage patient to make appointment with sleep specialist for reevaluation and consider mask refitting.  Follow-up after echo.   Medication Adjustments/Labs and Tests Ordered: Current medicines are reviewed at length with the patient today.  Concerns regarding medicines are outlined above.  Orders Placed This Encounter  Procedures  . EKG 12-Lead  . ECHOCARDIOGRAM COMPLETE   No orders of the defined types were placed in this encounter.   Patient Instructions  Medication Instructions:  Your physician recommends that you continue on your current medications as directed. Please refer to the Current Medication list given to you today.  If you need a refill on your cardiac medications before your next appointment, please call your pharmacy.   Lab work: None ordered  If you have labs (blood work) drawn today and your tests are completely normal, you will receive your results only by: Marland Kitchen MyChart Message (if you have MyChart) OR . A paper copy in the mail If you have any lab test that is abnormal or we need to change your treatment, we will call you to review the results.  Testing/Procedures: 1- Echo  Please return to [facility] on ______________ at _______________ AM/PM for an Echocardiogram. Your physician has requested that you have an echocardiogram. Echocardiography is a painless test that uses sound waves to create images  of your heart. It provides your doctor with information about the size and shape of your heart and how well your heart's chambers and valves are working. This procedure takes approximately one hour. There are no restrictions for this procedure. Please note; depending on visual quality an IV may need to be placed.    Follow-Up: At [facility], you and your health needs are our priority.  As part of our continuing mission to provide you with exceptional heart care, we have created designated Provider Care Teams.  These Care Teams include your primary Cardiologist (physician) and Advanced Practice Providers (APPs -  Physician Assistants and Nurse Practitioners) who all work together to provide you with the care you need, when you need it. You will need a follow up appointment in 4-6 weeks.  You may see Kate Sable, MD or one of the following Advanced Practice Providers on your designated Care Team:   Murray Hodgkins, NP Christell Faith, PA-C . Marrianne Mood, PA-C     Signed, Kate Sable, MD  09/09/2019 9:33 AM    [place]

## 2019-09-09 NOTE — Patient Instructions (Signed)
Medication Instructions:  Your physician recommends that you continue on your current medications as directed. Please refer to the Current Medication list given to you today.  If you need a refill on your cardiac medications before your next appointment, please call your pharmacy.   Lab work: None ordered  If you have labs (blood work) drawn today and your tests are completely normal, you will receive your results only by: Marland Kitchen MyChart Message (if you have MyChart) OR . A paper copy in the mail If you have any lab test that is abnormal or we need to change your treatment, we will call you to review the results.  Testing/Procedures: 1- Echo  Please return to [facility] on ______________ at _______________ AM/PM for an Echocardiogram. Your physician has requested that you have an echocardiogram. Echocardiography is a painless test that uses sound waves to create images of your heart. It provides your doctor with information about the size and shape of your heart and how well your heart's chambers and valves are working. This procedure takes approximately one hour. There are no restrictions for this procedure. Please note; depending on visual quality an IV may need to be placed.    Follow-Up: At [facility], you and your health needs are our priority.  As part of our continuing mission to provide you with exceptional heart care, we have created designated Provider Care Teams.  These Care Teams include your primary Cardiologist (physician) and Advanced Practice Providers (APPs -  Physician Assistants and Nurse Practitioners) who all work together to provide you with the care you need, when you need it. You will need a follow up appointment in 4-6 weeks.  You may see Kate Sable, MD or one of the following Advanced Practice Providers on your designated Care Team:   Murray Hodgkins, NP Christell Faith, PA-C . Marrianne Mood, PA-C

## 2019-09-10 ENCOUNTER — Ambulatory Visit: Payer: Medicare Other

## 2019-09-15 ENCOUNTER — Ambulatory Visit: Payer: Medicare Other | Admitting: Internal Medicine

## 2019-09-17 ENCOUNTER — Other Ambulatory Visit: Payer: Self-pay

## 2019-09-17 ENCOUNTER — Ambulatory Visit (INDEPENDENT_AMBULATORY_CARE_PROVIDER_SITE_OTHER): Payer: Medicare Other

## 2019-09-17 DIAGNOSIS — I4891 Unspecified atrial fibrillation: Secondary | ICD-10-CM | POA: Diagnosis not present

## 2019-09-17 MED ORDER — PERFLUTREN LIPID MICROSPHERE
1.0000 mL | INTRAVENOUS | Status: AC | PRN
  Administered 2019-09-17: 2 mL via INTRAVENOUS

## 2019-10-09 ENCOUNTER — Telehealth: Payer: Self-pay | Admitting: Cardiology

## 2019-10-09 NOTE — Telephone Encounter (Signed)
Patient would like a letter stating with his heart condition he is unable to serve jury duty.

## 2019-10-09 NOTE — Telephone Encounter (Signed)
Rsqt fwd to Dr. Mylo Red -Charlestine Night for approval.

## 2019-10-10 NOTE — Telephone Encounter (Signed)
Spoke with the patient and made him aware of Dr. Thereasa Solo response. Advised the patient that if he has other medical dx he may want to talk with his pcp regarding the letter to be excused from jury duty.  Patient verbalized understanding and voiced appreciation for the call back.

## 2019-10-23 ENCOUNTER — Other Ambulatory Visit: Payer: Medicare Other

## 2019-10-23 ENCOUNTER — Ambulatory Visit: Payer: Medicare Other | Admitting: Cardiology

## 2019-11-03 ENCOUNTER — Encounter: Payer: Self-pay | Admitting: Cardiology

## 2019-11-03 ENCOUNTER — Other Ambulatory Visit: Payer: Self-pay

## 2019-11-03 ENCOUNTER — Other Ambulatory Visit
Admission: RE | Admit: 2019-11-03 | Discharge: 2019-11-03 | Disposition: A | Discharge status: Home / Self Care | Payer: Medicare Other | Source: Ambulatory Visit | Attending: Cardiology | Admitting: Cardiology

## 2019-11-03 ENCOUNTER — Ambulatory Visit (INDEPENDENT_AMBULATORY_CARE_PROVIDER_SITE_OTHER): Payer: Medicare Other | Admitting: Cardiology

## 2019-11-03 VITALS — BP 100/80 | HR 90 | Temp 98.8°F | Ht 71.5 in | Wt 361.2 lb

## 2019-11-03 DIAGNOSIS — G4730 Sleep apnea, unspecified: Secondary | ICD-10-CM

## 2019-11-03 DIAGNOSIS — I4891 Unspecified atrial fibrillation: Secondary | ICD-10-CM | POA: Insufficient documentation

## 2019-11-03 DIAGNOSIS — Z6841 Body Mass Index (BMI) 40.0 and over, adult: Secondary | ICD-10-CM

## 2019-11-03 LAB — T4, FREE: Free T4: 0.77 ng/dL (ref 0.61–1.12)

## 2019-11-03 LAB — TSH: TSH: 4.142 u[IU]/mL (ref 0.350–4.500)

## 2019-11-03 NOTE — Patient Instructions (Signed)
Medication Instructions:  Your physician recommends that you continue on your current medications as directed. Please refer to the Current Medication list given to you today.  *If you need a refill on your cardiac medications before your next appointment, please call your pharmacy*  Lab Work: Tsh and Free T4.  Please have your lab drawn at the [facility] medical mall. You do not need an appointment. Their hours are Mon-Fri 7am-6pm. You do not need to fast If you have labs (blood work) drawn today and your tests are completely normal, you will receive your results only by: Marland Kitchen MyChart Message (if you have MyChart) OR . A paper copy in the mail If you have any lab test that is abnormal or we need to change your treatment, we will call you to review the results.  Testing/Procedures: None ordered  Follow-Up: At [facility], you and your health needs are our priority.  As part of our continuing mission to provide you with exceptional heart care, we have created designated Provider Care Teams.  These Care Teams include your primary Cardiologist (physician) and Advanced Practice Providers (APPs -  Physician Assistants and Nurse Practitioners) who all work together to provide you with the care you need, when you need it.  Your next appointment:   4 week(s)  The format for your next appointment:   In Person  Provider:    You may see Kate Sable, MD or one of the following Advanced Practice Providers on your designated Care Team:    Murray Hodgkins, NP  Christell Faith, PA-C  Marrianne Mood, PA-C   Other Instructions You have been referred to [facility] in [place]. They will contact you directly to schedule.

## 2019-11-03 NOTE — Progress Notes (Signed)
Cardiology Office Note:    Date:  11/03/2019   ID:  Charles Floyd, DOB 05/21/1951, MRN [id number]  PCP:  Margo Common, PA  Cardiologist:  Kate Sable, MD  Electrophysiologist:  None   Referring MD: Birdie Sons, MD   Chief Complaint  Patient presents with  . office visit    6 week F/U after echo; Meds verbally reviewed with patient.    History of Present Illness:    Charles Floyd is a 68 y.o. male with a hx of OSA on CPAP, atrial fibrillation, obesity who presents for a 6 weeks follow-up.  He was originally seen as a follow-up from the ED due to atrial fibrillation.    On prior visit, patient was having symptoms of congestion and cough.  He was on antibiotics and steroids.  His apple watch told him his heart rhythm was irregular and he was was in atrial fibrillation. He then called his PCP who advised him to go to the emergency room.  In the emergency room patient was noted to be in atrial fibrillation with controlled ventricular response.  He was placed on Lopressor 25 mg twice daily and Eliquis 5 mg twice daily.   He was not compliant with his CPAP and was advised to have his mask refitted but he bought an nasal oxygen supply through [employer].  He still feels very somnolent during the day.  Echocardiogram was ordered after last visit.  Patient states running out of Eliquis 2 weeks ago due to high cost, and lack of drug coverage.  He states his drug coverage insurance plan is supposed to start tomorrow which will help tremendously with the cost.  Past Medical History:  Diagnosis Date  . Arthritis    BACK  . Asthma    AS A CHILD-NO INHALERS AS AN ADULT  . Atrial fibrillation ([place]) 08/2019  . Cancer of prostate [facility]) 2012   Robotic Assisted prostatectomy 2012 [facility]  . Gout   . Gout   . History of chicken pox   . History of kidney stones    H/O  . History of measles   . History of mumps   . Panic attacks   . Sleep apnea    using CPAP occasionally      Past Surgical History:  Procedure Laterality Date  . CHOLECYSTECTOMY N/A 09/18/2017   Procedure: LAPAROSCOPIC CHOLECYSTECTOMY WITH INTRAOPERATIVE CHOLANGIOGRAM;  Surgeon: Robert Bellow, MD;  Location: [facility] ORS;  Service: General;  Laterality: N/A;  . COLONOSCOPY  Jan 2003, 12-17-13   Dr Bary Castilla  . kidney stone extraction  07/2013   stent placed  . PROSTATE SURGERY  2012  . PROSTATECTOMY  05/16/2011   [facility]. Abdominal Laparoscopic, robotic assisted    Current Medications: Current Meds  Medication Sig  . acetaminophen (TYLENOL) 650 MG CR tablet Take 1,300 mg by mouth every 8 (eight) hours as needed for pain.  . metoprolol tartrate (LOPRESSOR) 25 MG tablet Take 1 tablet (25 mg total) by mouth 2 (two) times daily.     Allergies:   Patient has no known allergies.   Social History   Socioeconomic History  . Marital status: Married    Spouse name: Not on file  . Number of children: 2  . Years of education: Not on file  . Highest education level: Not on file  Occupational History  . Occupation: Archivist  Social Needs  . Financial resource strain: Not on file  . Food insecurity    Worry:  Not on file    Inability: Not on file  . Transportation needs    Medical: Not on file    Non-medical: Not on file  Tobacco Use  . Smoking status: Never Smoker  . Smokeless tobacco: Never Used  Substance and Sexual Activity  . Alcohol use: Yes    Alcohol/week: 0.0 standard drinks    Comment: occasional use  . Drug use: No  . Sexual activity: Not on file  Lifestyle  . Physical activity    Days per week: Not on file    Minutes per session: Not on file  . Stress: Not on file  Relationships  . Social Herbalist on phone: Not on file    Gets together: Not on file    Attends religious service: Not on file    Active member of club or organization: Not on file    Attends meetings of clubs or organizations: Not on file    Relationship status: Not on file  Other Topics  Concern  . Not on file  Social History Narrative  . Not on file     Family History: The patient's family history includes Anemia in his mother; CAD in his father and mother; Cancer in his mother; Colon cancer in his mother; Congestive Heart Failure in his father; Emphysema in his father; Tuberculosis in his father.  ROS:   Please see the history of present illness.     All other systems reviewed and are negative.  EKGs/Labs/Other Studies Reviewed:    The following studies were reviewed today: TTE 2019-10-12 1. Left ventricular ejection fraction, by visual estimation, is 55 to 60%. The left ventricle has normal function. Normal left ventricular size. There is borderline left ventricular hypertrophy.  2. Left ventricular diastolic function could not be evaluated pattern of LV diastolic filling.  3. Global right ventricle has normal systolic function.The right ventricular size is normal. Right vetricular wall thickness was not assessed.  4. Left atrial size was normal.  5. Right atrial size was normal.  6. The mitral valve is normal in structure. Trace mitral valve regurgitation.  7. The tricuspid valve is normal in structure. Tricuspid valve regurgitation is mild.  8. The aortic valve is normal in structure. Aortic valve regurgitation was not visualized by color flow Doppler.  9. The pulmonic valve was not well visualized. Pulmonic valve regurgitation is not visualized by color flow Doppler. 10. Mildly elevated pulmonary artery systolic pressure.  EKG:  EKG is  ordered today.  The ekg ordered today demonstrates atrial fibrillation, heart rate 90.  Recent Labs: 09/02/2019: BUN 18; Creatinine, Ser 1.00; Hemoglobin 15.4; Platelets 246; Potassium 4.1; Sodium 140  Recent Lipid Panel    Component Value Date/Time   CHOL 149 03/24/2016 0901   TRIG 56 03/24/2016 0901   HDL 38 (L) 03/24/2016 0901   CHOLHDL 3.9 03/24/2016 0901   LDLCALC 100 (H) 03/24/2016 0901    Physical Exam:    VS:   BP 100/80 (BP Location: Left Arm, Patient Position: Sitting, Cuff Size: Large)   Pulse 90   Temp 98.8 F (37.1 C)   Ht 5' 11.5" (1.816 m)   Wt (!) 361 lb 4 oz (163.9 kg)   SpO2 94%   BMI 49.68 kg/m     Wt Readings from Last 3 Encounters:  11/03/19 (!) 361 lb 4 oz (163.9 kg)  09/09/19 (!) 357 lb 12 oz (162.3 kg)  09/02/19 (!) 345 lb (156.5 kg)     GEN:  Well nourished, well developed in no acute distress, obese HEENT: Normal NECK: No JVD; No carotid bruits LYMPHATICS: No lymphadenopathy CARDIAC: Irregular irregular, distant heart sounds RESPIRATORY: Clear anteriorly, decreased at bases. ABDOMEN: Soft, non-tender, non-distended MUSCULOSKELETAL:  No edema; No deformity  SKIN: Warm and dry NEUROLOGIC:  Alert and oriented x 3 PSYCHIATRIC:  Normal affect   ASSESSMENT:   Echocardiogram shows normal ejection fraction with EF 55 to 60%.  Normal LA size.  Normal RA size.  New onset persistent atrial fibrillation, chadsvasc score of 1 (age).  Patient has obstructive sleep apnea, morbid obesity.  Functionally active. 1. Atrial fibrillation, unspecified type ([place])   2. Morbid obesity with BMI of 45.0-49.9, adult ([place])   3. Sleep apnea, unspecified type    PLAN:    In order of problems listed above:  1. Lopressor 25 mg twice daily. Eliquis 5 mg twice daily.  Patient given Eliquis co-pay card and available samples.  Hopefully his insurance is able to cover for medication due to cost issues.  After we make sure cost and compliance is not an issue, we will plan for TEE cardioversion.  Get TSH and free T4. 2. Weight loss advised.  Patient advised to see weight loss specialist/nutritionist via PCP referral. 3. We will refer patient to pulmonary medicine/sleep apnea specialist for appropriate mask fitting and management of OSA.  Follow-up in 4 weeks   Medication Adjustments/Labs and Tests Ordered: Current medicines are reviewed at length with the patient today.  Concerns regarding medicines  are outlined above.  Orders Placed This Encounter  Procedures  . TSH  . T4, free  . Ambulatory referral to Pulmonology  . EKG 12-Lead   No orders of the defined types were placed in this encounter.   Patient Instructions  Medication Instructions:  Your physician recommends that you continue on your current medications as directed. Please refer to the Current Medication list given to you today.  *If you need a refill on your cardiac medications before your next appointment, please call your pharmacy*  Lab Work: Tsh and Free T4.  Please have your lab drawn at the [facility] medical mall. You do not need an appointment. Their hours are Mon-Fri 7am-6pm. You do not need to fast If you have labs (blood work) drawn today and your tests are completely normal, you will receive your results only by: Marland Kitchen MyChart Message (if you have MyChart) OR . A paper copy in the mail If you have any lab test that is abnormal or we need to change your treatment, we will call you to review the results.  Testing/Procedures: None ordered  Follow-Up: At [facility], you and your health needs are our priority.  As part of our continuing mission to provide you with exceptional heart care, we have created designated Provider Care Teams.  These Care Teams include your primary Cardiologist (physician) and Advanced Practice Providers (APPs -  Physician Assistants and Nurse Practitioners) who all work together to provide you with the care you need, when you need it.  Your next appointment:   4 week(s)  The format for your next appointment:   In Person  Provider:    You may see Kate Sable, MD or one of the following Advanced Practice Providers on your designated Care Team:    Murray Hodgkins, NP  Christell Faith, PA-C  Marrianne Mood, PA-C   Other Instructions You have been referred to [facility] in [place]. They will contact you directly to schedule.  Signed, Kate Sable,  MD  11/03/2019 11:37 AM    [place]

## 2019-11-17 NOTE — Progress Notes (Addendum)
Subjective:   Charles Floyd is a 67 y.o. male who presents for an Initial Medicare Annual Wellness Visit.    This visit is being conducted through telemedicine due to the COVID-19 pandemic. This patient has given me verbal consent via doximity to conduct this visit, patient states they are participating from their home address. Some vital signs may be absent or patient reported.    Patient identification: identified by name, DOB, and current address  Review of Systems  N/A  Cardiac Risk Factors include: advanced age (>45men, >11 women);male gender;obesity (BMI >30kg/m2);hypertension    Objective:    Today's Vitals   11/18/19 1520  PainSc: 6    There is no height or weight on file to calculate BMI. Unable to obtain vitals due to visit being conducted via telephonically.   Advanced Directives 11/18/2019 09/02/2019 09/18/2017 09/17/2017  Does Patient Have a Medical Advance Directive? No No No No  Would patient like information on creating a medical advance directive? No - Patient declined No - Patient declined No - Patient declined -    Current Medications (verified) Outpatient Encounter Medications as of 11/18/2019  Medication Sig   acetaminophen (TYLENOL) 650 MG CR tablet Take 1,300 mg by mouth every 8 (eight) hours as needed for pain.   allopurinol (ZYLOPRIM) 300 MG tablet Take 300 mg by mouth daily. As needed for gout   apixaban (ELIQUIS) 5 MG TABS tablet Take 1 tablet (5 mg total) by mouth 2 (two) times daily.   metoprolol tartrate (LOPRESSOR) 25 MG tablet Take 1 tablet (25 mg total) by mouth 2 (two) times daily.   amoxicillin-clavulanate (AUGMENTIN) 875-125 MG tablet Take 1 tablet by mouth 2 (two) times daily. (Patient not taking: Reported on 11/03/2019)   predniSONE (DELTASONE) 20 MG tablet One pill twice daily for 5 days (Patient not taking: Reported on 09/09/2019)   No facility-administered encounter medications on file as of 11/18/2019.    Allergies  (verified) Patient has no known allergies.   History: Past Medical History:  Diagnosis Date   Arthritis    BACK   Asthma    AS A CHILD-NO INHALERS AS AN ADULT   Atrial fibrillation ([place]) 08/2019   Cancer of prostate [facility]) 2012   Robotic Assisted prostatectomy 2012 [facility]   Gout    Gout    History of chicken pox    History of kidney stones    H/O   History of measles    History of mumps    Panic attacks    Sleep apnea    using CPAP occasionally    Past Surgical History:  Procedure Laterality Date   CHOLECYSTECTOMY N/A 09/18/2017   Procedure: LAPAROSCOPIC CHOLECYSTECTOMY WITH INTRAOPERATIVE CHOLANGIOGRAM;  Surgeon: Robert Bellow, MD;  Location: [facility] ORS;  Service: General;  Laterality: N/A;   COLONOSCOPY  Jan 2003, 12-17-13   Dr Bary Castilla   kidney stone extraction  07/2013   stent placed   PROSTATE SURGERY  2012   PROSTATECTOMY  05/16/2011   [facility]. Abdominal Laparoscopic, robotic assisted   Family History  Problem Relation Age of Onset   Cancer Mother        colon and breast   CAD Mother    Anemia Mother    Colon cancer Mother    Congestive Heart Failure Father    Tuberculosis Father    CAD Father    Emphysema Father    Social History   Socioeconomic History   Marital status: Married    Spouse  name: Not on file   Number of children: 2   Years of education: Not on file   Highest education level: Master's degree (e.g., MA, MS, MEng, MEd, MSW, MBA)  Occupational History   Occupation: Archivist  Tobacco Use   Smoking status: Never Smoker   Smokeless tobacco: Never Used  Substance and Sexual Activity   Alcohol use: Yes    Alcohol/week: 0.0 standard drinks    Comment: occasional use   Drug use: No   Sexual activity: Not on file  Other Topics Concern   Not on file  Social History Narrative   Not on file   Social Determinants of Health   Financial Resource Strain:    Difficulty of Paying Living Expenses: Not on file  Food Insecurity:    Worried About  [place] in the Last Year: Not on file   Ran Out of Food in the Last Year: Not on file  Transportation Needs:    Lack of Transportation (Medical): Not on file   Lack of Transportation (Non-Medical): Not on file  Physical Activity:    Days of Exercise per Week: Not on file   Minutes of Exercise per Session: Not on file  Stress:    Feeling of Stress : Not on file  Social Connections:    Frequency of Communication with Friends and Family: Not on file   Frequency of Social Gatherings with Friends and Family: Not on file   Attends Religious Services: Not on file   Active Member of Clubs or Organizations: Not on file   Attends Archivist Meetings: Not on file   Marital Status: Not on file   Tobacco Counseling Counseling given: Not Answered   Clinical Intake:  Pre-visit preparation completed: Yes  Pain : 0-10 Pain Score: 6  Pain Type: Chronic pain Pain Location: Back(and hip) Pain Orientation: Left, Lower Pain Descriptors / Indicators: Aching Pain Frequency: Constant     Nutritional Risks: None Diabetes: No  How often do you need to have someone help you when you read instructions, pamphlets, or other written materials from your doctor or pharmacy?: 1 - Never  Interpreter Needed?: No  Information entered by :: [facility], LPN  Activities of Daily Living In your present state of health, do you have any difficulty performing the following activities: 11/18/2019  Hearing? N  Vision? N  Difficulty concentrating or making decisions? N  Walking or climbing stairs? Y  Comment Due to back/hip pain and SOB.  Dressing or bathing? N  Doing errands, shopping? N  Preparing Food and eating ? N  Using the Toilet? N  In the past six months, have you accidently leaked urine? Y  Comment Occasionally due to hx of prostate cancer. and removal.  Do you have problems with loss of bowel control? N  Managing your Medications? N  Managing your Finances? N    Housekeeping or managing your Housekeeping? N  Some recent data might be hidden     Immunizations and Health Maintenance Immunization History  Administered Date(s) Administered   Influenza Split 10/14/2012   Influenza, High Dose Seasonal PF 09/19/2017   Pneumococcal Conjugate-13 03/23/2016   Pneumococcal Polysaccharide-23 09/19/2017   Tdap 03/23/2016   Zoster 10/14/2012   Health Maintenance Due  Topic Date Due   Hepatitis C Screening  1951-07-31   COLONOSCOPY  12/17/2018   INFLUENZA VACCINE  07/05/2019    Patient Care Team: Chrismon, Vickki Muff, PA as PCP - General (Family Medicine) Kate Sable, MD as  PCP - Cardiology (Cardiology) Bary Castilla, Forest Gleason, MD (General Surgery)  Indicate any recent Medical Services you may have received from other than [facility] providers in the past year (date may be approximate).    Assessment:   This is a routine wellness examination for [name].  Hearing/Vision screen No exam data present  Dietary issues and exercise activities discussed: Current Exercise Habits: The patient does not participate in regular exercise at present, Exercise limited by: orthopedic condition(s);respiratory conditions(s)  Goals      DIET - INCREASE WATER INTAKE     Recommend to drink at least 6-8 8oz glasses of water per day.       Depression Screen PHQ 2/9 Scores 11/18/2019 01/06/2019 08/23/2017 03/23/2016  PHQ - 2 Score 0 0 1 0    Fall Risk Fall Risk  11/18/2019 01/06/2019 08/23/2017 03/23/2016  Falls in the past year? 0 0 No Yes  Number falls in past yr: 0 - - 2 or more  Injury with Fall? 0 - - No    FALL RISK PREVENTION PERTAINING TO THE HOME:  Any stairs in or around the home? No  If so, are there any without handrails?  N/A  Home free of loose throw rugs in walkways, pet beds, electrical cords, etc? Yes  Adequate lighting in your home to reduce risk of falls? Yes   ASSISTIVE DEVICES UTILIZED TO PREVENT FALLS:  Life alert? No  Use of a cane,  walker or w/c? Yes  Grab bars in the bathroom? Yes  Shower chair or bench in shower? No  Elevated toilet seat or a handicapped toilet? Yes    TIMED UP AND GO:  Was the test performed? No .    Cognitive Function: Declined today.        Screening Tests Health Maintenance  Topic Date Due   Hepatitis C Screening  Mar 23, 1951   COLONOSCOPY  12/17/2018   INFLUENZA VACCINE  07/05/2019   TETANUS/TDAP  03/23/2026   PNA vac Low Risk Adult  Completed    Qualifies for Shingles Vaccine? Yes  Zostavax completed 10/14/12. Due for Shingrix. Pt has been advised to call insurance company to determine out of pocket expense. Advised may also receive vaccine at local pharmacy or Health Dept. Verbalized acceptance and understanding.  Tdap: Up to date  Flu Vaccine: Due for Flu vaccine. Does the patient want to receive this vaccine today?  No .   Pneumococcal Vaccine: Completed series  Cancer Screenings:  Colorectal Screening: Completed 12/17/13. Repeat every 5 years. Pt declined a colonoscopy referral today.   Lung Cancer Screening: (Low Dose CT Chest recommended if Age 76-80 years, 30 pack-year currently smoking OR have quit w/in 15years.) does not qualify.   Additional Screening:  Hepatitis C Screening: does qualify; however declined a future order for this lab.  Vision Screening: Recommended annual ophthalmology exams for early detection of glaucoma and other disorders of the eye.  Dental Screening: Recommended annual dental exams for proper oral hygiene  Community Resource Referral:  CRR required this visit?  No        Plan:  I have personally reviewed and addressed the Medicare Annual Wellness questionnaire and have noted the following in the patient's chart:  A. Medical and social history B. Use of alcohol, tobacco or illicit drugs  C. Current medications and supplements D. Functional ability and status E.  Nutritional status F.  Physical activity G. Advance  directives H. List of other physicians I.  Hospitalizations, surgeries, and ER visits in previous  12 months J.  [place] such as hearing and vision if needed, cognitive and depression L. Referrals and appointments   In addition, I have reviewed and discussed with patient certain preventive protocols, quality metrics, and best practice recommendations. A written personalized care plan for preventive services as well as general preventive health recommendations were provided to patient.   [place] Score, [place]   [id number]  Nurse Health Advisor   Nurse Notes: Pt declined a future influenza vaccine or Hep C lab order. Also declines a colonoscopy referral at this time.   Reviewed Nurse Health Advisor's note and recommendations. Agree with documentation and plan.

## 2019-11-18 ENCOUNTER — Other Ambulatory Visit: Payer: Self-pay

## 2019-11-18 ENCOUNTER — Ambulatory Visit (INDEPENDENT_AMBULATORY_CARE_PROVIDER_SITE_OTHER): Payer: Medicare Other

## 2019-11-18 DIAGNOSIS — Z0000 Encounter for general adult medical examination without abnormal findings: Secondary | ICD-10-CM | POA: Diagnosis not present

## 2019-11-18 NOTE — Telephone Encounter (Signed)
Spoke with pt today and completed his AWV telephonically. Pt inquired about getting a refill on Allopurinol 300 mg to take daily as needed for gout. Pt states Mariel Sleet prescribed this for him awhile back and he has finished the medication. Pt states he has been controlling his gout flare ups by changing his diet but would still like to have a prescription on hand PRN. Pt requests the rx be sent to the pharmacy on file. Pt declined a follow up apt with you at this time. Please advise, thank you.

## 2019-11-18 NOTE — Patient Instructions (Addendum)
Charles Floyd , Thank you for taking time to come for your Medicare Wellness Visit. I appreciate your ongoing commitment to your health goals. Please review the following plan we discussed and let me know if I can assist you in the future.   Screening recommendations/referrals: Colonoscopy: Currently due. Pt declines referral today.  Recommended yearly ophthalmology/optometry visit for glaucoma screening and checkup Recommended yearly dental visit for hygiene and checkup  Vaccinations: Influenza vaccine: Currently due. Pt declines receiving this vaccine at this time. Pneumococcal vaccine: Completed series Tdap vaccine: Up to date, due 03/2026 Shingles vaccine: Pt declines today.     Advanced directives: Advance directive discussed with you today. Even though you declined this today please call our office should you change your mind and we can give you the proper paperwork for you to fill out.  Conditions/risks identified: Recommend to drink at least 6-8 8oz glasses of water per day.  Next appointment: None. Pt declined scheduling a follow up with PCP or an AWV for 2021 at this time.   Preventive Care 68 Years and Older, Male Preventive care refers to lifestyle choices and visits with your health care provider that can promote health and wellness. What does preventive care include?  A yearly physical exam. This is also called an annual well check.  Dental exams once or twice a year.  Routine eye exams. Ask your health care provider how often you should have your eyes checked.  Personal lifestyle choices, including:  Daily care of your teeth and gums.  Regular physical activity.  Eating a healthy diet.  Avoiding tobacco and drug use.  Limiting alcohol use.  Practicing safe sex.  Taking low doses of aspirin every day.  Taking vitamin and mineral supplements as recommended by your health care provider. What happens during an annual well check? The services and screenings done  by your health care provider during your annual well check will depend on your age, overall health, lifestyle risk factors, and family history of disease. Counseling  Your health care provider may ask you questions about your:  Alcohol use.  Tobacco use.  Drug use.  Emotional well-being.  Home and relationship well-being.  Sexual activity.  Eating habits.  History of falls.  Memory and ability to understand (cognition).  Work and work Statistician. Screening  You may have the following tests or measurements:  Height, weight, and BMI.  Blood pressure.  Lipid and cholesterol levels. These may be checked every 5 years, or more frequently if you are over 98 years old.  Skin check.  Lung cancer screening. You may have this screening every year starting at age 36 if you have a 30-pack-year history of smoking and currently smoke or have quit within the past 15 years.  Fecal occult blood test (FOBT) of the stool. You may have this test every year starting at age 15.  Flexible sigmoidoscopy or colonoscopy. You may have a sigmoidoscopy every 5 years or a colonoscopy every 10 years starting at age 39.  Prostate cancer screening. Recommendations will vary depending on your family history and other risks.  Hepatitis C blood test.  Hepatitis B blood test.  Sexually transmitted disease (STD) testing.  Diabetes screening. This is done by checking your blood sugar (glucose) after you have not eaten for a while (fasting). You may have this done every 1-3 years.  Abdominal aortic aneurysm (AAA) screening. You may need this if you are a current or former smoker.  Osteoporosis. You may be screened starting at  age 48 if you are at high risk. Talk with your health care provider about your test results, treatment options, and if necessary, the need for more tests. Vaccines  Your health care provider may recommend certain vaccines, such as:  Influenza vaccine. This is recommended every  year.  Tetanus, diphtheria, and acellular pertussis (Tdap, Td) vaccine. You may need a Td booster every 10 years.  Zoster vaccine. You may need this after age 70.  Pneumococcal 13-valent conjugate (PCV13) vaccine. One dose is recommended after age 26.  Pneumococcal polysaccharide (PPSV23) vaccine. One dose is recommended after age 55. Talk to your health care provider about which screenings and vaccines you need and how often you need them. This information is not intended to replace advice given to you by your health care provider. Make sure you discuss any questions you have with your health care provider. Document Released: 12/17/2015 Document Revised: 08/09/2016 Document Reviewed: 09/21/2015 Elsevier Interactive Patient Education  2017 [place] Prevention in the Home Falls can cause injuries. They can happen to people of all ages. There are many things you can do to make your home safe and to help prevent falls. What can I do on the outside of my home?  Regularly fix the edges of walkways and driveways and fix any cracks.  Remove anything that might make you trip as you walk through a door, such as a raised step or threshold.  Trim any bushes or trees on the path to your home.  Use bright outdoor lighting.  Clear any walking paths of anything that might make someone trip, such as rocks or tools.  Regularly check to see if handrails are loose or broken. Make sure that both sides of any steps have handrails.  Any raised decks and porches should have guardrails on the edges.  Have any leaves, snow, or ice cleared regularly.  Use sand or salt on walking paths during winter.  Clean up any spills in your garage right away. This includes oil or grease spills. What can I do in the bathroom?  Use night lights.  Install grab bars by the toilet and in the tub and shower. Do not use towel bars as grab bars.  Use non-skid mats or decals in the tub or shower.  If you  need to sit down in the shower, use a plastic, non-slip stool.  Keep the floor dry. Clean up any water that spills on the floor as soon as it happens.  Remove soap buildup in the tub or shower regularly.  Attach bath mats securely with double-sided non-slip rug tape.  Do not have throw rugs and other things on the floor that can make you trip. What can I do in the bedroom?  Use night lights.  Make sure that you have a light by your bed that is easy to reach.  Do not use any sheets or blankets that are too big for your bed. They should not hang down onto the floor.  Have a firm chair that has side arms. You can use this for support while you get dressed.  Do not have throw rugs and other things on the floor that can make you trip. What can I do in the kitchen?  Clean up any spills right away.  Avoid walking on wet floors.  Keep items that you use a lot in easy-to-reach places.  If you need to reach something above you, use a strong step stool that has a grab bar.  Keep electrical cords out of the way.  Do not use floor polish or wax that makes floors slippery. If you must use wax, use non-skid floor wax.  Do not have throw rugs and other things on the floor that can make you trip. What can I do with my stairs?  Do not leave any items on the stairs.  Make sure that there are handrails on both sides of the stairs and use them. Fix handrails that are broken or loose. Make sure that handrails are as long as the stairways.  Check any carpeting to make sure that it is firmly attached to the stairs. Fix any carpet that is loose or worn.  Avoid having throw rugs at the top or bottom of the stairs. If you do have throw rugs, attach them to the floor with carpet tape.  Make sure that you have a light switch at the top of the stairs and the bottom of the stairs. If you do not have them, ask someone to add them for you. What else can I do to help prevent falls?  Wear shoes  that:  Do not have high heels.  Have rubber bottoms.  Are comfortable and fit you well.  Are closed at the toe. Do not wear sandals.  If you use a stepladder:  Make sure that it is fully opened. Do not climb a closed stepladder.  Make sure that both sides of the stepladder are locked into place.  Ask someone to hold it for you, if possible.  Clearly mark and make sure that you can see:  Any grab bars or handrails.  First and last steps.  Where the edge of each step is.  Use tools that help you move around (mobility aids) if they are needed. These include:  Canes.  Walkers.  Scooters.  Crutches.  Turn on the lights when you go into a dark area. Replace any light bulbs as soon as they burn out.  Set up your furniture so you have a clear path. Avoid moving your furniture around.  If any of your floors are uneven, fix them.  If there are any pets around you, be aware of where they are.  Review your medicines with your doctor. Some medicines can make you feel dizzy. This can increase your chance of falling. Ask your doctor what other things that you can do to help prevent falls. This information is not intended to replace advice given to you by your health care provider. Make sure you discuss any questions you have with your health care provider. Document Released: 09/16/2009 Document Revised: 04/27/2016 Document Reviewed: 12/25/2014 Elsevier Interactive Patient Education  2017 [employer].

## 2019-11-20 ENCOUNTER — Other Ambulatory Visit: Payer: Self-pay

## 2019-11-20 MED ORDER — METOPROLOL TARTRATE 25 MG PO TABS: 25.0000 mg | ORAL_TABLET | Freq: Two times a day (BID) | ORAL | 0 refills | Status: DC

## 2019-11-23 MED ORDER — ALLOPURINOL 300 MG PO TABS: 300.0000 mg | ORAL_TABLET | Freq: Every day | ORAL | 0 refills | Status: DC

## 2019-12-02 ENCOUNTER — Other Ambulatory Visit: Payer: Self-pay

## 2019-12-02 ENCOUNTER — Ambulatory Visit (INDEPENDENT_AMBULATORY_CARE_PROVIDER_SITE_OTHER): Payer: Medicare Other | Admitting: Cardiology

## 2019-12-02 ENCOUNTER — Encounter: Payer: Self-pay | Admitting: Cardiology

## 2019-12-02 VITALS — BP 112/78 | HR 79 | Ht 71.0 in | Wt 361.1 lb

## 2019-12-02 DIAGNOSIS — Z6841 Body Mass Index (BMI) 40.0 and over, adult: Secondary | ICD-10-CM

## 2019-12-02 DIAGNOSIS — I4819 Other persistent atrial fibrillation: Secondary | ICD-10-CM | POA: Diagnosis not present

## 2019-12-02 DIAGNOSIS — G4730 Sleep apnea, unspecified: Secondary | ICD-10-CM | POA: Diagnosis not present

## 2019-12-02 MED ORDER — APIXABAN 5 MG PO TABS: 5.0000 mg | ORAL_TABLET | Freq: Two times a day (BID) | ORAL | 2 refills | Status: DC

## 2019-12-02 NOTE — Patient Instructions (Signed)
Medication Instructions:  Your physician recommends that you continue on your current medications as directed. Please refer to the Current Medication list given to you today.  *If you need a refill on your cardiac medications before your next appointment, please call your pharmacy*  Lab Work: none If you have labs (blood work) drawn today and your tests are completely normal, you will receive your results only by: Marland Kitchen MyChart Message (if you have MyChart) OR . A paper copy in the mail If you have any lab test that is abnormal or we need to change your treatment, we will call you to review the results.  Testing/Procedures: none  Follow-Up: At [facility], you and your health needs are our priority.  As part of our continuing mission to provide you with exceptional heart care, we have created designated Provider Care Teams.  These Care Teams include your primary Cardiologist (physician) and Advanced Practice Providers (APPs -  Physician Assistants and Nurse Practitioners) who all work together to provide you with the care you need, when you need it.  Your next appointment:   6 month(s)  The format for your next appointment:   In Person  Provider:    You may see Kate Sable, MD or one of the following Advanced Practice Providers on your designated Care Team:    Murray Hodgkins, NP  Christell Faith, PA-C  Marrianne Mood, PA-C

## 2019-12-02 NOTE — Progress Notes (Signed)
Cardiology Office Note:    Date:  12/02/2019   ID:  Charles Floyd, DOB [birth year], MRN [id number]  PCP:  Margo Common, PA  Cardiologist:  Kate Sable, MD  Electrophysiologist:  None   Referring MD: Margo Common, PA   Chief Complaint  Patient presents with  . other    4 week f/u swelling in ankles and feet.Meds verbally reviewed w/ pt.    History of Present Illness:    Charles Floyd is a 68 y.o. male with a hx of OSA on CPAP, atrial fibrillation, obesity who presents for a 4 week follow-up.  He was originally seen as a follow-up from the ED due to atrial fibrillation.    On initial visit, patient was having symptoms of congestion and cough.  He was on antibiotics and steroids.  His apple watch told him his heart rhythm was irregular and he was was in atrial fibrillation. He then called his PCP who advised him to go to the emergency room.  In the emergency room patient was noted to be in atrial fibrillation with controlled ventricular response.  He was placed on Lopressor 25 mg twice daily and Eliquis 5 mg twice daily.   He was not compliant with his CPAP and was advised to have his mask refitted.  Echocardiogram showed normal systolic function and normal LV size.    Past Medical History:  Diagnosis Date  . Arthritis    BACK  . Asthma    AS A CHILD-NO INHALERS AS AN ADULT  . Atrial fibrillation ([place]) 08/2019  . Cancer of prostate [facility]) 2012   Robotic Assisted prostatectomy 2012 [facility]  . Gout   . Gout   . History of chicken pox   . History of kidney stones    H/O  . History of measles   . History of mumps   . Panic attacks   . Sleep apnea    using CPAP occasionally     Past Surgical History:  Procedure Laterality Date  . CHOLECYSTECTOMY N/A 09/18/2017   Procedure: LAPAROSCOPIC CHOLECYSTECTOMY WITH INTRAOPERATIVE CHOLANGIOGRAM;  Surgeon: Robert Bellow, MD;  Location: [facility] ORS;  Service: General;  Laterality: N/A;  . COLONOSCOPY  Jan  2003, 12-17-13   Dr Bary Castilla  . kidney stone extraction  07/2013   stent placed  . PROSTATE SURGERY  2012  . PROSTATECTOMY  05/16/2011   [facility]. Abdominal Laparoscopic, robotic assisted    Current Medications: Current Meds  Medication Sig  . acetaminophen (TYLENOL) 650 MG CR tablet Take 1,300 mg by mouth every 8 (eight) hours as needed for pain.  Marland Kitchen allopurinol (ZYLOPRIM) 300 MG tablet Take 1 tablet (300 mg total) by mouth daily. As needed for gout  . apixaban (ELIQUIS) 5 MG TABS tablet Take 1 tablet (5 mg total) by mouth 2 (two) times daily.  . metoprolol tartrate (LOPRESSOR) 25 MG tablet Take 1 tablet (25 mg total) by mouth 2 (two) times daily.  . [DISCONTINUED] apixaban (ELIQUIS) 5 MG TABS tablet Take 1 tablet (5 mg total) by mouth 2 (two) times daily.     Allergies:   Patient has no known allergies.   Social History   Socioeconomic History  . Marital status: Married    Spouse name: Not on file  . Number of children: 2  . Years of education: Not on file  . Highest education level: Master's degree (e.g., MA, MS, MEng, MEd, MSW, MBA)  Occupational History  . Occupation: Archivist  Tobacco Use  .  Smoking status: Never Smoker  . Smokeless tobacco: Never Used  Substance and Sexual Activity  . Alcohol use: Yes    Alcohol/week: 0.0 standard drinks    Comment: occasional use  . Drug use: No  . Sexual activity: Not on file  Other Topics Concern  . Not on file  Social History Narrative  . Not on file   Social Determinants of Health   Financial Resource Strain:   . Difficulty of Paying Living Expenses: Not on file  Food Insecurity:   . Worried About Charity fundraiser in the Last Year: Not on file  . Ran Out of Food in the Last Year: Not on file  Transportation Needs:   . Lack of Transportation (Medical): Not on file  . Lack of Transportation (Non-Medical): Not on file  Physical Activity:   . Days of Exercise per Week: Not on file  . Minutes of Exercise per Session: Not  on file  Stress:   . Feeling of Stress : Not on file  Social Connections:   . Frequency of Communication with Friends and Family: Not on file  . Frequency of Social Gatherings with Friends and Family: Not on file  . Attends Religious Services: Not on file  . Active Member of Clubs or Organizations: Not on file  . Attends Archivist Meetings: Not on file  . Marital Status: Not on file     Family History: The patient's family history includes Anemia in his mother; CAD in his father and mother; Cancer in his mother; Colon cancer in his mother; Congestive Heart Failure in his father; Emphysema in his father; Tuberculosis in his father.  ROS:   Please see the history of present illness.     All other systems reviewed and are negative.  EKGs/Labs/Other Studies Reviewed:    The following studies were reviewed today: TTE 2019-09-29 1. Left ventricular ejection fraction, by visual estimation, is 55 to 60%. The left ventricle has normal function. Normal left ventricular size. There is borderline left ventricular hypertrophy.  2. Left ventricular diastolic function could not be evaluated pattern of LV diastolic filling.  3. Global right ventricle has normal systolic function.The right ventricular size is normal. Right vetricular wall thickness was not assessed.  4. Left atrial size was normal.  5. Right atrial size was normal.  6. The mitral valve is normal in structure. Trace mitral valve regurgitation.  7. The tricuspid valve is normal in structure. Tricuspid valve regurgitation is mild.  8. The aortic valve is normal in structure. Aortic valve regurgitation was not visualized by color flow Doppler.  9. The pulmonic valve was not well visualized. Pulmonic valve regurgitation is not visualized by color flow Doppler. 10. Mildly elevated pulmonary artery systolic pressure.  EKG:  EKG is  ordered today.  The ekg ordered today demonstrates atrial fibrillation, heart rate 79.  Recent  Labs: 09/02/2019: BUN 18; Creatinine, Ser 1.00; Hemoglobin 15.4; Platelets 246; Potassium 4.1; Sodium 140 11/03/2019: TSH 4.142  Recent Lipid Panel    Component Value Date/Time   CHOL 149 03/24/2016 0901   TRIG 56 03/24/2016 0901   HDL 38 (L) 03/24/2016 0901   CHOLHDL 3.9 03/24/2016 0901   LDLCALC 100 (H) 03/24/2016 0901    Physical Exam:    VS:  BP 112/78 (BP Location: Left Arm, Patient Position: Sitting, Cuff Size: Large)   Pulse 79   Ht 5\' 11"  (1.803 m)   Wt (!) 361 lb 1.9 oz (163.8 kg)   SpO2  97%   BMI 50.37 kg/m     Wt Readings from Last 3 Encounters:  12/02/19 (!) 361 lb 1.9 oz (163.8 kg)  11/03/19 (!) 361 lb 4 oz (163.9 kg)  09/09/19 (!) 357 lb 12 oz (162.3 kg)     GEN:  Well nourished, well developed in no acute distress, obese HEENT: Normal NECK: No JVD; No carotid bruits LYMPHATICS: No lymphadenopathy CARDIAC: Irregular irregular, distant heart sounds RESPIRATORY: Clear anteriorly, decreased at bases. ABDOMEN: Soft, non-tender, non-distended MUSCULOSKELETAL:  No edema; No deformity  SKIN: Warm and dry NEUROLOGIC:  Alert and oriented x 3 PSYCHIATRIC:  Normal affect   ASSESSMENT:   Echocardiogram shows normal ejection fraction with EF 55 to 60%.  Normal LA size.  Normal RA size. persistent atrial fibrillation, chadsvasc score of 1 (age).  Patient has obstructive sleep apnea, morbid obesity.  Functionally active.  No symptoms.  We discussed options including cardioversion.  But decided on treating patient medically for now.  He has no symptoms.  EF and LA sizes are within normal limits. 1. Persistent atrial fibrillation ([place])   2. Morbid obesity with BMI of 45.0-49.9, adult ([place])   3. Sleep apnea, unspecified type    PLAN:    In order of problems listed above:  1. Lopressor 25 mg twice daily. Eliquis 5 mg twice daily.   2. Weight loss advised.  . 3. Patient encouraged to keep her appointment with pulmonary medicine/sleep apnea specialist for appropriate  mask fitting and management of OSA.  Follow-up in 6 months   Medication Adjustments/Labs and Tests Ordered: Current medicines are reviewed at length with the patient today.  Concerns regarding medicines are outlined above.  Orders Placed This Encounter  Procedures  . EKG 12-Lead   Meds ordered this encounter  Medications  . apixaban (ELIQUIS) 5 MG TABS tablet    Sig: Take 1 tablet (5 mg total) by mouth 2 (two) times daily.    Dispense:  180 tablet    Refill:  2    Patient Instructions  Medication Instructions:  Your physician recommends that you continue on your current medications as directed. Please refer to the Current Medication list given to you today.  *If you need a refill on your cardiac medications before your next appointment, please call your pharmacy*  Lab Work: none If you have labs (blood work) drawn today and your tests are completely normal, you will receive your results only by: Marland Kitchen MyChart Message (if you have MyChart) OR . A paper copy in the mail If you have any lab test that is abnormal or we need to change your treatment, we will call you to review the results.  Testing/Procedures: none  Follow-Up: At [facility], you and your health needs are our priority.  As part of our continuing mission to provide you with exceptional heart care, we have created designated Provider Care Teams.  These Care Teams include your primary Cardiologist (physician) and Advanced Practice Providers (APPs -  Physician Assistants and Nurse Practitioners) who all work together to provide you with the care you need, when you need it.  Your next appointment:   6 month(s)  The format for your next appointment:   In Person  Provider:    You may see Kate Sable, MD or one of the following Advanced Practice Providers on your designated Care Team:    Murray Hodgkins, NP  Christell Faith, PA-C  Marrianne Mood, PA-C     Signed, Kate Sable, MD  12/02/2019 11:37  AM    Charles Floyd

## 2019-12-15 ENCOUNTER — Other Ambulatory Visit: Payer: Self-pay | Admitting: *Deleted

## 2019-12-15 MED ORDER — METOPROLOL TARTRATE 25 MG PO TABS: 25.0000 mg | ORAL_TABLET | Freq: Two times a day (BID) | ORAL | 6 refills | Status: DC

## 2019-12-16 ENCOUNTER — Other Ambulatory Visit: Payer: Self-pay | Admitting: Family Medicine

## 2019-12-16 NOTE — Telephone Encounter (Signed)
Requested medication (s) are due for refill today:   Yes  Requested medication (s) are on the active medication list:   Yes  Future visit scheduled:   No     Last refill:  11/23/2019  #30  0 refills.    Courtesy refill has already been given.    Requested Prescriptions  Pending Prescriptions Disp Refills   allopurinol (ZYLOPRIM) 300 MG tablet [Pharmacy Med Name: ALLOPURINOL 300 MG TABLET] 30 tablet 0    Sig: Take 1 tablet (300 mg total) by mouth daily. As needed for gout      There is no refill protocol information for this order

## 2019-12-23 ENCOUNTER — Ambulatory Visit (INDEPENDENT_AMBULATORY_CARE_PROVIDER_SITE_OTHER): Payer: Medicare Other | Admitting: Internal Medicine

## 2019-12-23 ENCOUNTER — Other Ambulatory Visit: Payer: Self-pay

## 2019-12-23 ENCOUNTER — Encounter: Payer: Self-pay | Admitting: Internal Medicine

## 2019-12-23 VITALS — BP 134/78 | HR 90 | Temp 97.7°F | Ht 71.5 in | Wt 359.4 lb

## 2019-12-23 DIAGNOSIS — G4719 Other hypersomnia: Secondary | ICD-10-CM

## 2019-12-23 DIAGNOSIS — G4733 Obstructive sleep apnea (adult) (pediatric): Secondary | ICD-10-CM | POA: Diagnosis not present

## 2019-12-23 NOTE — Progress Notes (Signed)
Name: Charles Floyd MRN: [id number] DOB: Aug 14, 1951     CONSULTATION DATE: 12/23/2019  REFERRING MD : Natale Milch CHIEF COMPLAINT: Excessive daytime sleepiness   HISTORY OF PRESENT ILLNESS: Patient is seen today for problems and issues with sleep related to excessive daytime sleepiness Patient  has been having sleep problems for many years Patient has been having excessive daytime sleepiness for a long time Patient has been having extreme fatigue and tiredness, lack of energy +  very Loud snoring every night + struggling breathe at night and gasps for air  Patient has been diagnosed with sleep apnea several years ago Patient states that his full facemask was making him anxious and he felt like suffocating He has not used his CPAP mask in 1 year however he tried to use his CPAP mask couple months ago due to the fact that he went into paroxysmal atrial fibrillation and was placed on oral anticoagulation by his cardiologist  Without his mask, she has loud snoring gasping for air and shortness of breath at night   Discussed sleep data and reviewed with patient.  Encouraged proper weight management.  Discussed driving precautions and its relationship with hypersomnolence.  Discussed operating dangerous equipment and its relationship with hypersomnolence.  Discussed sleep hygiene, and benefits of a fixed sleep waked time.  The importance of getting eight or more hours of sleep discussed with patient.  Discussed limiting the use of the computer and television before bedtime.  Decrease naps during the day, so night time sleep will become enhanced.  Limit caffeine, and sleep deprivation.  HTN, stroke, and heart failure are potential risk factors.     No evidence of acute heart failure at this time No evidence or signs of infection at this time No respiratory distress No fevers, chills, nausea, vomiting, diarrhea No evidence of lower extremity edema No evidence  hemoptysis   EPWORTH SLEEP SCORE 8  PAST MEDICAL HISTORY :   has a past medical history of Arthritis, Asthma, Atrial fibrillation ([place]) (08/2019), Cancer of prostate ([place]) (2012), Gout, Gout, History of chicken pox, History of kidney stones, History of measles, History of mumps, Panic attacks, and Sleep apnea.  has a past surgical history that includes Colonoscopy (Jan 2003, 12-17-13); kidney stone extraction (07/2013); Prostatectomy (05/16/2011); Prostate surgery (2012); and Cholecystectomy (N/A, 09/18/2017). Prior to Admission medications   Medication Sig Start Date End Date Taking? Authorizing Provider  acetaminophen (TYLENOL) 650 MG CR tablet Take 1,300 mg by mouth every 8 (eight) hours as needed for pain.    [provider]  allopurinol (ZYLOPRIM) 300 MG tablet TAKE 1 TABLET (300 MG TOTAL) BY MOUTH DAILY. AS NEEDED FOR GOUT 12/16/19   Chrismon, Vickki Muff, PA  apixaban (ELIQUIS) 5 MG TABS tablet Take 1 tablet (5 mg total) by mouth 2 (two) times daily. 12/02/19   Kate Sable, MD  metoprolol tartrate (LOPRESSOR) 25 MG tablet Take 1 tablet (25 mg total) by mouth 2 (two) times daily. 12/15/19 12/14/20  Kate Sable, MD   No Known Allergies  FAMILY HISTORY:  family history includes Anemia in his mother; CAD in his father and mother; Cancer in his mother; Colon cancer in his mother; Congestive Heart Failure in his father; Emphysema in his father; Tuberculosis in his father. SOCIAL HISTORY:  reports that he has never smoked. He has never used smokeless tobacco. He reports current alcohol use. He reports that he does not use drugs.  REVIEW OF SYSTEMS:   Constitutional: Negative for fever, chills, weight loss, +malaise/fatigue  and diaphoresis.  HENT: Negative for hearing loss, ear pain, nosebleeds, congestion, sore throat, neck pain, tinnitus and ear discharge.   Eyes: Negative for blurred vision, double vision, photophobia, pain, discharge and redness.  Respiratory: Negative  for cough, hemoptysis, sputum production, shortness of breath, wheezing and stridor.   Cardiovascular: Negative for chest pain, palpitations, orthopnea, claudication, leg swelling and PND.  Gastrointestinal: Negative for heartburn, nausea, vomiting, abdominal pain, diarrhea, constipation, blood in stool and melena.  Genitourinary: Negative for dysuria, urgency, frequency, hematuria and flank pain.  Musculoskeletal: Negative for myalgias, back pain, joint pain and falls.  Skin: Negative for itching and rash.  Neurological: Negative for dizziness, tingling, tremors, sensory change, speech change, focal weakness, seizures, loss of consciousness, weakness and headaches.  Endo/Heme/Allergies: Negative for environmental allergies and polydipsia. Does not bruise/bleed easily.  ALL OTHER ROS ARE NEGATIVE  BP 134/78 (BP Location: Left Arm, Patient Position: Sitting, Cuff Size: Large)   Pulse 90   Temp 97.7 F (36.5 C) (Temporal)   Ht 5' 11.5" (1.816 m)   Wt (!) 359 lb 6.4 oz (163 kg)   SpO2 95% Comment: on ra  BMI 49.43 kg/m    Physical Examination:   GENERAL:NAD, no fevers, chills, no weakness no fatigue HEAD: Normocephalic, atraumatic.  EYES: Pupils equal, round, reactive to light. Extraocular muscles intact. No scleral icterus.  MOUTH: Moist mucosal membrane.   EAR, NOSE, THROAT: Clear without exudates. No external lesions.  NECK: Supple. No thyromegaly. No nodules. No JVD.  PULMONARY:CTA B/L no wheezes, no crackles, no rhonchi CARDIOVASCULAR: S1 and S2. Regular rate and rhythm. No murmurs, rubs, or gallops. No edema.  GASTROINTESTINAL: Soft, nontender, nondistended. No masses. Positive bowel sounds.  MUSCULOSKELETAL: No swelling, clubbing, or edema. Range of motion full in all extremities.  NEUROLOGIC: Cranial nerves II through XII are intact. No gross focal neurological deficits.  SKIN: No ulceration, lesions, rashes, or cyanosis. Skin warm and dry. Turgor intact.  PSYCHIATRIC: Mood,  affect within normal limits. The patient is awake, alert and oriented x 3. Insight, judgment intact.        ASSESSMENT AND PLAN SYNOPSIS  Patient with signs and symptoms of obstructive sleep apnea and a previous diagnosis of obstructive sleep apnea however was not able to wear his CPAP therapy due to terrible experience with the mask  Patient needs split-night sleep study to reconfirm his diagnosis and will need referral for mask fitting services for his CPAP he may benefit from nasal pillows or nasal mask for further care and management  Obesity -recommend significant weight loss -recommend changing diet  Deconditioned state -Recommend increased daily activity and exercise    COVID-19 EDUCATION: The signs and symptoms of COVID-19 were discussed with the patient and how to seek care for testing.  The importance of social distancing was discussed today. Hand Washing Techniques and avoid touching face was advised.    MEDICATION ADJUSTMENTS/LABS AND TESTS ORDERED: Sleep study needed  CURRENT MEDICATIONS REVIEWED AT LENGTH WITH PATIENT TODAY   Patient satisfied with Plan of action and management. All questions answered  Follow up in 3 months   Michella Detjen Patricia Pesa, M.D.  [facility]  Medical Director [place] Director [facility] Cardio-Pulmonary Department

## 2019-12-23 NOTE — Patient Instructions (Addendum)
Obtain sleep study for further assessment Recommend weight loss  AFTER SLEEP STUDY OBTAINED< PATIENT NEEDS REFERAL FOR MASK FITTING

## 2020-01-08 ENCOUNTER — Other Ambulatory Visit: Payer: Self-pay | Admitting: Orthopedic Surgery

## 2020-01-08 DIAGNOSIS — M5136 Other intervertebral disc degeneration, lumbar region: Secondary | ICD-10-CM

## 2020-01-08 DIAGNOSIS — G8929 Other chronic pain: Secondary | ICD-10-CM

## 2020-01-08 DIAGNOSIS — M545 Low back pain, unspecified: Secondary | ICD-10-CM

## 2020-01-20 ENCOUNTER — Ambulatory Visit
Admission: RE | Admit: 2020-01-20 | Discharge: 2020-01-20 | Disposition: A | Discharge status: Home / Self Care | Payer: Medicare Other | Source: Ambulatory Visit | Attending: Orthopedic Surgery | Admitting: Orthopedic Surgery

## 2020-01-20 ENCOUNTER — Other Ambulatory Visit: Payer: Self-pay

## 2020-01-20 DIAGNOSIS — M545 Low back pain, unspecified: Secondary | ICD-10-CM

## 2020-01-20 DIAGNOSIS — M51369 Other intervertebral disc degeneration, lumbar region without mention of lumbar back pain or lower extremity pain: Secondary | ICD-10-CM

## 2020-01-20 DIAGNOSIS — M5442 Lumbago with sciatica, left side: Secondary | ICD-10-CM | POA: Insufficient documentation

## 2020-01-20 DIAGNOSIS — M5136 Other intervertebral disc degeneration, lumbar region: Secondary | ICD-10-CM | POA: Diagnosis present

## 2020-01-20 DIAGNOSIS — G8929 Other chronic pain: Secondary | ICD-10-CM | POA: Diagnosis present

## 2020-01-29 ENCOUNTER — Institutional Professional Consult (permissible substitution): Payer: Medicare Other | Admitting: Pulmonary Disease

## 2020-02-03 ENCOUNTER — Encounter: Payer: Self-pay | Admitting: Family Medicine

## 2020-02-03 ENCOUNTER — Other Ambulatory Visit: Payer: Self-pay | Admitting: Family Medicine

## 2020-02-04 ENCOUNTER — Other Ambulatory Visit: Payer: Self-pay

## 2020-02-04 MED ORDER — ALLOPURINOL 300 MG PO TABS: 300.0000 mg | ORAL_TABLET | Freq: Every day | ORAL | 0 refills | Status: DC

## 2020-03-19 ENCOUNTER — Encounter: Payer: Self-pay | Admitting: Family Medicine

## 2020-03-19 ENCOUNTER — Telehealth: Payer: Self-pay | Admitting: Family Medicine

## 2020-03-19 NOTE — Telephone Encounter (Signed)
Attempted to contact patient to schedule appointment. Patient needs appointment for refill of medication Left VM to return call to office .

## 2020-03-19 NOTE — Telephone Encounter (Signed)
Requested medication (s) are due for refill today: yes  Requested medication (s) are on the active medication list: yes  Last refill:  02/04/20  Future visit scheduled: No  Notes to clinic:  Attempted to contact patient to schedule appointment. Left VM to return call to office. Last refill note states need appointment before additional refills    Requested Prescriptions  Pending Prescriptions Disp Refills   allopurinol (ZYLOPRIM) 300 MG tablet [Pharmacy Med Name: ALLOPURINOL 300 MG TABLET] 30 tablet 0    Sig: Take 1 tablet (300 mg total) by mouth daily. As needed for gout      Endocrinology:  Gout Agents Failed - 03/19/2020  4:34 PM      Failed - Uric Acid in normal range and within 360 days    Uric Acid  Date Value Ref Range Status  03/24/2016 7.8 3.7 - 8.6 mg/dL Final    Comment:               Therapeutic target for gout patients: <6.0          Failed - Valid encounter within last 12 months    Recent Outpatient Visits           6 months ago URI with cough and congestion   [place], [place]   1 year ago Sinusitis, unspecified chronicity, unspecified location   [place], [place]   1 year ago Chest pain, unspecified type   [place], [place]   2 years ago Viral upper respiratory tract infection   [place], [place], [place]   2 years ago Nausea   [place], [place], [place]              Passed - Cr in normal range and within 360 days    Creat  Date Value Ref Range Status  08/23/2017 1.16 0.70 - 1.25 mg/dL Final    Comment:    For patients >71 years of age, the reference limit for Creatinine is approximately 13% higher for people identified as African-American. .    Creatinine, Ser  Date Value Ref Range Status  09/02/2019 1.00 0.61 - 1.24 mg/dL Final

## 2020-03-19 NOTE — Telephone Encounter (Signed)
This encounter was created in error - please disregard.

## 2020-03-24 ENCOUNTER — Other Ambulatory Visit: Payer: Self-pay | Admitting: Family Medicine

## 2020-03-29 DIAGNOSIS — G8929 Other chronic pain: Secondary | ICD-10-CM | POA: Insufficient documentation

## 2020-03-29 DIAGNOSIS — M48061 Spinal stenosis, lumbar region without neurogenic claudication: Secondary | ICD-10-CM | POA: Insufficient documentation

## 2020-07-01 ENCOUNTER — Other Ambulatory Visit: Payer: Self-pay

## 2020-07-01 ENCOUNTER — Encounter: Payer: Self-pay | Admitting: Cardiology

## 2020-07-01 ENCOUNTER — Ambulatory Visit (INDEPENDENT_AMBULATORY_CARE_PROVIDER_SITE_OTHER): Payer: Medicare Other | Admitting: Cardiology

## 2020-07-01 VITALS — BP 104/76 | Ht 71.0 in | Wt 355.0 lb

## 2020-07-01 DIAGNOSIS — Z6841 Body Mass Index (BMI) 40.0 and over, adult: Secondary | ICD-10-CM

## 2020-07-01 DIAGNOSIS — G4730 Sleep apnea, unspecified: Secondary | ICD-10-CM | POA: Diagnosis not present

## 2020-07-01 DIAGNOSIS — I4819 Other persistent atrial fibrillation: Secondary | ICD-10-CM | POA: Diagnosis not present

## 2020-07-01 NOTE — Patient Instructions (Signed)
Medication Instructions:  - Your physician recommends that you continue on your current medications as directed. Please refer to the Current Medication list given to you today.  *If you need a refill on your cardiac medications before your next appointment, please call your pharmacy*   Lab Work: - none ordered  If you have labs (blood work) drawn today and your tests are completely normal, you will receive your results only by: Marland Kitchen MyChart Message (if you have MyChart) OR . A paper copy in the mail If you have any lab test that is abnormal or we need to change your treatment, we will call you to review the results.   Testing/Procedures: - none ordered   Follow-Up: At [facility], you and your health needs are our priority.  As part of our continuing mission to provide you with exceptional heart care, we have created designated Provider Care Teams.  These Care Teams include your primary Cardiologist (physician) and Advanced Practice Providers (APPs -  Physician Assistants and Nurse Practitioners) who all work together to provide you with the care you need, when you need it.  We recommend signing up for the patient portal called "MyChart".  Sign up information is provided on this After Visit Summary.  MyChart is used to connect with patients for Virtual Visits (Telemedicine).  Patients are able to view lab/test results, encounter notes, upcoming appointments, etc.  Non-urgent messages can be sent to your provider as well.   To learn more about what you can do with MyChart, go to [email or web address].    Your next appointment:   6 month(s)  The format for your next appointment:   In Person  Provider:    You may see Kate Sable, MD or one of the following Advanced Practice Providers on your designated Care Team:    Murray Hodgkins, NP  Christell Faith, PA-C  Marrianne Mood, PA-C    Other Instructions

## 2020-07-01 NOTE — Progress Notes (Signed)
Cardiology Office Note:    Date:  07/01/2020   ID:  [name], DOB 1951-02-08, MRN [id number]  PCP:  Margo Common, PA  Cardiologist:  Kate Sable, MD  Electrophysiologist:  None   Referring MD: Margo Common, PA   Chief Complaint  Patient presents with  . Other    6 month follow up. patient c/o SOB with walking. Meds reviewed verbally with patient.     History of Present Illness:    Charles Floyd is a 69 y.o. male with a hx of OSA on CPAP, atrial fibrillation, obesity who presents for a 7-month follow-up.  Patient is being seen due to persistent atrial fibrillation.  He states having shortness of breath with exertion.  He takes his medications as prescribed including Lopressor and Eliquis for A. fib. Denies palpitations, states the beta-blocker is working well. Last echocardiogram showed normal systolic and diastolic function, EF 55 to 60%.  Has a history of noncompliance with CPAP. He was referred to see pulmonary medicine after last visit. He saw pulmonary medicine and repeat sleep eval was recommended but patient did not follow-up. He currently sleeps upright in his chair and states he sleeps better. He has no concerns at this time and states he will do his best to follow-up with pulmonary medicine.    Past Medical History:  Diagnosis Date  . Arthritis    BACK  . Asthma    AS A CHILD-NO INHALERS AS AN ADULT  . Atrial fibrillation ([place]) 08/2019  . Cancer of prostate [facility]) 2012   Robotic Assisted prostatectomy 2012 [facility]  . Gout   . Gout   . History of chicken pox   . History of kidney stones    H/O  . History of measles   . History of mumps   . Panic attacks   . Sleep apnea    using CPAP occasionally     Past Surgical History:  Procedure Laterality Date  . CHOLECYSTECTOMY N/A 09/18/2017   Procedure: LAPAROSCOPIC CHOLECYSTECTOMY WITH INTRAOPERATIVE CHOLANGIOGRAM;  Surgeon: Robert Bellow, MD;  Location: [facility] ORS;  Service: General;   Laterality: N/A;  . COLONOSCOPY  Jan 2003, 12-17-13   Dr Bary Castilla  . kidney stone extraction  07/2013   stent placed  . PROSTATE SURGERY  2012  . PROSTATECTOMY  05/16/2011   [facility]. Abdominal Laparoscopic, robotic assisted    Current Medications: Current Meds  Medication Sig  . acetaminophen (TYLENOL) 650 MG CR tablet Take 1,300 mg by mouth every 8 (eight) hours as needed for pain.  Marland Kitchen allopurinol (ZYLOPRIM) 300 MG tablet TAKE 1 TABLET (300 MG TOTAL) BY MOUTH DAILY. AS NEEDED FOR GOUT  . apixaban (ELIQUIS) 5 MG TABS tablet Take 1 tablet (5 mg total) by mouth 2 (two) times daily.  . metoprolol tartrate (LOPRESSOR) 25 MG tablet Take 1 tablet (25 mg total) by mouth 2 (two) times daily.     Allergies:   Patient has no known allergies.   Social History   Socioeconomic History  . Marital status: Married    Spouse name: Not on file  . Number of children: 2  . Years of education: Not on file  . Highest education level: Master's degree (e.g., MA, MS, MEng, MEd, MSW, MBA)  Occupational History  . Occupation: Archivist  Tobacco Use  . Smoking status: Never Smoker  . Smokeless tobacco: Never Used  Vaping Use  . Vaping Use: Never used  Substance and Sexual Activity  . Alcohol use:  Yes    Alcohol/week: 0.0 standard drinks    Comment: occasional use  . Drug use: No  . Sexual activity: Not on file  Other Topics Concern  . Not on file  Social History Narrative  . Not on file   Social Determinants of Health   Financial Resource Strain:   . Difficulty of Paying Living Expenses:   Food Insecurity:   . Worried About Charity fundraiser in the Last Year:   . Arboriculturist in the Last Year:   Transportation Needs:   . Film/video editor (Medical):   Marland Kitchen Lack of Transportation (Non-Medical):   Physical Activity:   . Days of Exercise per Week:   . Minutes of Exercise per Session:   Stress:   . Feeling of Stress :   Social Connections:   . Frequency of Communication with Friends  and Family:   . Frequency of Social Gatherings with Friends and Family:   . Attends Religious Services:   . Active Member of Clubs or Organizations:   . Attends Archivist Meetings:   Marland Kitchen Marital Status:      Family History: The patient's family history includes Anemia in his mother; CAD in his father and mother; Cancer in his mother; Colon cancer in his mother; Congestive Heart Failure in his father; Emphysema in his father; Tuberculosis in his father.  ROS:   Please see the history of present illness.     All other systems reviewed and are negative.  EKGs/Labs/Other Studies Reviewed:    The following studies were reviewed today: TTE 10/01/2019 1. Left ventricular ejection fraction, by visual estimation, is 55 to 60%. The left ventricle has normal function. Normal left ventricular size. There is borderline left ventricular hypertrophy.  2. Left ventricular diastolic function could not be evaluated pattern of LV diastolic filling.  3. Global right ventricle has normal systolic function.The right ventricular size is normal. Right vetricular wall thickness was not assessed.  4. Left atrial size was normal.  5. Right atrial size was normal.  6. The mitral valve is normal in structure. Trace mitral valve regurgitation.  7. The tricuspid valve is normal in structure. Tricuspid valve regurgitation is mild.  8. The aortic valve is normal in structure. Aortic valve regurgitation was not visualized by color flow Doppler.  9. The pulmonic valve was not well visualized. Pulmonic valve regurgitation is not visualized by color flow Doppler. 10. Mildly elevated pulmonary artery systolic pressure.  EKG:  EKG is  ordered today.  The ekg ordered today demonstrates atrial fibrillation, heart rate 88.  Recent Labs: 09/02/2019: BUN 18; Creatinine, Ser 1.00; Hemoglobin 15.4; Platelets 246; Potassium 4.1; Sodium 140 11/03/2019: TSH 4.142  Recent Lipid Panel    Component Value Date/Time   CHOL  149 03/24/2016 0901   TRIG 56 03/24/2016 0901   HDL 38 (L) 03/24/2016 0901   CHOLHDL 3.9 03/24/2016 0901   LDLCALC 100 (H) 03/24/2016 0901    Physical Exam:    VS:  BP 104/76 (BP Location: Right Arm, Patient Position: Sitting, Cuff Size: Large)   Ht 5\' 11"  (1.803 m)   Wt (!) 355 lb (161 kg)   BMI 49.51 kg/m     Wt Readings from Last 3 Encounters:  07/01/20 (!) 355 lb (161 kg)  12/23/19 (!) 359 lb 6.4 oz (163 kg)  12/02/19 (!) 361 lb 1.9 oz (163.8 kg)     GEN:  Well nourished, well developed in no acute distress, obese HEENT:  Normal NECK: No JVD; No carotid bruits LYMPHATICS: No lymphadenopathy CARDIAC: Irregular irregular, distant heart sounds RESPIRATORY: Clear anteriorly, decreased at bases. ABDOMEN: Soft, non-tender, non-distended MUSCULOSKELETAL:  No edema; No deformity  SKIN: Warm and dry NEUROLOGIC:  Alert and oriented x 3 PSYCHIATRIC:  Normal affect   ASSESSMENT:    1. Persistent atrial fibrillation ([place])   2. Morbid obesity with BMI of 45.0-49.9, adult ([place])   3. Sleep apnea, unspecified type      PLAN:    In order of problems listed above:  1. Patient with history of persistent atrial fibrillation, last echocardiogram with normal ejection fraction, EF 55 to 60%.  CHA2DS2-VASc score of 1 .  Continue Lopressor 25 mg twice daily. Eliquis 5 mg twice daily.   2. Patient is morbidly obese, weight loss, low calorie diet advised 3. Patient with history of sleep apnea, long discussion had with patient regarding need for pulmonary eval and management of OSA as indicated with CPAP. This will help with patient's breathing and also A. fib. He endorses understanding and states he will follow up with pulmonary medicine.  Follow-up in 6 months  Total encounter time 35 minutes  Greater than 50% was spent in counseling and coordination of care with the patient  Medication Adjustments/Labs and Tests Ordered: Current medicines are reviewed at length with the patient today.   Concerns regarding medicines are outlined above.  Orders Placed This Encounter  Procedures  . EKG 12-Lead   No orders of the defined types were placed in this encounter.   Patient Instructions  Medication Instructions:  - Your physician recommends that you continue on your current medications as directed. Please refer to the Current Medication list given to you today.  *If you need a refill on your cardiac medications before your next appointment, please call your pharmacy*   Lab Work: - none ordered  If you have labs (blood work) drawn today and your tests are completely normal, you will receive your results only by: Marland Kitchen MyChart Message (if you have MyChart) OR . A paper copy in the mail If you have any lab test that is abnormal or we need to change your treatment, we will call you to review the results.   Testing/Procedures: - none ordered   Follow-Up: At [facility], you and your health needs are our priority.  As part of our continuing mission to provide you with exceptional heart care, we have created designated Provider Care Teams.  These Care Teams include your primary Cardiologist (physician) and Advanced Practice Providers (APPs -  Physician Assistants and Nurse Practitioners) who all work together to provide you with the care you need, when you need it.  We recommend signing up for the patient portal called "MyChart".  Sign up information is provided on this After Visit Summary.  MyChart is used to connect with patients for Virtual Visits (Telemedicine).  Patients are able to view lab/test results, encounter notes, upcoming appointments, etc.  Non-urgent messages can be sent to your provider as well.   To learn more about what you can do with MyChart, go to [email or web address].    Your next appointment:   6 month(s)  The format for your next appointment:   In Person  Provider:    You may see Kate Sable, MD or one of the following Advanced Practice  Providers on your designated Care Team:    Murray Hodgkins, NP  Christell Faith, PA-C  Marrianne Mood, PA-C    Other Instructions  Signed, Kate Sable, MD  07/01/2020 12:36 PM    [place]

## 2020-09-09 ENCOUNTER — Other Ambulatory Visit: Payer: Self-pay | Admitting: *Deleted

## 2020-09-09 MED ORDER — METOPROLOL TARTRATE 25 MG PO TABS: 25.0000 mg | ORAL_TABLET | Freq: Two times a day (BID) | ORAL | 6 refills | Status: DC

## 2020-11-08 ENCOUNTER — Other Ambulatory Visit: Payer: Self-pay

## 2020-11-08 MED ORDER — APIXABAN 5 MG PO TABS: 5.0000 mg | ORAL_TABLET | Freq: Two times a day (BID) | ORAL | 1 refills | Status: DC

## 2020-11-22 NOTE — Progress Notes (Addendum)
Subjective:   Charles Floyd is a 69 y.o. male who presents for Medicare Annual/Subsequent preventive examination.  I connected with Charles Floyd today by telephone and verified that I am speaking with the correct person using two identifiers. Location patient: home Location provider: work Persons participating in the virtual visit: patient, provider.   I discussed the limitations, risks, security and privacy concerns of performing an evaluation and management service by telephone and the availability of in person appointments. I also discussed with the patient that there may be a patient responsible charge related to this service. The patient expressed understanding and verbally consented to this telephonic visit.    Interactive audio and video telecommunications were attempted between this provider and patient, however failed, due to patient having technical difficulties OR patient did not have access to video capability.  We continued and completed visit with audio only.   Review of Systems    N/A  Cardiac Risk Factors include: advanced age (>58men, >54 women);hypertension;male gender;obesity (BMI >30kg/m2)     Objective:    Today's Vitals   11/23/20 1013  PainSc: 6    There is no height or weight on file to calculate BMI.  Advanced Directives 11/23/2020 11/18/2019 09/02/2019 09/18/2017 09/17/2017  Does Patient Have a Medical Advance Directive? No No No No No  Would patient like information on creating a medical advance directive? No - Patient declined No - Patient declined No - Patient declined No - Patient declined -    Current Medications (verified) Outpatient Encounter Medications as of 11/23/2020  Medication Sig   acetaminophen (TYLENOL) 650 MG CR tablet Take 1,300 mg by mouth every 8 (eight) hours as needed for pain.   allopurinol (ZYLOPRIM) 300 MG tablet TAKE 1 TABLET (300 MG TOTAL) BY MOUTH DAILY. AS NEEDED FOR GOUT   apixaban (ELIQUIS) 5 MG TABS tablet  Take 1 tablet (5 mg total) by mouth 2 (two) times daily.   metoprolol tartrate (LOPRESSOR) 25 MG tablet Take 1 tablet (25 mg total) by mouth 2 (two) times daily.   gabapentin (NEURONTIN) 300 MG capsule Take by mouth. (Patient not taking: Reported on 11/23/2020)   No facility-administered encounter medications on file as of 11/23/2020.    Allergies (verified) Patient has no known allergies.   History: Past Medical History:  Diagnosis Date   Arthritis    BACK   Asthma    AS A CHILD-NO INHALERS AS AN ADULT   Atrial fibrillation (Charles Floyd) 08/2019   Cancer of prostate [facility]) 2012   Robotic Assisted prostatectomy 2012 [facility]   Gout    Gout    History of chicken pox    History of kidney stones    H/O   History of measles    History of mumps    Panic attacks    Sleep apnea    using CPAP occasionally    Past Surgical History:  Procedure Laterality Date   CHOLECYSTECTOMY N/A 09/18/2017   Procedure: LAPAROSCOPIC CHOLECYSTECTOMY WITH INTRAOPERATIVE CHOLANGIOGRAM;  Surgeon: Charles Bellow, MD;  Location: [facility] ORS;  Service: General;  Laterality: N/A;   COLONOSCOPY  Jan 2003, 12-17-13   Dr Charles Floyd   kidney stone extraction  07/2013   stent placed   PROSTATE SURGERY  2012   PROSTATECTOMY  05/16/2011   [facility] [place]. Abdominal Laparoscopic, robotic assisted   Family History  Problem Relation Age of Onset   Cancer Mother        colon and breast   CAD Mother  Anemia Mother    Colon cancer Mother    Congestive Heart Failure Father    Tuberculosis Father    CAD Father    Emphysema Father    Social History   Socioeconomic History   Marital status: Married    Spouse name: Not on file   Number of children: 2   Years of education: Not on file   Highest education level: Master's degree (e.g., MA, MS, MEng, MEd, MSW, MBA)  Occupational History   Occupation: Archivist  Tobacco Use   Smoking status: Never Smoker   Smokeless tobacco: Never Used  Scientific laboratory technician Use: Never used   Substance and Sexual Activity   Alcohol use: Yes    Alcohol/week: 0.0 standard drinks    Comment: occasional use 1 drink every couple weeks (wine or beer)   Drug use: No   Sexual activity: Not on file  Other Topics Concern   Not on file  Social History Narrative   Not on file   Social Determinants of Health   Financial Resource Strain: Low Risk    Difficulty of Paying Living Expenses: Not hard at all  Food Insecurity: No Food Insecurity   Worried About Charity fundraiser in the Last Year: Never true   [place] in the Last Year: Never true  Transportation Needs: No Transportation Needs   Lack of Transportation (Medical): No   Lack of Transportation (Non-Medical): No  Physical Activity: Inactive   Days of Exercise per Week: 0 days   Minutes of Exercise per Session: 0 min  Stress: Stress Concern Present   Feeling of Stress : Rather much  Social Connections: Moderately Integrated   Frequency of Communication with Friends and Family: More than three times a week   Frequency of Social Gatherings with Friends and Family: More than three times a week   Attends Religious Services: More than 4 times per year   Active Member of Charles Floyd or Organizations: No   Attends Archivist Meetings: Never   Marital Status: Married    Tobacco Counseling Counseling given: Not Answered   Clinical Intake:  Pre-visit preparation completed: Yes  Pain : 0-10 Pain Score: 6  Pain Type: Chronic pain (sciatic pain) Pain Location: Leg Pain Orientation: Right,Left Pain Descriptors / Indicators: Dull Pain Frequency: Constant Pain Relieving Factors: Takes Tylenol ES daily for pain.  Pain Relieving Factors: Takes Tylenol ES daily for pain.  Nutritional Risks: None Diabetes: No  How often do you need to have someone help you when you read instructions, pamphlets, or other written materials from your doctor or pharmacy?: 1 - Never  Diabetic? No  Interpreter Needed?:  No  Information entered by :: [facility], LPN   Activities of Daily Living In your present state of health, do you have any difficulty performing the following activities: 11/23/2020  Hearing? N  Vision? Y  Comment Needs a new eye glass prescription. Pt has an eye exam scheduled for 02/2021 with Dr George Ina. Pt does not drive @ night due to glare.  Difficulty concentrating or making decisions? N  Walking or climbing stairs? Y  Comment Due to leg pain and SOB.  Dressing or bathing? N  Doing errands, shopping? N  Preparing Food and eating ? N  Using the Toilet? N  In the past six months, have you accidently leaked urine? Y  Comment Due to previous prostate removal.  Do you have problems with loss of bowel control? N  Managing your  Medications? N  Managing your Finances? N  Housekeeping or managing your Housekeeping? N  Some recent data might be hidden    Patient Care Team: Chrismon, Vickki Muff, PA-C as PCP - General (Family Medicine) Kate Sable, MD as PCP - Cardiology (Cardiology) Charles Floyd, Forest Gleason, MD (General Surgery) Birder Robson, MD as Referring Physician (Ophthalmology) Reche Dixon, PA-C (Orthopedic Surgery) Doyle Askew, MD as Referring Physician (Physical Medicine and Rehabilitation) Flora Lipps, MD as Consulting Physician (Pulmonary Disease)  Indicate any recent Medical Services you may have received from other than [facility] providers in the past year (date may be approximate).     Assessment:   This is a routine wellness examination for [name].  Hearing/Vision screen No exam data present  Dietary issues and exercise activities discussed: Current Exercise Habits: The patient does not participate in regular exercise at present, Exercise limited by: orthopedic condition(s)  Goals      DIET - INCREASE WATER INTAKE     Recommend to drink at least 6-8 8oz glasses of water per day.     Prevent falls     Recommend to remove any items from the home  that may cause slips or trips.       Depression Screen PHQ 2/9 Scores 11/23/2020 11/18/2019 01/06/2019 08/23/2017 03/23/2016  PHQ - 2 Score 0 0 0 1 0    Fall Risk Fall Risk  11/23/2020 11/18/2019 01/06/2019 08/23/2017 03/23/2016  Falls in the past year? 1 0 0 No Yes  Number falls in past yr: 1 0 - - 2 or more  Injury with Fall? 0 0 - - No  Risk for fall due to : Impaired balance/gait;Impaired mobility;Impaired vision - - - -  Follow up Falls prevention discussed - - - -    FALL RISK PREVENTION PERTAINING TO THE HOME:  Any stairs in or around the home? No  If so, are there any without handrails? No  Home free of loose throw rugs in walkways, pet beds, electrical cords, etc? Yes  Adequate lighting in your home to reduce risk of falls? Yes   ASSISTIVE DEVICES UTILIZED TO PREVENT FALLS:  Life alert? Yes  Use of a cane, walker or w/c? Yes  Grab bars in the bathroom? Yes  Shower chair or bench in shower? No  Elevated toilet seat or a handicapped toilet? Yes    Cognitive Function:        Immunizations Immunization History  Administered Date(s) Administered   Influenza Split 10/14/2012   Influenza, High Dose Seasonal PF 09/19/2017   Influenza-Unspecified 09/22/2020   Pneumococcal Conjugate-13 03/23/2016   Pneumococcal Polysaccharide-23 09/19/2017   Tdap 03/23/2016   Zoster 10/14/2012    TDAP status: Up to date  Flu Vaccine status: Up to date  Pneumococcal vaccine status: Up to date  Covid-19 vaccine status: Completed vaccines  Qualifies for Shingles Vaccine? Yes   Zostavax completed Yes   Shingrix Completed?: No.    Education has been provided regarding the importance of this vaccine. Patient has been advised to call insurance company to determine out of pocket expense if they have not yet received this vaccine. Advised may also receive vaccine at local pharmacy or Health Dept. Verbalized acceptance and understanding.  Screening Tests Health Maintenance  Topic Date  Due   Hepatitis C Screening  Never done   COVID-19 Vaccine (1) Never done   COLONOSCOPY  12/17/2018   TETANUS/TDAP  03/23/2026   INFLUENZA VACCINE  Completed   PNA vac Low Risk Adult  Completed  Health Maintenance  Health Maintenance Due  Topic Date Due   Hepatitis C Screening  Never done   COVID-19 Vaccine (1) Never done   COLONOSCOPY  12/17/2018    Colorectal cancer screening: Referral to GI placed today. Pt aware the office will call re: appt.  Lung Cancer Screening: (Low Dose CT Chest recommended if Age 89-80 years, 30 pack-year currently smoking OR have quit w/in 15years.) does not qualify.   Additional Screening:  Hepatitis C Screening: does qualify and would like this added to next in office apt.   Vision Screening: Recommended annual ophthalmology exams for early detection of glaucoma and other disorders of the eye. Is the patient up to date with their annual eye exam?  Yes  Who is the provider or what is the name of the office in which the patient attends annual eye exams? Dr George Ina @ [place] If pt is not established with a provider, would they like to be referred to a provider to establish care? No .   Dental Screening: Recommended annual dental exams for proper oral hygiene  Community Resource Referral / Chronic Care Management: CRR required this visit?  No   CCM required this visit?  No      Plan:     I have personally reviewed and noted the following in the patient's chart:   Medical and social history Use of alcohol, tobacco or illicit drugs  Current medications and supplements Functional ability and status Nutritional status Physical activity Advanced directives List of other physicians Hospitalizations, surgeries, and ER visits in previous 12 months Vitals Screenings to include cognitive, depression, and falls Referrals and appointments  In addition, I have reviewed and discussed with patient certain preventive protocols, quality metrics, and  best practice recommendations. A written personalized care plan for preventive services as well as general preventive health recommendations were provided to patient.     Kalynne Womac [place], [place]   12/12/3233   Nurse Notes: Pt would like to have the Hep C lab added to next in office blood work orders. Pt has received both Covid vaccines. Requested that he bring vaccine card into next in office apt to update chart.   Reviewed note and plan of Nurse Health Advisor. Agree with documentation and recommendations.

## 2020-11-23 ENCOUNTER — Other Ambulatory Visit: Payer: Self-pay

## 2020-11-23 ENCOUNTER — Ambulatory Visit (INDEPENDENT_AMBULATORY_CARE_PROVIDER_SITE_OTHER): Payer: Medicare Other

## 2020-11-23 DIAGNOSIS — Z1211 Encounter for screening for malignant neoplasm of colon: Secondary | ICD-10-CM | POA: Diagnosis not present

## 2020-11-23 DIAGNOSIS — Z0000 Encounter for general adult medical examination without abnormal findings: Secondary | ICD-10-CM | POA: Diagnosis not present

## 2020-11-23 DIAGNOSIS — Z860100 Personal history of colon polyps, unspecified: Secondary | ICD-10-CM

## 2020-11-23 NOTE — Patient Instructions (Signed)
Charles Floyd , Thank you for taking time to come for your Medicare Wellness Visit. I appreciate your ongoing commitment to your health goals. Please review the following plan we discussed and let me know if I can assist you in the future.   Screening recommendations/referrals: Colonoscopy: Ordered today. Pt aware office will call UY:[id number].  Recommended yearly ophthalmology/optometry visit for glaucoma screening and checkup Recommended yearly dental visit for hygiene and checkup  Vaccinations: Influenza vaccine: Done 09/22/20 Pneumococcal vaccine: Completed series Tdap vaccine: Up to date, due 03/2026 Shingles vaccine: Shingrix discussed. Please contact your pharmacy for coverage information.     Advanced directives: Please bring a copy of your POA (Power of Attorney) and/or Living Will to your next appointment.   Conditions/risks identified: Fall risk preventatives. Recommend to increase water intake to 6-8 8 oz glasses a day.   Next appointment: 11/30/21 @ 9:00 AM for an AWV call. Declined scheduling a follow up with PCP at this time.   Preventive Care 69 Years and Older, Male Preventive care refers to lifestyle choices and visits with your health care provider that can promote health and wellness. What does preventive care include?  A yearly physical exam. This is also called an annual well check.  Dental exams once or twice a year.  Routine eye exams. Ask your health care provider how often you should have your eyes checked.  Personal lifestyle choices, including:  Daily care of your teeth and gums.  Regular physical activity.  Eating a healthy diet.  Avoiding tobacco and drug use.  Limiting alcohol use.  Practicing safe sex.  Taking low doses of aspirin every day.  Taking vitamin and mineral supplements as recommended by your health care provider. What happens during an annual well check? The services and screenings done by your health care provider during your annual  well check will depend on your age, overall health, lifestyle risk factors, and family history of disease. Counseling  Your health care provider may ask you questions about your:  Alcohol use.  Tobacco use.  Drug use.  Emotional well-being.  Home and relationship well-being.  Sexual activity.  Eating habits.  History of falls.  Memory and ability to understand (cognition).  Work and work Statistician. Screening  You may have the following tests or measurements:  Height, weight, and BMI.  Blood pressure.  Lipid and cholesterol levels. These may be checked every 5 years, or more frequently if you are over 80 years old.  Skin check.  Lung cancer screening. You may have this screening every year starting at age 69 if you have a 30-pack-year history of smoking and currently smoke or have quit within the past 15 years.  Fecal occult blood test (FOBT) of the stool. You may have this test every year starting at age 69.  Flexible sigmoidoscopy or colonoscopy. You may have a sigmoidoscopy every 5 years or a colonoscopy every 10 years starting at age 69.  Prostate cancer screening. Recommendations will vary depending on your family history and other risks.  Hepatitis C blood test.  Hepatitis B blood test.  Sexually transmitted disease (STD) testing.  Diabetes screening. This is done by checking your blood sugar (glucose) after you have not eaten for a while (fasting). You may have this done every 1-3 years.  Abdominal aortic aneurysm (AAA) screening. You may need this if you are a current or former smoker.  Osteoporosis. You may be screened starting at age 69 if you are at high risk. Talk with your  health care provider about your test results, treatment options, and if necessary, the need for more tests. Vaccines  Your health care provider may recommend certain vaccines, such as:  Influenza vaccine. This is recommended every year.  Tetanus, diphtheria, and acellular  pertussis (Tdap, Td) vaccine. You may need a Td booster every 10 years.  Zoster vaccine. You may need this after age 69.  Pneumococcal 13-valent conjugate (PCV13) vaccine. One dose is recommended after age 69.  Pneumococcal polysaccharide (PPSV23) vaccine. One dose is recommended after age 69. Talk to your health care provider about which screenings and vaccines you need and how often you need them. This information is not intended to replace advice given to you by your health care provider. Make sure you discuss any questions you have with your health care provider. Document Released: 12/17/2015 Document Revised: 08/09/2016 Document Reviewed: 09/21/2015 Elsevier Interactive Patient Education  2017 [employer] Prevention in the Home Falls can cause injuries. They can happen to people of all ages. There are many things you can do to make your home safe and to help prevent falls. What can I do on the outside of my home?  Regularly fix the edges of walkways and driveways and fix any cracks.  Remove anything that might make you trip as you walk through a door, such as a raised step or threshold.  Trim any bushes or trees on the path to your home.  Use bright outdoor lighting.  Clear any walking paths of anything that might make someone trip, such as rocks or tools.  Regularly check to see if handrails are loose or broken. Make sure that both sides of any steps have handrails.  Any raised decks and porches should have guardrails on the edges.  Have any leaves, snow, or ice cleared regularly.  Use sand or salt on walking paths during winter.  Clean up any spills in your garage right away. This includes oil or grease spills. What can I do in the bathroom?  Use night lights.  Install grab bars by the toilet and in the tub and shower. Do not use towel bars as grab bars.  Use non-skid mats or decals in the tub or shower.  If you need to sit down in the shower, use a plastic,  non-slip stool.  Keep the floor dry. Clean up any water that spills on the floor as soon as it happens.  Remove soap buildup in the tub or shower regularly.  Attach bath mats securely with double-sided non-slip rug tape.  Do not have throw rugs and other things on the floor that can make you trip. What can I do in the bedroom?  Use night lights.  Make sure that you have a light by your bed that is easy to reach.  Do not use any sheets or blankets that are too big for your bed. They should not hang down onto the floor.  Have a firm chair that has side arms. You can use this for support while you get dressed.  Do not have throw rugs and other things on the floor that can make you trip. What can I do in the kitchen?  Clean up any spills right away.  Avoid walking on wet floors.  Keep items that you use a lot in easy-to-reach places.  If you need to reach something above you, use a strong step stool that has a grab bar.  Keep electrical cords out of the way.  Do not use  floor polish or wax that makes floors slippery. If you must use wax, use non-skid floor wax.  Do not have throw rugs and other things on the floor that can make you trip. What can I do with my stairs?  Do not leave any items on the stairs.  Make sure that there are handrails on both sides of the stairs and use them. Fix handrails that are broken or loose. Make sure that handrails are as long as the stairways.  Check any carpeting to make sure that it is firmly attached to the stairs. Fix any carpet that is loose or worn.  Avoid having throw rugs at the top or bottom of the stairs. If you do have throw rugs, attach them to the floor with carpet tape.  Make sure that you have a light switch at the top of the stairs and the bottom of the stairs. If you do not have them, ask someone to add them for you. What else can I do to help prevent falls?  Wear shoes that:  Do not have high heels.  Have rubber  bottoms.  Are comfortable and fit you well.  Are closed at the toe. Do not wear sandals.  If you use a stepladder:  Make sure that it is fully opened. Do not climb a closed stepladder.  Make sure that both sides of the stepladder are locked into place.  Ask someone to hold it for you, if possible.  Clearly mark and make sure that you can see:  Any grab bars or handrails.  First and last steps.  Where the edge of each step is.  Use tools that help you move around (mobility aids) if they are needed. These include:  Canes.  Walkers.  Scooters.  Crutches.  Turn on the lights when you go into a dark area. Replace any light bulbs as soon as they burn out.  Set up your furniture so you have a clear path. Avoid moving your furniture around.  If any of your floors are uneven, fix them.  If there are any pets around you, be aware of where they are.  Review your medicines with your doctor. Some medicines can make you feel dizzy. This can increase your chance of falling. Ask your doctor what other things that you can do to help prevent falls. This information is not intended to replace advice given to you by your health care provider. Make sure you discuss any questions you have with your health care provider. Document Released: 09/16/2009 Document Revised: 04/27/2016 Document Reviewed: 12/25/2014 Elsevier Interactive Patient Education  2017 [employer].

## 2020-12-02 ENCOUNTER — Other Ambulatory Visit: Payer: Self-pay

## 2020-12-02 MED ORDER — METOPROLOL TARTRATE 25 MG PO TABS: 25.0000 mg | ORAL_TABLET | Freq: Two times a day (BID) | ORAL | 0 refills | Status: DC

## 2020-12-28 ENCOUNTER — Other Ambulatory Visit: Payer: Self-pay | Admitting: General Surgery

## 2020-12-28 DIAGNOSIS — Z86010 Personal history of colonic polyps: Secondary | ICD-10-CM | POA: Diagnosis not present

## 2020-12-28 DIAGNOSIS — Z800 Family history of malignant neoplasm of digestive organs: Secondary | ICD-10-CM | POA: Diagnosis not present

## 2020-12-28 NOTE — Progress Notes (Signed)
Subjective:     Patient ID: Charles Floyd is a 70 y.o. male.  HPI  The following portions of the patient's history were reviewed and updated as appropriate.  This an established patient is here today for: office visit. The patient has been referred by Vernie Murders, PA for evaluation of a colonoscopy. Patient reports his last colonoscopy was done in 2015. He reports his bowels move about every other day. Patient reports a history of colon cancer in his mother.       Chief Complaint  Patient presents with  . Colonoscopy     BP 116/78   Pulse 78   Ht 180.3 cm (5\' 11" )   Wt (!) 163.7 kg (361 lb)   SpO2 91%   BMI 50.35 kg/m       Past Medical History:  Diagnosis Date  . A-fib (CMS-HCC) 08/2019  . Arthritis   . Asthma   . Gout   . History of chicken pox   . History of kidney stones   . History of measles   . History of mumps   . Panic attacks   . Prostate cancer (CMS-HCC) 2012  . Sleep apnea           Past Surgical History:  Procedure Laterality Date  . CHOLECYSTECTOMY  09/18/2017  . COLONOSCOPY  12/2001  . COLONOSCOPY     12-17-2013  . kidney stone extraction  2015  . LAPAROSCOPIC PROSTATECTOMY  05/2011  . LUMBAR EPIDURAL INJECTION  02/16/2020  . prostate removal  05/16/2011       Social History          Socioeconomic History  . Marital status: Married    Spouse name: Not on file  . Number of children: Not on file  . Years of education: Not on file  . Highest education level: Not on file  Occupational History  . Not on file  Tobacco Use  . Smoking status: Never Smoker  . Smokeless tobacco: Never Used  Vaping Use  . Vaping Use: Never used  Substance and Sexual Activity  . Alcohol use: Yes    Comment: rarely/special events  . Drug use: Not on file  . Sexual activity: Not on file  Other Topics Concern  . Not on file  Social History Narrative  . Not on file   Social Determinants of Health   Financial  Resource Strain: Not on file  Food Insecurity: Not on file  Transportation Needs: Not on file       No Known Allergies  Current Medications        Current Outpatient Medications  Medication Sig Dispense Refill  . acetaminophen (TYLENOL) 650 MG ER tablet Take by mouth    . allopurinol (ZYLOPRIM) 300 MG tablet Take by mouth as needed       . apixaban (ELIQUIS) 5 mg tablet Take 5 mg by mouth every 12 (twelve) hours       . metoprolol tartrate (LOPRESSOR) 25 MG tablet Take by mouth    . gabapentin (NEURONTIN) 300 MG capsule Take 1 capsule (300 mg total) by mouth nightly for 60 doses Increase to 2 at night time after 1 week, if no improvement. 60 capsule 1  . traMADoL (ULTRAM) 50 mg tablet Take 1 tablet (50 mg total) by mouth every 6 (six) hours as needed for Pain for up to 40 doses (Patient not taking: Reported on 12/28/2020  ) 40 tablet 1   No current facility-administered medications for this visit.  Family History  Problem Relation Age of Onset  . Colon cancer Mother   . Breast cancer Mother   . Stroke Mother   . Coronary Artery Disease (Blocked arteries around heart) Mother   . Anemia Mother   . Coronary Artery Disease (Blocked arteries around heart) Father   . Tuberculosis Father   . Heart disease Father   . Heart failure Father   . Emphysema Father       Review of Systems  Constitutional: Negative for chills and fever.  Respiratory: Negative for cough.        Objective:   Physical Exam Constitutional:      Appearance: Normal appearance.  Cardiovascular:     Rate and Rhythm: Normal rate and regular rhythm.     Pulses: Normal pulses.     Heart sounds: Normal heart sounds.  Pulmonary:     Effort: Pulmonary effort is normal.     Breath sounds: Normal breath sounds.  Neurological:     Mental Status: He is alert and oriented to person, place, and time.  Psychiatric:        Mood and Affect: Mood normal.        Behavior:  Behavior normal.    Labs and Radiology:   December 17, 2013 colonoscopy:  Diagnosis:  Part A: DISTAL TRANSVERSE COLON POLYP COLD BIOPSY:  - TUBULAR ADENOMA.  - NEGATIVE FOR HIGH GRADE DYSPLASIA AND MALIGNANCY.  .  Part B: DISTAL TRANSVERSE COLON POLYP HOT SNARE:  - TUBULAR ADENOMA (MULTIPLE FRAGMENTS).  - NEGATIVE FOR HIGH GRADE DYSPLASIA AND MALIGNANCY.  Marland Kitchen       Assessment:     Candidate for repeat colonoscopy.  History of sleep apnea, no compliance with CPAP.    Plan:     With his reports of mild increased constipation will make of use of Dulcolax prior to the MiraLAX prep.  He will hold his Eliquis 2 days prior to minimize the risk of bleeding.  The importance of the CPAP unit to minimize the risk of pulmonary hypertension was emphasized. He reports being seen by pulmonary and then had no call back. He was encouraged to call pulmonary and get back in with the sleep apnea people.Jenny Reichmann by Ledell Noss, CMA, acting as a scribe for Dr. Hervey Ard, MD.   The documentation recorded by the scribe accurately reflects the service I personally performed and the decisions made by me.   Robert Bellow, MD FACS

## 2021-01-03 ENCOUNTER — Ambulatory Visit: Payer: Medicare Other | Admitting: Cardiology

## 2021-01-03 ENCOUNTER — Other Ambulatory Visit
Admission: RE | Admit: 2021-01-03 | Discharge: 2021-01-03 | Disposition: A | Discharge status: Home / Self Care | Payer: Medicare Other | Source: Ambulatory Visit | Attending: General Surgery | Admitting: General Surgery

## 2021-01-03 DIAGNOSIS — U071 COVID-19: Secondary | ICD-10-CM | POA: Diagnosis not present

## 2021-01-03 DIAGNOSIS — H2513 Age-related nuclear cataract, bilateral: Secondary | ICD-10-CM | POA: Diagnosis not present

## 2021-01-03 DIAGNOSIS — Z01812 Encounter for preprocedural laboratory examination: Secondary | ICD-10-CM | POA: Diagnosis not present

## 2021-01-03 LAB — SARS CORONAVIRUS 2 (TAT 6-24 HRS): SARS Coronavirus 2: POSITIVE — AB

## 2021-01-04 ENCOUNTER — Telehealth: Payer: Self-pay | Admitting: Nurse Practitioner

## 2021-01-04 ENCOUNTER — Telehealth (HOSPITAL_COMMUNITY): Payer: Self-pay | Admitting: Family

## 2021-01-04 ENCOUNTER — Encounter: Payer: Self-pay | Admitting: Nurse Practitioner

## 2021-01-04 DIAGNOSIS — Z01812 Encounter for preprocedural laboratory examination: Secondary | ICD-10-CM | POA: Insufficient documentation

## 2021-01-04 DIAGNOSIS — U071 COVID-19: Secondary | ICD-10-CM | POA: Insufficient documentation

## 2021-01-04 NOTE — Telephone Encounter (Signed)
Called to discuss with patient about COVID-19 symptoms and the use of one of the available treatments for those with mild to moderate Covid symptoms and at a high risk of hospitalization.  Pt appears to qualify for outpatient treatment due to co-morbid conditions and/or a member of an at-risk group in accordance with the FDA Emergency Use Authorization.    Unable to reach pt - VM left  Charles Floyd   

## 2021-01-04 NOTE — Telephone Encounter (Signed)
Called to Discuss with patient about Covid symptoms and the use of the monoclonal antibody infusion for those with mild to moderate Covid symptoms and at a high risk of hospitalization.     Pt appears to qualify for this infusion due to co-morbid conditions and/or a member of an at-risk group in accordance with the FDA Emergency Use Authorization.    Unable to reach pt. Voicemail left with callback instructions and MyChart message sent.   Symptom onset: unknown Vaccinated: unknown Qualified for Infusion: Unknown- medical hx positive for prostate cancer and OSA. Will need to discuss symptoms and additional medical history to determine qualifications.

## 2021-01-05 ENCOUNTER — Encounter: Admission: RE | Payer: Self-pay | Source: Home / Self Care

## 2021-01-05 ENCOUNTER — Telehealth: Payer: Self-pay | Admitting: Family

## 2021-01-05 ENCOUNTER — Ambulatory Visit: Admission: RE | Admit: 2021-01-05 | Payer: Medicare Other | Source: Home / Self Care | Admitting: General Surgery

## 2021-01-05 SURGERY — COLONOSCOPY WITH PROPOFOL
Anesthesia: General

## 2021-01-05 NOTE — Telephone Encounter (Signed)
Called to discuss with patient about COVID-19 symptoms and the use of one of the available treatments for those with mild to moderate Covid symptoms and at a high risk of hospitalization.  Pt appears to qualify for outpatient treatment due to co-morbid conditions and/or a member of an at-risk group in accordance with the FDA Emergency Use Authorization.    Symptom onset: Has chronic cough and has not had any worsening symptoms Vaccinated: Yes Booster? Yes  Immunocompromised? No  Qualifiers: Age, morbid obesity (BMI 49), obstructive sleep apnea   I spoke with Mr. Mccaster and currently he does not have any new symptoms and has a chronic cough that has been going on since the end of December. Overall feeling okay. If he did not have a colonoscopy he informed me he would not have tested. Post-Covid Care clinic information provided if needed.  Terri Piedra, NP 01/05/2021 9:44 AM

## 2021-01-20 ENCOUNTER — Telehealth: Payer: Self-pay | Admitting: Cardiology

## 2021-01-20 ENCOUNTER — Encounter: Payer: Self-pay | Admitting: Cardiology

## 2021-01-20 ENCOUNTER — Ambulatory Visit (INDEPENDENT_AMBULATORY_CARE_PROVIDER_SITE_OTHER): Payer: Medicare Other | Admitting: Cardiology

## 2021-01-20 ENCOUNTER — Other Ambulatory Visit: Payer: Self-pay

## 2021-01-20 VITALS — BP 114/76 | HR 77 | Ht 71.0 in | Wt 360.0 lb

## 2021-01-20 DIAGNOSIS — H25813 Combined forms of age-related cataract, bilateral: Secondary | ICD-10-CM | POA: Diagnosis not present

## 2021-01-20 DIAGNOSIS — G4730 Sleep apnea, unspecified: Secondary | ICD-10-CM

## 2021-01-20 DIAGNOSIS — I4819 Other persistent atrial fibrillation: Secondary | ICD-10-CM | POA: Diagnosis not present

## 2021-01-20 DIAGNOSIS — Z6841 Body Mass Index (BMI) 40.0 and over, adult: Secondary | ICD-10-CM | POA: Diagnosis not present

## 2021-01-20 NOTE — Patient Instructions (Signed)
Medication Instructions:  Your physician recommends that you continue on your current medications as directed. Please refer to the Current Medication list given to you today.  *If you need a refill on your cardiac medications before your next appointment, please call your pharmacy*   Lab Work: None ordered If you have labs (blood work) drawn today and your tests are completely normal, you will receive your results only by: Marland Kitchen MyChart Message (if you have MyChart) OR . A paper copy in the mail If you have any lab test that is abnormal or we need to change your treatment, we will call you to review the results.   Testing/Procedures: None ordered   Follow-Up: At [facility], you and your health needs are our priority.  As part of our continuing mission to provide you with exceptional heart care, we have created designated Provider Care Teams.  These Care Teams include your primary Cardiologist (physician) and Advanced Practice Providers (APPs -  Physician Assistants and Nurse Practitioners) who all work together to provide you with the care you need, when you need it.  We recommend signing up for the patient portal called "MyChart".  Sign up information is provided on this After Visit Summary.  MyChart is used to connect with patients for Virtual Visits (Telemedicine).  Patients are able to view lab/test results, encounter notes, upcoming appointments, etc.  Non-urgent messages can be sent to your provider as well.   To learn more about what you can do with MyChart, go to [email or web address].    Your next appointment:   6 week(s)  The format for your next appointment:   In Person  Provider:   Kate Sable, MD   Other Instructions N/A

## 2021-01-20 NOTE — Progress Notes (Signed)
Cardiology Office Note:    Date:  01/20/2021   ID:  Charles Floyd, DOB 1951-04-29, MRN [id number]  PCP:  Margo Common, PA-C  Cardiologist:  Kate Sable, MD  Electrophysiologist:  None   Referring MD: Margo Common, PA-C   Chief Complaint  Patient presents with  . Follow-up    6 months     History of Present Illness:    Charles Floyd is a 70 y.o. male with a hx of OSA, persistent atrial fibrillation, obesity who presents for a 42-month follow-up.    Patient is being seen due to persistent atrial fibrillation.  Tolerating Eliquis and Lopressor.  Denies palpitations, but he notes his heart rate occasionally goes up to 120s for a few seconds then comes back to normal.  He feels fatigued after these episodes.  He is planning on getting a colonoscopy next week which will require holding Eliquis.  Also has an appointment to evaluate for cataracts and possible surgery in the near future.  Prior notes echocardiogram 09/2019 showed normal systolic and diastolic function, EF 55 to 60%.    Past Medical History:  Diagnosis Date  . Arthritis    BACK  . Asthma    AS A CHILD-NO INHALERS AS AN ADULT  . Atrial fibrillation ([place] [facility] [place]) 08/2019  . Cancer of prostate [facility]) 2012   Robotic Assisted prostatectomy 2012 [facility]  . Gout   . Gout   . History of chicken pox   . History of kidney stones    H/O  . History of measles   . History of mumps   . Panic attacks   . Sleep apnea    using CPAP occasionally     Past Surgical History:  Procedure Laterality Date  . CHOLECYSTECTOMY N/A 09/18/2017   Procedure: LAPAROSCOPIC CHOLECYSTECTOMY WITH INTRAOPERATIVE CHOLANGIOGRAM;  Surgeon: Robert Bellow, MD;  Location: [facility] ORS;  Service: General;  Laterality: N/A;  . COLONOSCOPY  Jan 2003, 12-17-13   Dr Bary Castilla  . kidney stone extraction  07/2013   stent placed  . PROSTATE SURGERY  2012  . PROSTATECTOMY  05/16/2011   [facility]. Abdominal Laparoscopic, robotic assisted     Current Medications: Current Meds  Medication Sig  . acetaminophen (TYLENOL) 650 MG CR tablet Take 1,300 mg by mouth every 8 (eight) hours as needed for pain.  Marland Kitchen allopurinol (ZYLOPRIM) 300 MG tablet TAKE 1 TABLET (300 MG TOTAL) BY MOUTH DAILY. AS NEEDED FOR GOUT  . apixaban (ELIQUIS) 5 MG TABS tablet Take 1 tablet (5 mg total) by mouth 2 (two) times daily.  Marland Kitchen gabapentin (NEURONTIN) 300 MG capsule Take by mouth.  . metoprolol tartrate (LOPRESSOR) 25 MG tablet Take 1 tablet (25 mg total) by mouth 2 (two) times daily.     Allergies:   Patient has no known allergies.   Social History   Socioeconomic History  . Marital status: Married    Spouse name: Not on file  . Number of children: 2  . Years of education: Not on file  . Highest education level: Master's degree (e.g., MA, MS, MEng, MEd, MSW, MBA)  Occupational History  . Occupation: Archivist  Tobacco Use  . Smoking status: Never Smoker  . Smokeless tobacco: Never Used  Vaping Use  . Vaping Use: Never used  Substance and Sexual Activity  . Alcohol use: Yes    Alcohol/week: 0.0 standard drinks    Comment: occasional use 1 drink every couple weeks (wine or beer)  . Drug use: No  .  Sexual activity: Not on file  Other Topics Concern  . Not on file  Social History Narrative  . Not on file   Social Determinants of Health   Financial Resource Strain: Low Risk   . Difficulty of Paying Living Expenses: Not hard at all  Food Insecurity: No Food Insecurity  . Worried About Charity fundraiser in the Last Year: Never true  . Ran Out of Food in the Last Year: Never true  Transportation Needs: No Transportation Needs  . Lack of Transportation (Medical): No  . Lack of Transportation (Non-Medical): No  Physical Activity: Inactive  . Days of Exercise per Week: 0 days  . Minutes of Exercise per Session: 0 min  Stress: Stress Concern Present  . Feeling of Stress : Rather much  Social Connections: Moderately Integrated  .  Frequency of Communication with Friends and Family: More than three times a week  . Frequency of Social Gatherings with Friends and Family: More than three times a week  . Attends Religious Services: More than 4 times per year  . Active Member of Clubs or Organizations: No  . Attends Archivist Meetings: Never  . Marital Status: Married     Family History: The patient's family history includes Anemia in his mother; CAD in his father and mother; Cancer in his mother; Colon cancer in his mother; Congestive Heart Failure in his father; Emphysema in his father; Tuberculosis in his father.  ROS:   Please see the history of present illness.     All other systems reviewed and are negative.  EKGs/Labs/Other Studies Reviewed:    The following studies were reviewed today: TTE October 03, 2019 1. Left ventricular ejection fraction, by visual estimation, is 55 to 60%. The left ventricle has normal function. Normal left ventricular size. There is borderline left ventricular hypertrophy.  2. Left ventricular diastolic function could not be evaluated pattern of LV diastolic filling.  3. Global right ventricle has normal systolic function.The right ventricular size is normal. Right vetricular wall thickness was not assessed.  4. Left atrial size was normal.  5. Right atrial size was normal.  6. The mitral valve is normal in structure. Trace mitral valve regurgitation.  7. The tricuspid valve is normal in structure. Tricuspid valve regurgitation is mild.  8. The aortic valve is normal in structure. Aortic valve regurgitation was not visualized by color flow Doppler.  9. The pulmonic valve was not well visualized. Pulmonic valve regurgitation is not visualized by color flow Doppler. 10. Mildly elevated pulmonary artery systolic pressure.  EKG:  EKG is  ordered today.  The ekg ordered today demonstrates atrial fibrillation, heart rate 77.  Recent Labs: No results found for requested labs within  last 8760 hours.  Recent Lipid Panel    Component Value Date/Time   CHOL 149 03/24/2016 0901   TRIG 56 03/24/2016 0901   HDL 38 (L) 03/24/2016 0901   CHOLHDL 3.9 03/24/2016 0901   LDLCALC 100 (H) 03/24/2016 0901    Physical Exam:    VS:  BP 114/76   Pulse 77   Ht 5\' 11"  (1.803 m)   Wt (!) 360 lb (163.3 kg)   BMI 50.21 kg/m     Wt Readings from Last 3 Encounters:  01/20/21 (!) 360 lb (163.3 kg)  07/01/20 (!) 355 lb (161 kg)  12/23/19 (!) 359 lb 6.4 oz (163 kg)     GEN:  Well nourished, well developed in no acute distress, obese HEENT: Normal NECK: No JVD;  No carotid bruits LYMPHATICS: No lymphadenopathy CARDIAC: Irregular irregular, distant heart sounds RESPIRATORY: Clear anteriorly, decreased at bases. ABDOMEN: Soft, non-tender, non-distended MUSCULOSKELETAL:  No edema; No deformity  SKIN: Warm and dry NEUROLOGIC:  Alert and oriented x 3 PSYCHIATRIC:  Normal affect   ASSESSMENT:    1. Persistent atrial fibrillation ([place])   2. Morbid obesity with BMI of 45.0-49.9, adult ([place])   3. Sleep apnea, unspecified type      PLAN:    In order of problems listed above:  1. Patient with history of persistent atrial fibrillation, last echocardiogram with normal ejection fraction, EF 55 to 60%.  Clinically asymptomatic occasional fatigue after elevated heart rates.  CHA2DS2-VASc score of 1 .  Continue Lopressor 25 mg twice daily. Eliquis 5 mg twice daily.  Hold Eliquis 48 hours prior to surgical procedure use, restart as soon as possible after.  We will plan for DC cardioversion after at least 3 weeks of uninterrupted anticoagulation. 2. Patient is morbidly obese, weight loss, low calorie diet advised 3. Patient with history of sleep apnea, management as per pulmonary medicine.  Follow-up in 6 weeks.  Total encounter time 35 minutes  Greater than 50% was spent in counseling and coordination of care with the patient  Medication Adjustments/Labs and Tests Ordered: Current  medicines are reviewed at length with the patient today.  Concerns regarding medicines are outlined above.  Orders Placed This Encounter  Procedures  . EKG 12-Lead   No orders of the defined types were placed in this encounter.   Patient Instructions  Medication Instructions:  Your physician recommends that you continue on your current medications as directed. Please refer to the Current Medication list given to you today.  *If you need a refill on your cardiac medications before your next appointment, please call your pharmacy*   Lab Work: None ordered If you have labs (blood work) drawn today and your tests are completely normal, you will receive your results only by: Marland Kitchen MyChart Message (if you have MyChart) OR . A paper copy in the mail If you have any lab test that is abnormal or we need to change your treatment, we will call you to review the results.   Testing/Procedures: None ordered   Follow-Up: At [facility], you and your health needs are our priority.  As part of our continuing mission to provide you with exceptional heart care, we have created designated Provider Care Teams.  These Care Teams include your primary Cardiologist (physician) and Advanced Practice Providers (APPs -  Physician Assistants and Nurse Practitioners) who all work together to provide you with the care you need, when you need it.  We recommend signing up for the patient portal called "MyChart".  Sign up information is provided on this After Visit Summary.  MyChart is used to connect with patients for Virtual Visits (Telemedicine).  Patients are able to view lab/test results, encounter notes, upcoming appointments, etc.  Non-urgent messages can be sent to your provider as well.   To learn more about what you can do with MyChart, go to [email or web address].    Your next appointment:   6 week(s)  The format for your next appointment:   In Person  Provider:   Kate Sable, MD   Other  Instructions N/A     Signed, Kate Sable, MD  01/20/2021 12:51 PM    [place]

## 2021-01-20 NOTE — Telephone Encounter (Signed)
6 week fu per avs.    lmov to schedule

## 2021-01-25 ENCOUNTER — Encounter: Payer: Self-pay | Admitting: General Surgery

## 2021-01-26 ENCOUNTER — Ambulatory Visit: Payer: Medicare Other | Admitting: Registered Nurse

## 2021-01-26 ENCOUNTER — Other Ambulatory Visit: Payer: Self-pay

## 2021-01-26 ENCOUNTER — Ambulatory Visit
Admission: RE | Admit: 2021-01-26 | Discharge: 2021-01-26 | Disposition: A | Discharge status: Home / Self Care | Payer: Medicare Other | Attending: General Surgery | Admitting: General Surgery

## 2021-01-26 ENCOUNTER — Encounter
Admission: RE | Disposition: A | Discharge status: Home / Self Care | Payer: Self-pay | Source: Home / Self Care | Attending: General Surgery

## 2021-01-26 DIAGNOSIS — Z8546 Personal history of malignant neoplasm of prostate: Secondary | ICD-10-CM | POA: Diagnosis not present

## 2021-01-26 DIAGNOSIS — G4733 Obstructive sleep apnea (adult) (pediatric): Secondary | ICD-10-CM | POA: Diagnosis not present

## 2021-01-26 DIAGNOSIS — Z8616 Personal history of COVID-19: Secondary | ICD-10-CM | POA: Diagnosis not present

## 2021-01-26 DIAGNOSIS — Z86010 Personal history of colonic polyps: Secondary | ICD-10-CM | POA: Insufficient documentation

## 2021-01-26 DIAGNOSIS — K635 Polyp of colon: Secondary | ICD-10-CM | POA: Diagnosis not present

## 2021-01-26 DIAGNOSIS — Z800 Family history of malignant neoplasm of digestive organs: Secondary | ICD-10-CM | POA: Diagnosis not present

## 2021-01-26 DIAGNOSIS — Z9079 Acquired absence of other genital organ(s): Secondary | ICD-10-CM | POA: Diagnosis not present

## 2021-01-26 DIAGNOSIS — Z79899 Other long term (current) drug therapy: Secondary | ICD-10-CM | POA: Insufficient documentation

## 2021-01-26 DIAGNOSIS — Z1211 Encounter for screening for malignant neoplasm of colon: Secondary | ICD-10-CM | POA: Insufficient documentation

## 2021-01-26 DIAGNOSIS — D122 Benign neoplasm of ascending colon: Secondary | ICD-10-CM | POA: Diagnosis not present

## 2021-01-26 DIAGNOSIS — E559 Vitamin D deficiency, unspecified: Secondary | ICD-10-CM | POA: Diagnosis not present

## 2021-01-26 DIAGNOSIS — Z8371 Family history of colonic polyps: Secondary | ICD-10-CM | POA: Diagnosis not present

## 2021-01-26 DIAGNOSIS — Z6841 Body Mass Index (BMI) 40.0 and over, adult: Secondary | ICD-10-CM | POA: Diagnosis not present

## 2021-01-26 DIAGNOSIS — M109 Gout, unspecified: Secondary | ICD-10-CM | POA: Diagnosis not present

## 2021-01-26 HISTORY — PX: COLONOSCOPY WITH PROPOFOL: SHX5780

## 2021-01-26 SURGERY — COLONOSCOPY WITH PROPOFOL
Anesthesia: General

## 2021-01-26 MED ORDER — PHENYLEPHRINE HCL (PRESSORS) 10 MG/ML IV SOLN
INTRAVENOUS | Status: AC
  Filled 2021-01-26: qty 1

## 2021-01-26 MED ORDER — PROPOFOL 500 MG/50ML IV EMUL
INTRAVENOUS | Status: AC
  Filled 2021-01-26: qty 200

## 2021-01-26 MED ORDER — DEXMEDETOMIDINE HCL 200 MCG/2ML IV SOLN
INTRAVENOUS | Status: DC | PRN
  Administered 2021-01-26: 20 ug via INTRAVENOUS

## 2021-01-26 MED ORDER — APIXABAN 5 MG PO TABS: 5.0000 mg | ORAL_TABLET | Freq: Two times a day (BID) | ORAL | 1 refills | Status: DC

## 2021-01-26 MED ORDER — KETAMINE HCL 50 MG/5ML IJ SOSY
PREFILLED_SYRINGE | INTRAMUSCULAR | Status: AC
  Filled 2021-01-26: qty 5

## 2021-01-26 MED ORDER — KETAMINE HCL 10 MG/ML IJ SOLN
INTRAMUSCULAR | Status: DC | PRN
  Administered 2021-01-26: 50 mg via INTRAVENOUS

## 2021-01-26 MED ORDER — PROPOFOL 10 MG/ML IV BOLUS
INTRAVENOUS | Status: DC | PRN
  Administered 2021-01-26: 60 mg via INTRAVENOUS
  Administered 2021-01-26: 20 mg via INTRAVENOUS

## 2021-01-26 MED ORDER — PHENYLEPHRINE HCL (PRESSORS) 10 MG/ML IV SOLN
INTRAVENOUS | Status: DC | PRN
  Administered 2021-01-26: 100 ug via INTRAVENOUS
  Administered 2021-01-26: 200 ug via INTRAVENOUS
  Administered 2021-01-26: 100 ug via INTRAVENOUS

## 2021-01-26 MED ORDER — LIDOCAINE HCL (CARDIAC) PF 100 MG/5ML IV SOSY
PREFILLED_SYRINGE | INTRAVENOUS | Status: DC | PRN
  Administered 2021-01-26: 100 mg via INTRAVENOUS

## 2021-01-26 MED ORDER — PROPOFOL 500 MG/50ML IV EMUL
INTRAVENOUS | Status: AC
  Filled 2021-01-26: qty 50

## 2021-01-26 MED ORDER — LIDOCAINE HCL (PF) 2 % IJ SOLN
INTRAMUSCULAR | Status: AC
  Filled 2021-01-26: qty 25

## 2021-01-26 MED ORDER — SODIUM CHLORIDE 0.9 % IV SOLN
INTRAVENOUS | Status: DC
  Administered 2021-01-26: 20 mL/h via INTRAVENOUS

## 2021-01-26 MED ORDER — PROPOFOL 500 MG/50ML IV EMUL
INTRAVENOUS | Status: DC | PRN
  Administered 2021-01-26: 100 ug/kg/min via INTRAVENOUS

## 2021-01-26 NOTE — Anesthesia Postprocedure Evaluation (Signed)
Anesthesia Post Note  Patient: Charles Floyd  Procedure(s) Performed: COLONOSCOPY WITH PROPOFOL (N/A )  Patient location during evaluation: Phase II Anesthesia Type: General Level of consciousness: awake and alert, awake and oriented Pain management: pain level controlled Vital Signs Assessment: post-procedure vital signs reviewed and stable Respiratory status: spontaneous breathing, nonlabored ventilation and respiratory function stable Cardiovascular status: blood pressure returned to baseline and stable Postop Assessment: no apparent nausea or vomiting Anesthetic complications: no   No complications documented.   Last Vitals:  Vitals:   01/26/21 0827 01/26/21 0834  BP: 115/80 127/85  Pulse: (!) 101 (!) 107  Resp: (!) 23 14  Temp:    SpO2: 96% 95%    Last Pain:  Vitals:   01/26/21 0834  TempSrc:   PainSc: 0-No pain                 Phill Mutter

## 2021-01-26 NOTE — Transfer of Care (Signed)
Immediate Anesthesia Transfer of Care Note  Patient: Charles Floyd  Procedure(s) Performed: COLONOSCOPY WITH PROPOFOL (N/A )  Patient Location: Endoscopy Unit  Anesthesia Type:General  Level of Consciousness: drowsy  Airway & Oxygen Therapy: Patient Spontanous Breathing and Patient connected to face mask oxygen  Post-op Assessment: Report given to RN and Post -op Vital signs reviewed and stable  Post vital signs: Reviewed and stable  Last Vitals:  Vitals Value Taken Time  BP 99/66 01/26/21 0809  Temp    Pulse 104 01/26/21 0810  Resp 18 01/26/21 0807  SpO2 96 % 01/26/21 0810  Vitals shown include unvalidated device data.  Last Pain:  Vitals:   01/26/21 0807  TempSrc:   PainSc: Asleep         Complications: No complications documented.

## 2021-01-26 NOTE — H&P (Signed)
Charles Floyd [id number] 11-30-1951     HPI:  70 y/o male with family history of colon cancer in his mother.  Recently, asymptomatic with Covid.  For colonoscopy. Tolerated the prep well.   Medications Prior to Admission  Medication Sig Dispense Refill Last Dose  . acetaminophen (TYLENOL) 650 MG CR tablet Take 1,300 mg by mouth every 8 (eight) hours as needed for pain.   Past Week at Unknown time  . allopurinol (ZYLOPRIM) 300 MG tablet TAKE 1 TABLET (300 MG TOTAL) BY MOUTH DAILY. AS NEEDED FOR GOUT 30 tablet 0 Past Week at Unknown time  . apixaban (ELIQUIS) 5 MG TABS tablet Take 1 tablet (5 mg total) by mouth 2 (two) times daily. 180 tablet 1 Past Week at Unknown time  . gabapentin (NEURONTIN) 300 MG capsule Take by mouth.   Past Week at Unknown time  . metoprolol tartrate (LOPRESSOR) 25 MG tablet Take 1 tablet (25 mg total) by mouth 2 (two) times daily. 60 tablet 0 Past Week at Unknown time  . traMADol (ULTRAM) 50 MG tablet Take by mouth every 6 (six) hours as needed.   Past Week at Unknown time   No Known Allergies Past Medical History:  Diagnosis Date  . Arthritis    BACK  . Asthma    AS A CHILD-NO INHALERS AS AN ADULT  . Atrial fibrillation ([place]) 08/2019  . Cancer of prostate [facility]) 2012   Robotic Assisted prostatectomy 2012 [facility]  . Gout   . Gout   . History of chicken pox   . History of kidney stones    H/O  . History of measles   . History of mumps   . Panic attacks   . Panic attacks   . Sleep apnea    using CPAP occasionally    Past Surgical History:  Procedure Laterality Date  . CHOLECYSTECTOMY N/A 09/18/2017   Procedure: LAPAROSCOPIC CHOLECYSTECTOMY WITH INTRAOPERATIVE CHOLANGIOGRAM;  Surgeon: Robert Bellow, MD;  Location: [facility] ORS;  Service: General;  Laterality: N/A;  . COLONOSCOPY  Jan 2003, 12-17-13   Dr Bary Castilla  . kidney stone extraction  07/2013   stent placed  . PROSTATE SURGERY  2012  . PROSTATECTOMY  05/16/2011   [facility]. Abdominal  Laparoscopic, robotic assisted   Social History   Socioeconomic History  . Marital status: Married    Spouse name: Not on file  . Number of children: 2  . Years of education: Not on file  . Highest education level: Master's degree (e.g., MA, MS, MEng, MEd, MSW, MBA)  Occupational History  . Occupation: Archivist  Tobacco Use  . Smoking status: Never Smoker  . Smokeless tobacco: Never Used  Vaping Use  . Vaping Use: Never used  Substance and Sexual Activity  . Alcohol use: Yes    Alcohol/week: 0.0 standard drinks    Comment: occasional use 1 drink every couple weeks (wine or beer)  . Drug use: No  . Sexual activity: Not on file  Other Topics Concern  . Not on file  Social History Narrative  . Not on file   Social Determinants of Health   Financial Resource Strain: Low Risk   . Difficulty of Paying Living Expenses: Not hard at all  Food Insecurity: No Food Insecurity  . Worried About Charity fundraiser in the Last Year: Never true  . Ran Out of Food in the Last Year: Never true  Transportation Needs: No Transportation Needs  . Lack of Transportation (Medical): No  .  Lack of Transportation (Non-Medical): No  Physical Activity: Inactive  . Days of Exercise per Week: 0 days  . Minutes of Exercise per Session: 0 min  Stress: Stress Concern Present  . Feeling of Stress : Rather much  Social Connections: Moderately Integrated  . Frequency of Communication with Friends and Family: More than three times a week  . Frequency of Social Gatherings with Friends and Family: More than three times a week  . Attends Religious Services: More than 4 times per year  . Active Member of Clubs or Organizations: No  . Attends Archivist Meetings: Never  . Marital Status: Married  Human resources officer Violence: Not At Risk  . Fear of Current or Ex-Partner: No  . Emotionally Abused: No  . Physically Abused: No  . Sexually Abused: No   Social History   Social History Narrative  .  Not on file     ROS: Negative.     PE: HEENT: Negative. Lungs: Clear. Cardio: RR. Assessment/Plan:  Proceed with planned endoscopy.  [facility] 01/26/2021

## 2021-01-26 NOTE — Op Note (Signed)
[facility] Gastroenterology Patient Name: Charles Floyd Procedure Date: 01/26/2021 7:09 AM MRN: [id number] Account #: [id number] Date of Birth: 27-Oct-1951 Admit Type: Outpatient Age: 70 Room: [facility] ENDO ROOM 1 Gender: Male Note Status: Finalized Procedure:             Colonoscopy Indications:           High risk colon cancer surveillance: Personal history                         of colonic polyps, Family history of colon cancer in a                         first-degree relative before age 74 years Providers:             Robert Bellow, MD Referring MD:          Vickki Muff. Chrismon, MD (Referring MD) Medicines:             Monitored Anesthesia Care Complications:         No immediate complications. Procedure:             Pre-Anesthesia Assessment:                        - Prior to the procedure, a History and Physical was                         performed, and patient medications, allergies and                         sensitivities were reviewed. The patient's tolerance                         of previous anesthesia was reviewed.                        - The risks and benefits of the procedure and the                         sedation options and risks were discussed with the                         patient. All questions were answered and informed                         consent was obtained.                        After obtaining informed consent, the colonoscope was                         passed under direct vision. Throughout the procedure,                         the patient's blood pressure, pulse, and oxygen                         saturations were monitored continuously. The  Colonoscope was introduced through the anus and                         advanced to the the terminal ileum. The colonoscopy                         was somewhat difficult due to a tortuous colon.                         Successful completion of the procedure  was aided by                         using manual pressure. The patient tolerated the                         procedure well. The quality of the bowel preparation                         was excellent.                        On entering the cecum, the mucosa of the terminal                         ileum was protruding thru the valve, giving the                         appearance 3f a polyp. No longer evident after                         cannulation of the TI. Findings:      A 5 mm polyp was found in the mid ascending colon. The polyp was       sessile. Biopsies were taken with a cold forceps for histology.      The retroflexed view of the distal rectum and anal verge was normal and       showed no anal or rectal abnormalities. Impression:            - One 5 mm polyp in the mid ascending colon. Biopsied.                        - The distal rectum and anal verge are normal on                         retroflexion view. Recommendation:        - Repeat colonoscopy in 5 years for surveillance. Procedure Code(s):     --- Professional ---                        [phone], Colonoscopy, flexible; with biopsy, single or                         multiple Diagnosis Code(s):     --- Professional ---                        Z86.010, Personal history of colonic polyps  K63.5, Polyp of colon                        Z80.0, Family history of malignant neoplasm of                         digestive organs CPT copyright 2019 American Medical Association. All rights reserved. The codes documented in this report are preliminary and upon coder review may  be revised to meet current compliance requirements. Robert Bellow, MD 01/26/2021 8:07:56 AM This report has been signed electronically. Number of Addenda: 0 Note Initiated On: 01/26/2021 7:09 AM Scope Withdrawal Time: 0 hours 18 minutes 13 seconds  Total Procedure Duration: 0 hours 23 minutes 34 seconds  Estimated Blood Loss:  Estimated  blood loss: none.      [facility]

## 2021-01-26 NOTE — Anesthesia Preprocedure Evaluation (Signed)
Anesthesia Evaluation  Patient identified by MRN, date of birth, ID band Patient awake    Reviewed: Allergy & Precautions, NPO status , Patient's Chart, lab work & pertinent test results  Airway Mallampati: II  TM Distance: >3 FB Neck ROM: Full    Dental  (+) Teeth Intact, Partial Upper, Partial Lower   Pulmonary asthma , sleep apnea and Continuous Positive Airway Pressure Ventilation ,     + decreased breath sounds      Cardiovascular Exercise Tolerance: Good  Rhythm:Regular Rate:Normal     Neuro/Psych Anxiety negative neurological ROS     GI/Hepatic negative GI ROS, Neg liver ROS, Bowel prep,  Endo/Other  negative endocrine ROSMorbid obesity  Renal/GU negative Renal ROS  negative genitourinary   Musculoskeletal  (+) Arthritis , Osteoarthritis,    Abdominal (+) + obese,   Peds negative pediatric ROS (+)  Hematology   Anesthesia Other Findings BACK Asthma  AS A CHILD-NO INHALERS AS AN ADULT Atrial fibrillation ([facility]) 08/2019 Cancer of prostate [facility]) 2012  Robotic Assisted prostatectomy 2012 [facility]  Gout  History of chicken pox  History of kidney stones  H/O History of measles  History of mumps  Panic attacks  Sleep apnea   using CPAP occasionally  MORBID OBESITY   Reproductive/Obstetrics                             Anesthesia Physical  Anesthesia Plan  ASA: III  Anesthesia Plan: General   Post-op Pain Management:    Induction: Intravenous  PONV Risk Score and Plan: 2 and Propofol infusion and TIVA  Airway Management Planned: Natural Airway and Nasal Cannula  Additional Equipment:   Intra-op Plan:   Post-operative Plan: Extubation in OR  Informed Consent: I have reviewed the patients History and Physical, chart, labs and discussed the procedure including the risks, benefits and alternatives for the proposed anesthesia with the patient or authorized  representative who has indicated his/her understanding and acceptance.       Plan Discussed with: CRNA and Anesthesiologist  Anesthesia Plan Comments:         Anesthesia Quick Evaluation

## 2021-01-27 ENCOUNTER — Encounter: Payer: Self-pay | Admitting: General Surgery

## 2021-01-27 LAB — SURGICAL PATHOLOGY

## 2021-02-21 DIAGNOSIS — H25813 Combined forms of age-related cataract, bilateral: Secondary | ICD-10-CM | POA: Diagnosis not present

## 2021-02-22 NOTE — Telephone Encounter (Signed)
Wants to wait til after cataract surgery

## 2021-03-22 DIAGNOSIS — I480 Paroxysmal atrial fibrillation: Secondary | ICD-10-CM | POA: Diagnosis not present

## 2021-03-22 DIAGNOSIS — H25811 Combined forms of age-related cataract, right eye: Secondary | ICD-10-CM | POA: Diagnosis not present

## 2021-03-22 DIAGNOSIS — Z9989 Dependence on other enabling machines and devices: Secondary | ICD-10-CM | POA: Diagnosis not present

## 2021-03-22 DIAGNOSIS — Z79899 Other long term (current) drug therapy: Secondary | ICD-10-CM | POA: Diagnosis not present

## 2021-03-22 DIAGNOSIS — Z7901 Long term (current) use of anticoagulants: Secondary | ICD-10-CM | POA: Diagnosis not present

## 2021-03-22 DIAGNOSIS — H52221 Regular astigmatism, right eye: Secondary | ICD-10-CM | POA: Diagnosis not present

## 2021-03-22 DIAGNOSIS — Z87442 Personal history of urinary calculi: Secondary | ICD-10-CM | POA: Diagnosis not present

## 2021-03-22 DIAGNOSIS — G4733 Obstructive sleep apnea (adult) (pediatric): Secondary | ICD-10-CM | POA: Diagnosis not present

## 2021-03-22 DIAGNOSIS — Z8546 Personal history of malignant neoplasm of prostate: Secondary | ICD-10-CM | POA: Diagnosis not present

## 2021-03-22 DIAGNOSIS — Z6841 Body Mass Index (BMI) 40.0 and over, adult: Secondary | ICD-10-CM | POA: Diagnosis not present

## 2021-03-22 DIAGNOSIS — J45909 Unspecified asthma, uncomplicated: Secondary | ICD-10-CM | POA: Diagnosis not present

## 2021-03-22 DIAGNOSIS — G8929 Other chronic pain: Secondary | ICD-10-CM | POA: Diagnosis not present

## 2021-04-03 HISTORY — PX: CATARACT EXTRACTION: SUR2

## 2021-04-05 DIAGNOSIS — H52222 Regular astigmatism, left eye: Secondary | ICD-10-CM | POA: Diagnosis not present

## 2021-04-05 DIAGNOSIS — G4730 Sleep apnea, unspecified: Secondary | ICD-10-CM | POA: Diagnosis not present

## 2021-04-05 DIAGNOSIS — I480 Paroxysmal atrial fibrillation: Secondary | ICD-10-CM | POA: Diagnosis not present

## 2021-04-05 DIAGNOSIS — Z9049 Acquired absence of other specified parts of digestive tract: Secondary | ICD-10-CM | POA: Diagnosis not present

## 2021-04-05 DIAGNOSIS — Z9079 Acquired absence of other genital organ(s): Secondary | ICD-10-CM | POA: Diagnosis not present

## 2021-04-05 DIAGNOSIS — Z79899 Other long term (current) drug therapy: Secondary | ICD-10-CM | POA: Diagnosis not present

## 2021-04-05 DIAGNOSIS — Z6841 Body Mass Index (BMI) 40.0 and over, adult: Secondary | ICD-10-CM | POA: Diagnosis not present

## 2021-04-05 DIAGNOSIS — J45909 Unspecified asthma, uncomplicated: Secondary | ICD-10-CM | POA: Diagnosis not present

## 2021-04-05 DIAGNOSIS — E559 Vitamin D deficiency, unspecified: Secondary | ICD-10-CM | POA: Diagnosis not present

## 2021-04-05 DIAGNOSIS — G4733 Obstructive sleep apnea (adult) (pediatric): Secondary | ICD-10-CM | POA: Diagnosis not present

## 2021-04-05 DIAGNOSIS — Z7901 Long term (current) use of anticoagulants: Secondary | ICD-10-CM | POA: Diagnosis not present

## 2021-04-05 DIAGNOSIS — H25812 Combined forms of age-related cataract, left eye: Secondary | ICD-10-CM | POA: Diagnosis not present

## 2021-04-05 DIAGNOSIS — M109 Gout, unspecified: Secondary | ICD-10-CM | POA: Diagnosis not present

## 2021-04-25 ENCOUNTER — Telehealth: Payer: Self-pay | Admitting: Cardiology

## 2021-04-25 NOTE — Telephone Encounter (Signed)
-----   Message from Horton Finer sent at 04/25/2021 10:02 AM EDT ----- Regarding: needs appointment Please schedule overdue F/U appointment for refills. Thank you!

## 2021-04-25 NOTE — Telephone Encounter (Signed)
Attempted to schedule no ans no vm  

## 2021-04-27 ENCOUNTER — Other Ambulatory Visit: Payer: Self-pay

## 2021-04-27 MED ORDER — METOPROLOL TARTRATE 25 MG PO TABS: 25.0000 mg | ORAL_TABLET | Freq: Two times a day (BID) | ORAL | 0 refills | Status: DC

## 2021-05-03 NOTE — Telephone Encounter (Signed)
You routed conversation to [facility] [employer] Scheduling 8 days ago   You 8 days ago   [facility]    Attempted to schedule no ans no vm        Documentation    You  Kvion, Shapley "[name]" 8 days ago   You 8 days ago   [facility]    ----- Message from Horton Finer sent at 04/25/2021 10:02 AM EDT ----- Regarding: needs appointment Please schedule overdue F/U appointment for refills. Thank you!

## 2021-06-01 DIAGNOSIS — L570 Actinic keratosis: Secondary | ICD-10-CM | POA: Diagnosis not present

## 2021-06-01 DIAGNOSIS — D492 Neoplasm of unspecified behavior of bone, soft tissue, and skin: Secondary | ICD-10-CM | POA: Diagnosis not present

## 2021-06-01 DIAGNOSIS — D229 Melanocytic nevi, unspecified: Secondary | ICD-10-CM | POA: Diagnosis not present

## 2021-06-01 DIAGNOSIS — L814 Other melanin hyperpigmentation: Secondary | ICD-10-CM | POA: Diagnosis not present

## 2021-06-01 DIAGNOSIS — L821 Other seborrheic keratosis: Secondary | ICD-10-CM | POA: Diagnosis not present

## 2021-06-01 DIAGNOSIS — D0361 Melanoma in situ of right upper limb, including shoulder: Secondary | ICD-10-CM | POA: Diagnosis not present

## 2021-06-15 DIAGNOSIS — Z8739 Personal history of other diseases of the musculoskeletal system and connective tissue: Secondary | ICD-10-CM | POA: Diagnosis not present

## 2021-06-15 DIAGNOSIS — M109 Gout, unspecified: Secondary | ICD-10-CM | POA: Diagnosis not present

## 2021-06-15 DIAGNOSIS — L03115 Cellulitis of right lower limb: Secondary | ICD-10-CM | POA: Diagnosis not present

## 2021-06-17 ENCOUNTER — Other Ambulatory Visit: Payer: Self-pay

## 2021-06-17 NOTE — Telephone Encounter (Signed)
Pt overdue for 6 wks f/u. Pt needing refills. Please contact pt for future appointment.

## 2021-06-20 ENCOUNTER — Encounter: Payer: Self-pay | Admitting: Cardiology

## 2021-06-20 ENCOUNTER — Other Ambulatory Visit: Payer: Self-pay

## 2021-06-20 ENCOUNTER — Ambulatory Visit (INDEPENDENT_AMBULATORY_CARE_PROVIDER_SITE_OTHER): Payer: Medicare Other | Admitting: Cardiology

## 2021-06-20 VITALS — BP 122/94 | HR 88 | Ht 71.0 in | Wt 355.0 lb

## 2021-06-20 DIAGNOSIS — G4730 Sleep apnea, unspecified: Secondary | ICD-10-CM | POA: Diagnosis not present

## 2021-06-20 DIAGNOSIS — Z6841 Body Mass Index (BMI) 40.0 and over, adult: Secondary | ICD-10-CM | POA: Diagnosis not present

## 2021-06-20 DIAGNOSIS — I4819 Other persistent atrial fibrillation: Secondary | ICD-10-CM | POA: Diagnosis not present

## 2021-06-20 MED ORDER — METOPROLOL TARTRATE 25 MG PO TABS: 25.0000 mg | ORAL_TABLET | Freq: Two times a day (BID) | ORAL | 0 refills | Status: DC

## 2021-06-20 NOTE — H&P (View-Only) (Signed)
Cardiology Office Note:    Date:  06/20/2021   ID:  Charles Floyd, DOB [birth year], MRN [id number]  PCP:  Margo Common, PA-C  Cardiologist:  Kate Sable, MD  Electrophysiologist:  None   Referring MD: Margo Common, PA-C   Chief Complaint  Patient presents with   Other    Past due follow up - Patient c.o SOB and that his Afib alert goes off on his watch about 2-3 times a day. Meds reviewed verbally with patient.    History of Present Illness:    Charles Floyd is a 70 y.o. male with a hx of OSA, persistent atrial fibrillation, obesity who presents for follow-up.    Patient is being seen due to persistent atrial fibrillation.  Tolerating Eliquis and Lopressor without any bleeding issues.  Denies palpitations.  had a cataract procedure about 6 weeks ago was required holding Eliquis.  Has been taking Eliquis as prescribed over the past month without interruption.  He endorses not using his CPAP frequently.  Supposed to follow-up with pulmonary medicine/sleep specialist for retest of sleep apnea and CPAP titration.    Prior notes echocardiogram 09/2019 showed normal systolic and diastolic function, EF 55 to 60%.    Past Medical History:  Diagnosis Date   Arthritis    BACK   Asthma    AS A CHILD-NO INHALERS AS AN ADULT   Atrial fibrillation ([place]) 08/2019   Cancer of prostate [facility]) 2012   Robotic Assisted prostatectomy 2012 [facility]   Gout    Gout    History of chicken pox    History of kidney stones    H/O   History of measles    History of mumps    Panic attacks    Panic attacks    Sleep apnea    using CPAP occasionally     Past Surgical History:  Procedure Laterality Date   CHOLECYSTECTOMY N/A 09/18/2017   Procedure: LAPAROSCOPIC CHOLECYSTECTOMY WITH INTRAOPERATIVE CHOLANGIOGRAM;  Surgeon: Robert Bellow, MD;  Location: [facility] ORS;  Service: General;  Laterality: N/A;   COLONOSCOPY  Jan 2003, 12-17-13   Dr Bary Castilla   COLONOSCOPY WITH  PROPOFOL N/A 01/26/2021   Procedure: COLONOSCOPY WITH PROPOFOL;  Surgeon: Robert Bellow, MD;  Location: [facility] ENDOSCOPY;  Service: Endoscopy;  Laterality: N/A;  COVID POSITIVE 01/03/2021   kidney stone extraction  07/2013   stent placed   PROSTATE SURGERY  2012   PROSTATECTOMY  05/16/2011   [facility]. Abdominal Laparoscopic, robotic assisted    Current Medications: Current Meds  Medication Sig   acetaminophen (TYLENOL) 650 MG CR tablet Take 1,300 mg by mouth every 8 (eight) hours as needed for pain.   allopurinol (ZYLOPRIM) 300 MG tablet TAKE 1 TABLET (300 MG TOTAL) BY MOUTH DAILY. AS NEEDED FOR GOUT   apixaban (ELIQUIS) 5 MG TABS tablet Take 1 tablet (5 mg total) by mouth 2 (two) times daily.   doxycycline (VIBRAMYCIN) 100 MG capsule Take 100 mg by mouth 2 (two) times daily.   [DISCONTINUED] metoprolol tartrate (LOPRESSOR) 25 MG tablet Take 1 tablet (25 mg total) by mouth 2 (two) times daily.     Allergies:   Patient has no known allergies.   Social History   Socioeconomic History   Marital status: Married    Spouse name: Not on file   Number of children: 2   Years of education: Not on file   Highest education level: Master's degree (e.g., MA, MS, MEng, MEd, MSW, MBA)  Occupational History   Occupation: Archivist  Tobacco Use   Smoking status: Never   Smokeless tobacco: Never  Vaping Use   Vaping Use: Never used  Substance and Sexual Activity   Alcohol use: Yes    Alcohol/week: 0.0 standard drinks    Comment: occasional use 1 drink every couple weeks (wine or beer)   Drug use: No   Sexual activity: Not on file  Other Topics Concern   Not on file  Social History Narrative   Not on file   Social Determinants of Health   Financial Resource Strain: Low Risk    Difficulty of Paying Living Expenses: Not hard at all  Food Insecurity: No Food Insecurity   Worried About Charity fundraiser in the Last Year: Never true   [place] in the Last Year: Never true   Transportation Needs: No Transportation Needs   Lack of Transportation (Medical): No   Lack of Transportation (Non-Medical): No  Physical Activity: Inactive   Days of Exercise per Week: 0 days   Minutes of Exercise per Session: 0 min  Stress: Stress Concern Present   Feeling of Stress : Rather much  Social Connections: Moderately Integrated   Frequency of Communication with Friends and Family: More than three times a week   Frequency of Social Gatherings with Friends and Family: More than three times a week   Attends Religious Services: More than 4 times per year   Active Member of [employer] or Organizations: No   Attends Music therapist: Never   Marital Status: Married     Family History: The patient's family history includes Anemia in his mother; CAD in his father and mother; Cancer in his mother; Colon cancer in his mother; Congestive Heart Failure in his father; Emphysema in his father; Tuberculosis in his father.  ROS:   Please see the history of present illness.     All other systems reviewed and are negative.  EKGs/Labs/Other Studies Reviewed:    The following studies were reviewed today: TTE 10-06-2019 1. Left ventricular ejection fraction, by visual estimation, is 55 to 60%. The left ventricle has normal function. Normal left ventricular size. There is borderline left ventricular hypertrophy.  2. Left ventricular diastolic function could not be evaluated pattern of LV diastolic filling.  3. Global right ventricle has normal systolic function.The right ventricular size is normal. Right vetricular wall thickness was not assessed.  4. Left atrial size was normal.  5. Right atrial size was normal.  6. The mitral valve is normal in structure. Trace mitral valve regurgitation.  7. The tricuspid valve is normal in structure. Tricuspid valve regurgitation is mild.  8. The aortic valve is normal in structure. Aortic valve regurgitation was not visualized by color flow  Doppler.  9. The pulmonic valve was not well visualized. Pulmonic valve regurgitation is not visualized by color flow Doppler. 10. Mildly elevated pulmonary artery systolic pressure.  EKG:  EKG is  ordered today.  The ekg ordered today demonstrates atrial fibrillation, heart rate 77.  Recent Labs: No results found for requested labs within last 8760 hours.  Recent Lipid Panel    Component Value Date/Time   CHOL 149 03/24/2016 0901   TRIG 56 03/24/2016 0901   HDL 38 (L) 03/24/2016 0901   CHOLHDL 3.9 03/24/2016 0901   LDLCALC 100 (H) 03/24/2016 0901    Physical Exam:    VS:  BP (!) 122/94 (BP Location: Left Arm, Patient Position: Sitting, Cuff Size: Large)  Pulse 88   Ht 5\' 11"  (1.803 m)   Wt (!) 355 lb (161 kg)   SpO2 95%   BMI 49.51 kg/m     Wt Readings from Last 3 Encounters:  06/20/21 (!) 355 lb (161 kg)  01/26/21 (!) 360 lb (163.3 kg)  01/20/21 (!) 360 lb (163.3 kg)     GEN:  Well nourished, well developed in no acute distress, obese HEENT: Normal NECK: No JVD; No carotid bruits LYMPHATICS: No lymphadenopathy CARDIAC: Irregular irregular, distant heart sounds RESPIRATORY: Clear anteriorly, decreased at bases. ABDOMEN: Soft, non-tender, non-distended MUSCULOSKELETAL:  No edema; No deformity  SKIN: Warm and dry NEUROLOGIC:  Alert and oriented x 3 PSYCHIATRIC:  Normal affect   ASSESSMENT:    1. Persistent atrial fibrillation ([place])   2. Morbid obesity with BMI of 45.0-49.9, adult ([place])   3. Sleep apnea, unspecified type       PLAN:    In order of problems listed above:  persistent atrial fibrillation, last echocardiogram 09/2019 normal ejection fraction, EF 55 to 60%, normal LA size.  Clinically asymptomatic. CHA2DS2-VASc score of 1 .  Continue Lopressor 25 mg twice daily. Eliquis 5 mg twice daily.  We will plan for DC cardioversion in about 3 weeks.  If unsuccessful, will continue with a rate control strategy.  Obesity/untreated CPAP reduces chances of  cardioversion success. Morbid obesity, weight loss advised. Sleep apnea, management as per pulm.  CPAP mask compliance advised.   Follow-up 2 to 3 weeks after cardioversion.  Shared Decision Making/Informed Consent The risks (stroke, cardiac arrhythmias rarely resulting in the need for a temporary or permanent pacemaker, skin irritation or burns and complications associated with conscious sedation including aspiration, arrhythmia, respiratory failure and death), benefits (restoration of normal sinus rhythm) and alternatives of a direct current cardioversion were explained in detail to Charles Floyd and he agrees to proceed.     Medication Adjustments/Labs and Tests Ordered: Current medicines are reviewed at length with the patient today.  Concerns regarding medicines are outlined above.  Orders Placed This Encounter  Procedures   CBC   Basic metabolic panel   EKG [id number]    Meds ordered this encounter  Medications   metoprolol tartrate (LOPRESSOR) 25 MG tablet    Sig: Take 1 tablet (25 mg total) by mouth 2 (two) times daily.    Dispense:  180 tablet    Refill:  0     Patient Instructions  Medication Instructions:   Your physician recommends that you continue on your current medications as directed. Please refer to the Current Medication list given to you today.  *If you need a refill on your cardiac medications before your next appointment, please call your pharmacy*   Lab Work:  CBC, BMP drawn today  If you have labs (blood work) drawn today and your tests are completely normal, you will receive your results only by: [place] (if you have MyChart) OR A paper copy in the mail If you have any lab test that is abnormal or we need to change your treatment, we will call you to review the results.   Testing/Procedures:  You are scheduled for a Cardioversion on _Wednesday 8/10/22___with Dr.__Agbor-Etang_________ Please arrive at the [place] [facility] at  __0630_______ a.m. on the day of your procedure.  DIET INSTRUCTIONS:  Nothing to eat or drink after midnight except your medications with a sip of water.         Labs: _____Drawn at Office Visit_____________  Medications:  YOU  MAY TAKE ALL of your remaining medications with a small amount of water.  Must have a responsible person to drive you home.  Bring a current list of your medications and current insurance cards.    If you have any questions after you get home, please call the office at [phone]    Follow-Up: At [facility], you and your health needs are our priority.  As part of our continuing mission to provide you with exceptional heart care, we have created designated Provider Care Teams.  These Care Teams include your primary Cardiologist (physician) and Advanced Practice Providers (APPs -  Physician Assistants and Nurse Practitioners) who all work together to provide you with the care you need, when you need it.  We recommend signing up for the patient portal called "MyChart".  Sign up information is provided on this After Visit Summary.  MyChart is used to connect with patients for Virtual Visits (Telemedicine).  Patients are able to view lab/test results, encounter notes, upcoming appointments, etc.  Non-urgent messages can be sent to your provider as well.   To learn more about what you can do with MyChart, go to [email or web address].    Your next appointment:   6 week(s)  The format for your next appointment:   In Person  Provider:   Kate Sable, MD or an APP   Other Instructions    Signed, Kate Sable, MD  06/20/2021 3:24 PM    [place]

## 2021-06-20 NOTE — Progress Notes (Signed)
Cardiology Office Note:    Date:  06/20/2021   ID:  Charles Floyd, DOB 06-21-1951, MRN [id number]  PCP:  Margo Common, PA-C  Cardiologist:  Kate Sable, MD  Electrophysiologist:  None   Referring MD: Margo Common, PA-C   Chief Complaint  Patient presents with   Other    Past due follow up - Patient c.o SOB and that his Afib alert goes off on his watch about 2-3 times a day. Meds reviewed verbally with patient.    History of Present Illness:    Charles Floyd is a 70 y.o. male with a hx of OSA, persistent atrial fibrillation, obesity who presents for follow-up.    Patient is being seen due to persistent atrial fibrillation.  Tolerating Eliquis and Lopressor without any bleeding issues.  Denies palpitations.  had a cataract procedure about 6 weeks ago was required holding Eliquis.  Has been taking Eliquis as prescribed over the past month without interruption.  He endorses not using his CPAP frequently.  Supposed to follow-up with pulmonary medicine/sleep specialist for retest of sleep apnea and CPAP titration.    Prior notes echocardiogram 09/2019 showed normal systolic and diastolic function, EF 55 to 60%.    Past Medical History:  Diagnosis Date   Arthritis    BACK   Asthma    AS A CHILD-NO INHALERS AS AN ADULT   Atrial fibrillation ([place]) 08/2019   Cancer of prostate [facility]) 2012   Robotic Assisted prostatectomy 2012 [facility]   Gout    Gout    History of chicken pox    History of kidney stones    H/O   History of measles    History of mumps    Panic attacks    Panic attacks    Sleep apnea    using CPAP occasionally     Past Surgical History:  Procedure Laterality Date   CHOLECYSTECTOMY N/A 09/18/2017   Procedure: LAPAROSCOPIC CHOLECYSTECTOMY WITH INTRAOPERATIVE CHOLANGIOGRAM;  Surgeon: Robert Bellow, MD;  Location: [facility] ORS;  Service: General;  Laterality: N/A;   COLONOSCOPY  Jan 2003, 12-17-13   Dr Bary Castilla   COLONOSCOPY WITH  PROPOFOL N/A 01/26/2021   Procedure: COLONOSCOPY WITH PROPOFOL;  Surgeon: Robert Bellow, MD;  Location: [facility] ENDOSCOPY;  Service: Endoscopy;  Laterality: N/A;  COVID POSITIVE 01/03/2021   kidney stone extraction  07/2013   stent placed   PROSTATE SURGERY  2012   PROSTATECTOMY  05/16/2011   [facility]. Abdominal Laparoscopic, robotic assisted    Current Medications: Current Meds  Medication Sig   acetaminophen (TYLENOL) 650 MG CR tablet Take 1,300 mg by mouth every 8 (eight) hours as needed for pain.   allopurinol (ZYLOPRIM) 300 MG tablet TAKE 1 TABLET (300 MG TOTAL) BY MOUTH DAILY. AS NEEDED FOR GOUT   apixaban (ELIQUIS) 5 MG TABS tablet Take 1 tablet (5 mg total) by mouth 2 (two) times daily.   doxycycline (VIBRAMYCIN) 100 MG capsule Take 100 mg by mouth 2 (two) times daily.   [DISCONTINUED] metoprolol tartrate (LOPRESSOR) 25 MG tablet Take 1 tablet (25 mg total) by mouth 2 (two) times daily.     Allergies:   Patient has no known allergies.   Social History   Socioeconomic History   Marital status: Married    Spouse name: Not on file   Number of children: 2   Years of education: Not on file   Highest education level: Master's degree (e.g., MA, MS, MEng, MEd, MSW, MBA)  Occupational History   Occupation: Archivist  Tobacco Use   Smoking status: Never   Smokeless tobacco: Never  Vaping Use   Vaping Use: Never used  Substance and Sexual Activity   Alcohol use: Yes    Alcohol/week: 0.0 standard drinks    Comment: occasional use 1 drink every couple weeks (wine or beer)   Drug use: No   Sexual activity: Not on file  Other Topics Concern   Not on file  Social History Narrative   Not on file   Social Determinants of Health   Financial Resource Strain: Low Risk    Difficulty of Paying Living Expenses: Not hard at all  Food Insecurity: No Food Insecurity   Worried About Charity fundraiser in the Last Year: Never true   [place] in the Last Year: Never true   Transportation Needs: No Transportation Needs   Lack of Transportation (Medical): No   Lack of Transportation (Non-Medical): No  Physical Activity: Inactive   Days of Exercise per Week: 0 days   Minutes of Exercise per Session: 0 min  Stress: Stress Concern Present   Feeling of Stress : Rather much  Social Connections: Moderately Integrated   Frequency of Communication with Friends and Family: More than three times a week   Frequency of Social Gatherings with Friends and Family: More than three times a week   Attends Religious Services: More than 4 times per year   Active Member of [employer] or Organizations: No   Attends Music therapist: Never   Marital Status: Married     Family History: The patient's family history includes Anemia in his mother; CAD in his father and mother; Cancer in his mother; Colon cancer in his mother; Congestive Heart Failure in his father; Emphysema in his father; Tuberculosis in his father.  ROS:   Please see the history of present illness.     All other systems reviewed and are negative.  EKGs/Labs/Other Studies Reviewed:    The following studies were reviewed today: TTE Oct 15, 2019 1. Left ventricular ejection fraction, by visual estimation, is 55 to 60%. The left ventricle has normal function. Normal left ventricular size. There is borderline left ventricular hypertrophy.  2. Left ventricular diastolic function could not be evaluated pattern of LV diastolic filling.  3. Global right ventricle has normal systolic function.The right ventricular size is normal. Right vetricular wall thickness was not assessed.  4. Left atrial size was normal.  5. Right atrial size was normal.  6. The mitral valve is normal in structure. Trace mitral valve regurgitation.  7. The tricuspid valve is normal in structure. Tricuspid valve regurgitation is mild.  8. The aortic valve is normal in structure. Aortic valve regurgitation was not visualized by color flow  Doppler.  9. The pulmonic valve was not well visualized. Pulmonic valve regurgitation is not visualized by color flow Doppler. 10. Mildly elevated pulmonary artery systolic pressure.  EKG:  EKG is  ordered today.  The ekg ordered today demonstrates atrial fibrillation, heart rate 77.  Recent Labs: No results found for requested labs within last 8760 hours.  Recent Lipid Panel    Component Value Date/Time   CHOL 149 03/24/2016 0901   TRIG 56 03/24/2016 0901   HDL 38 (L) 03/24/2016 0901   CHOLHDL 3.9 03/24/2016 0901   LDLCALC 100 (H) 03/24/2016 0901    Physical Exam:    VS:  BP (!) 122/94 (BP Location: Left Arm, Patient Position: Sitting, Cuff Size: Large)  Pulse 88   Ht 5\' 11"  (1.803 m)   Wt (!) 355 lb (161 kg)   SpO2 95%   BMI 49.51 kg/m     Wt Readings from Last 3 Encounters:  06/20/21 (!) 355 lb (161 kg)  01/26/21 (!) 360 lb (163.3 kg)  01/20/21 (!) 360 lb (163.3 kg)     GEN:  Well nourished, well developed in no acute distress, obese HEENT: Normal NECK: No JVD; No carotid bruits LYMPHATICS: No lymphadenopathy CARDIAC: Irregular irregular, distant heart sounds RESPIRATORY: Clear anteriorly, decreased at bases. ABDOMEN: Soft, non-tender, non-distended MUSCULOSKELETAL:  No edema; No deformity  SKIN: Warm and dry NEUROLOGIC:  Alert and oriented x 3 PSYCHIATRIC:  Normal affect   ASSESSMENT:    1. Persistent atrial fibrillation ([place])   2. Morbid obesity with BMI of 45.0-49.9, adult ([place])   3. Sleep apnea, unspecified type       PLAN:    In order of problems listed above:  persistent atrial fibrillation, last echocardiogram 09/2019 normal ejection fraction, EF 55 to 60%, normal LA size.  Clinically asymptomatic. CHA2DS2-VASc score of 1 .  Continue Lopressor 25 mg twice daily. Eliquis 5 mg twice daily.  We will plan for DC cardioversion in about 3 weeks.  If unsuccessful, will continue with a rate control strategy.  Obesity/untreated CPAP reduces chances of  cardioversion success. Morbid obesity, weight loss advised. Sleep apnea, management as per pulm.  CPAP mask compliance advised.   Follow-up 2 to 3 weeks after cardioversion.  Shared Decision Making/Informed Consent The risks (stroke, cardiac arrhythmias rarely resulting in the need for a temporary or permanent pacemaker, skin irritation or burns and complications associated with conscious sedation including aspiration, arrhythmia, respiratory failure and death), benefits (restoration of normal sinus rhythm) and alternatives of a direct current cardioversion were explained in detail to Mr. Doell and he agrees to proceed.     Medication Adjustments/Labs and Tests Ordered: Current medicines are reviewed at length with the patient today.  Concerns regarding medicines are outlined above.  Orders Placed This Encounter  Procedures   CBC   Basic metabolic panel   EKG [id number]    Meds ordered this encounter  Medications   metoprolol tartrate (LOPRESSOR) 25 MG tablet    Sig: Take 1 tablet (25 mg total) by mouth 2 (two) times daily.    Dispense:  180 tablet    Refill:  0     Patient Instructions  Medication Instructions:   Your physician recommends that you continue on your current medications as directed. Please refer to the Current Medication list given to you today.  *If you need a refill on your cardiac medications before your next appointment, please call your pharmacy*   Lab Work:  CBC, BMP drawn today  If you have labs (blood work) drawn today and your tests are completely normal, you will receive your results only by: [place] (if you have MyChart) OR A paper copy in the mail If you have any lab test that is abnormal or we need to change your treatment, we will call you to review the results.   Testing/Procedures:  You are scheduled for a Cardioversion on _Wednesday 8/10/22___with Dr.__Agbor-Etang_________ Please arrive at the [place] [facility] at  __0630_______ a.m. on the day of your procedure.  DIET INSTRUCTIONS:  Nothing to eat or drink after midnight except your medications with a sip of water.         Labs: _____Drawn at Office Visit_____________  Medications:  YOU  MAY TAKE ALL of your remaining medications with a small amount of water.  Must have a responsible person to drive you home.  Bring a current list of your medications and current insurance cards.    If you have any questions after you get home, please call the office at [phone]    Follow-Up: At [facility], you and your health needs are our priority.  As part of our continuing mission to provide you with exceptional heart care, we have created designated Provider Care Teams.  These Care Teams include your primary Cardiologist (physician) and Advanced Practice Providers (APPs -  Physician Assistants and Nurse Practitioners) who all work together to provide you with the care you need, when you need it.  We recommend signing up for the patient portal called "MyChart".  Sign up information is provided on this After Visit Summary.  MyChart is used to connect with patients for Virtual Visits (Telemedicine).  Patients are able to view lab/test results, encounter notes, upcoming appointments, etc.  Non-urgent messages can be sent to your provider as well.   To learn more about what you can do with MyChart, go to [email or web address].    Your next appointment:   6 week(s)  The format for your next appointment:   In Person  Provider:   Kate Sable, MD or an APP   Other Instructions    Signed, Kate Sable, MD  06/20/2021 3:24 PM    [place]

## 2021-06-20 NOTE — Patient Instructions (Signed)
Medication Instructions:   Your physician recommends that you continue on your current medications as directed. Please refer to the Current Medication list given to you today.  *If you need a refill on your cardiac medications before your next appointment, please call your pharmacy*   Lab Work:  CBC, BMP drawn today  If you have labs (blood work) drawn today and your tests are completely normal, you will receive your results only by: [place] (if you have MyChart) OR A paper copy in the mail If you have any lab test that is abnormal or we need to change your treatment, we will call you to review the results.   Testing/Procedures:  You are scheduled for a Cardioversion on _Wednesday 8/10/22___with Dr.__Agbor-Etang_________ Please arrive at the [place] [facility] at __0630_______ a.m. on the day of your procedure.  DIET INSTRUCTIONS:  Nothing to eat or drink after midnight except your medications with a sip of water.         Labs: _____Drawn at Office Visit_____________  Medications:  YOU MAY TAKE ALL of your remaining medications with a small amount of water.  Must have a responsible person to drive you home.  Bring a current list of your medications and current insurance cards.    If you have any questions after you get home, please call the office at [phone]    Follow-Up: At [facility], you and your health needs are our priority.  As part of our continuing mission to provide you with exceptional heart care, we have created designated Provider Care Teams.  These Care Teams include your primary Cardiologist (physician) and Advanced Practice Providers (APPs -  Physician Assistants and Nurse Practitioners) who all work together to provide you with the care you need, when you need it.  We recommend signing up for the patient portal called "MyChart".  Sign up information is provided on this After Visit Summary.  MyChart is used to connect with patients for Virtual  Visits (Telemedicine).  Patients are able to view lab/test results, encounter notes, upcoming appointments, etc.  Non-urgent messages can be sent to your provider as well.   To learn more about what you can do with MyChart, go to [email or web address].    Your next appointment:   6 week(s)  The format for your next appointment:   In Person  Provider:   Kate Sable, MD or an APP   Other Instructions

## 2021-06-21 LAB — CBC
Hematocrit: 46.4 % (ref 37.5–51.0)
Hemoglobin: 15.4 g/dL (ref 13.0–17.7)
MCH: 30 pg (ref 26.6–33.0)
MCHC: 33.2 g/dL (ref 31.5–35.7)
MCV: 90 fL (ref 79–97)
Platelets: 283 10*3/uL (ref 150–450)
RBC: 5.13 x10E6/uL (ref 4.14–5.80)
RDW: 12.9 % (ref 11.6–15.4)
WBC: 15.6 10*3/uL — ABNORMAL HIGH (ref 3.4–10.8)

## 2021-06-21 LAB — BASIC METABOLIC PANEL
BUN/Creatinine Ratio: 19 (ref 10–24)
BUN: 18 mg/dL (ref 8–27)
CO2: 28 mmol/L (ref 20–29)
Calcium: 9.3 mg/dL (ref 8.6–10.2)
Chloride: 100 mmol/L (ref 96–106)
Creatinine, Ser: 0.94 mg/dL (ref 0.76–1.27)
Glucose: 95 mg/dL (ref 65–99)
Potassium: 4.7 mmol/L (ref 3.5–5.2)
Sodium: 142 mmol/L (ref 134–144)
eGFR: 87 mL/min/{1.73_m2} (ref >59)

## 2021-06-23 ENCOUNTER — Ambulatory Visit: Payer: Medicare Other | Admitting: Family Medicine

## 2021-06-27 ENCOUNTER — Other Ambulatory Visit: Payer: Self-pay | Admitting: *Deleted

## 2021-06-27 MED ORDER — APIXABAN 5 MG PO TABS: 5.0000 mg | ORAL_TABLET | Freq: Two times a day (BID) | ORAL | 1 refills | Status: DC

## 2021-06-27 NOTE — Telephone Encounter (Signed)
Prescription refill request for Eliquis received. Indication: Atrial fib Last office visit: 06/20/21 Agbor-Etang MD Scr: 0.94 on 06/20/21 Age:  70 Weight: 161kg  Based on above findings Eliquis '5mg'$  twice daily is the appropriate dose.  Refill approved.

## 2021-06-30 DIAGNOSIS — D039 Melanoma in situ, unspecified: Secondary | ICD-10-CM | POA: Diagnosis not present

## 2021-06-30 DIAGNOSIS — D0361 Melanoma in situ of right upper limb, including shoulder: Secondary | ICD-10-CM | POA: Diagnosis not present

## 2021-07-07 ENCOUNTER — Ambulatory Visit: Payer: Medicare Other | Admitting: Family Medicine

## 2021-07-13 ENCOUNTER — Ambulatory Visit
Admission: RE | Admit: 2021-07-13 | Discharge: 2021-07-13 | Disposition: A | Discharge status: Home / Self Care | Payer: Medicare Other | Attending: Cardiology | Admitting: Cardiology

## 2021-07-13 ENCOUNTER — Encounter: Payer: Self-pay | Admitting: Cardiology

## 2021-07-13 ENCOUNTER — Encounter
Admission: RE | Disposition: A | Discharge status: Home / Self Care | Payer: Self-pay | Source: Home / Self Care | Attending: Cardiology

## 2021-07-13 ENCOUNTER — Ambulatory Visit: Payer: Medicare Other | Admitting: Certified Registered"

## 2021-07-13 DIAGNOSIS — Z8546 Personal history of malignant neoplasm of prostate: Secondary | ICD-10-CM | POA: Insufficient documentation

## 2021-07-13 DIAGNOSIS — Z79899 Other long term (current) drug therapy: Secondary | ICD-10-CM | POA: Diagnosis not present

## 2021-07-13 DIAGNOSIS — G4733 Obstructive sleep apnea (adult) (pediatric): Secondary | ICD-10-CM | POA: Insufficient documentation

## 2021-07-13 DIAGNOSIS — Z7901 Long term (current) use of anticoagulants: Secondary | ICD-10-CM | POA: Insufficient documentation

## 2021-07-13 DIAGNOSIS — I4819 Other persistent atrial fibrillation: Secondary | ICD-10-CM | POA: Insufficient documentation

## 2021-07-13 DIAGNOSIS — Z6841 Body Mass Index (BMI) 40.0 and over, adult: Secondary | ICD-10-CM | POA: Insufficient documentation

## 2021-07-13 DIAGNOSIS — I4891 Unspecified atrial fibrillation: Secondary | ICD-10-CM | POA: Diagnosis not present

## 2021-07-13 HISTORY — PX: CARDIOVERSION: SHX1299

## 2021-07-13 SURGERY — CARDIOVERSION
Anesthesia: General

## 2021-07-13 MED ORDER — SODIUM CHLORIDE 0.9 % IV SOLN
Freq: Once | INTRAVENOUS | Status: AC
  Administered 2021-07-13: 1000 mL via INTRAVENOUS

## 2021-07-13 MED ORDER — PHENYLEPHRINE HCL (PRESSORS) 10 MG/ML IV SOLN
INTRAVENOUS | Status: AC
  Filled 2021-07-13: qty 1

## 2021-07-13 MED ORDER — PROPOFOL 10 MG/ML IV BOLUS
INTRAVENOUS | Status: DC | PRN
  Administered 2021-07-13: 30 mg via INTRAVENOUS

## 2021-07-13 MED ORDER — PROPOFOL 10 MG/ML IV BOLUS
INTRAVENOUS | Status: AC
  Filled 2021-07-13: qty 20

## 2021-07-13 MED ORDER — SODIUM CHLORIDE 0.9 % IV SOLN: INTRAVENOUS | Status: DC | PRN

## 2021-07-13 NOTE — Procedures (Signed)
Cardioversion procedure note ?For atrial fibrillation. ? ?Procedure Details: ? ?Consent: Risks of procedure as well as the alternatives and risks of each were explained to the (patient/caregiver).  Consent for procedure obtained. ? ?Time Out: Verified patient identification, verified procedure, site/side was marked, verified correct patient position, special equipment/implants available, medications/allergies/relevent history reviewed, required imaging and test results available.  Performed ? ?Patient placed on cardiac monitor, pulse oximetry, supplemental oxygen as necessary.   ?Sedation given: propofol IV per anesthesia team  ?Pacer pads placed anterior and posterior chest. ? ? ?Cardioverted 1 time(s).   ?Cardioverted at  200J. Synchronized biphasic ?Converted to NSR ? ? ?Evaluation: ?Findings: Post procedure EKG shows: NSR ?Complications: None ?Patient did tolerate procedure well. ? ?Time Spent Directly with the Patient: ? ?35 minutes  ? ?Derika Eckles Agbor-Etang, M.D.  ?

## 2021-07-13 NOTE — Interval H&P Note (Signed)
History and Physical Interval Note:  07/13/2021 7:41 AM  Melton Alar  has presented today for surgery, with the diagnosis of  AFib.  The various methods of treatment have been discussed with the patient and family. After consideration of risks, benefits and other options for treatment, the patient has consented to  Procedure(s): CARDIOVERSION (N/A) as a surgical intervention.  The patient's history has been reviewed, patient examined, no change in status, stable for surgery.  I have reviewed the patient's chart and labs.  Questions were answered to the patient's satisfaction.     Aaron Edelman Agbor-Etang

## 2021-07-13 NOTE — Anesthesia Postprocedure Evaluation (Signed)
Anesthesia Post Note  Patient: Charles Floyd  Procedure(s) Performed: CARDIOVERSION  Patient location during evaluation: PACU Anesthesia Type: General Level of consciousness: awake and alert Pain management: pain level controlled Vital Signs Assessment: post-procedure vital signs reviewed and stable Respiratory status: spontaneous breathing, nonlabored ventilation, respiratory function stable and patient connected to nasal cannula oxygen Cardiovascular status: blood pressure returned to baseline and stable Postop Assessment: no apparent nausea or vomiting Anesthetic complications: no   No notable events documented.   Last Vitals:  Vitals:   07/13/21 0811 07/13/21 0830  BP: 103/61 114/66  Pulse: 82 85  Resp: 16 20  Temp:    SpO2: 95% 93%    Last Pain:  Vitals:   07/13/21 0830  TempSrc:   PainSc: 0-No pain                 Molli Barrows

## 2021-07-13 NOTE — Transfer of Care (Signed)
Immediate Anesthesia Transfer of Care Note  Patient: Charles Floyd  Procedure(s) Performed: CARDIOVERSION  Patient Location: PACU and Cath Lab  Anesthesia Type:General  Level of Consciousness: drowsy  Airway & Oxygen Therapy: Patient Spontanous Breathing and Patient connected to face mask oxygen  Post-op Assessment: Report given to RN  Post vital signs: stable  Last Vitals:  Vitals Value Taken Time  BP 103/71 07/13/21 0740  Temp    Pulse 72 07/13/21 0741  Resp 21 07/13/21 0741  SpO2 98 % 07/13/21 0741    Last Pain:  Vitals:   07/13/21 0707  TempSrc: Oral  PainSc: 7          Complications: No notable events documented.

## 2021-07-13 NOTE — Anesthesia Preprocedure Evaluation (Signed)
Anesthesia Evaluation  Patient identified by MRN, date of birth, ID band Patient awake    Reviewed: Allergy & Precautions, H&P , NPO status , Patient's Chart, lab work & pertinent test results, reviewed documented beta blocker date and time   Airway Mallampati: III   Neck ROM: full    Dental  (+) Poor Dentition, Teeth Intact   Pulmonary asthma , sleep apnea ,    Pulmonary exam normal        Cardiovascular Exercise Tolerance: Poor negative cardio ROS  Atrial Fibrillation  Rhythm:regular Rate:Normal     Neuro/Psych Anxiety negative neurological ROS  negative psych ROS   GI/Hepatic negative GI ROS, Neg liver ROS,   Endo/Other  Morbid obesity  Renal/GU negative Renal ROS  negative genitourinary   Musculoskeletal   Abdominal   Peds  Hematology negative hematology ROS (+)   Anesthesia Other Findings Past Medical History: No date: Arthritis     Comment:  BACK No date: Asthma     Comment:  AS A CHILD-NO INHALERS AS AN ADULT 08/2019: Atrial fibrillation ([place]) 2012: Cancer of prostate ([place])     Comment:  Robotic Assisted prostatectomy 2012 [facility] No date: Gout No date: Gout No date: History of chicken pox No date: History of kidney stones     Comment:  H/O No date: History of measles No date: History of mumps No date: Panic attacks No date: Panic attacks No date: Sleep apnea     Comment:  using CPAP occasionally  Past Surgical History: 09/18/2017: CHOLECYSTECTOMY; N/A     Comment:  Procedure: LAPAROSCOPIC CHOLECYSTECTOMY WITH               INTRAOPERATIVE CHOLANGIOGRAM;  Surgeon: Robert Bellow, MD;  Location: [facility] ORS;  Service: General;                Laterality: N/A; Jan 2003, 12-17-13: COLONOSCOPY     Comment:  Dr Bary Castilla 01/26/2021: COLONOSCOPY WITH PROPOFOL; N/A     Comment:  Procedure: COLONOSCOPY WITH PROPOFOL;  Surgeon: Robert Bellow, MD;  Location: [facility] ENDOSCOPY;   Service:               Endoscopy;  Laterality: N/A;  COVID POSITIVE 01/03/2021 07/2013: kidney stone extraction     Comment:  stent placed 2012: PROSTATE SURGERY 05/16/2011: PROSTATECTOMY     Comment:  [facility]. Abdominal Laparoscopic, robotic assisted BMI    Body Mass Index: 49.51 kg/m     Reproductive/Obstetrics negative OB ROS                             Anesthesia Physical Anesthesia Plan  ASA: 4  Anesthesia Plan: General   Post-op Pain Management:    Induction:   PONV Risk Score and Plan:   Airway Management Planned:   Additional Equipment:   Intra-op Plan:   Post-operative Plan:   Informed Consent: I have reviewed the patients History and Physical, chart, labs and discussed the procedure including the risks, benefits and alternatives for the proposed anesthesia with the patient or authorized representative who has indicated his/her understanding and acceptance.     Dental Advisory Given  Plan Discussed with: CRNA  Anesthesia Plan Comments:         Anesthesia Quick Evaluation

## 2021-08-02 DIAGNOSIS — G8929 Other chronic pain: Secondary | ICD-10-CM | POA: Diagnosis not present

## 2021-08-02 DIAGNOSIS — M5442 Lumbago with sciatica, left side: Secondary | ICD-10-CM | POA: Diagnosis not present

## 2021-08-02 DIAGNOSIS — M48062 Spinal stenosis, lumbar region with neurogenic claudication: Secondary | ICD-10-CM | POA: Diagnosis not present

## 2021-08-04 ENCOUNTER — Other Ambulatory Visit: Payer: Self-pay

## 2021-08-04 ENCOUNTER — Ambulatory Visit (INDEPENDENT_AMBULATORY_CARE_PROVIDER_SITE_OTHER): Payer: Medicare Other | Admitting: Cardiology

## 2021-08-04 ENCOUNTER — Encounter: Payer: Self-pay | Admitting: Cardiology

## 2021-08-04 VITALS — BP 100/70 | HR 78 | Ht 71.0 in | Wt 357.0 lb

## 2021-08-04 DIAGNOSIS — G4730 Sleep apnea, unspecified: Secondary | ICD-10-CM | POA: Diagnosis not present

## 2021-08-04 DIAGNOSIS — I4819 Other persistent atrial fibrillation: Secondary | ICD-10-CM

## 2021-08-04 DIAGNOSIS — Z6841 Body Mass Index (BMI) 40.0 and over, adult: Secondary | ICD-10-CM | POA: Diagnosis not present

## 2021-08-04 MED ORDER — METOPROLOL SUCCINATE ER 25 MG PO TB24: 25.0000 mg | ORAL_TABLET | Freq: Every day | ORAL | 6 refills | Status: DC

## 2021-08-04 NOTE — Patient Instructions (Signed)
Medication Instructions:   Your physician has recommended you make the following change in your medication:    STOP taking your Lopressor.  2.   START taking Toprol XL (Metoprolol Succinate) 25 MG once a day.  *If you need a refill on your cardiac medications before your next appointment, please call your pharmacy*   Lab Work: None ordered If you have labs (blood work) drawn today and your tests are completely normal, you will receive your results only by: [place] (if you have MyChart) OR A paper copy in the mail If you have any lab test that is abnormal or we need to change your treatment, we will call you to review the results.   Testing/Procedures: None ordered   Follow-Up: At [facility], you and your health needs are our priority.  As part of our continuing mission to provide you with exceptional heart care, we have created designated Provider Care Teams.  These Care Teams include your primary Cardiologist (physician) and Advanced Practice Providers (APPs -  Physician Assistants and Nurse Practitioners) who all work together to provide you with the care you need, when you need it.  We recommend signing up for the patient portal called "MyChart".  Sign up information is provided on this After Visit Summary.  MyChart is used to connect with patients for Virtual Visits (Telemedicine).  Patients are able to view lab/test results, encounter notes, upcoming appointments, etc.  Non-urgent messages can be sent to your provider as well.   To learn more about what you can do with MyChart, go to [email or web address].    Your next appointment:   6 month(s)  The format for your next appointment:   In Person  Provider:   Kate Sable, MD   Other Instructions

## 2021-08-04 NOTE — Progress Notes (Signed)
Cardiology Office Note:    Date:  08/04/2021   ID:  Charles Floyd, DOB January 12, 1951, MRN [id number]  PCP:  Margo Common, PA-C  Cardiologist:  Kate Sable, MD  Electrophysiologist:  None   Referring MD: Margo Common, PA-C   Chief Complaint  Patient presents with   Follow-up    6 Week follow up and post cardioversion. Medications verbally reviewed with patient.     History of Present Illness:    Charles Floyd is a 70 y.o. male with a hx of OSA, persistent atrial fibrillation, obesity who presents for follow-up.    Patient underwent DC cardioversion on 07/15/2021 successfully.  Compliance with CPAP mask advised, he is getting better with using CPAP mask.  He felt okay after cardioversion, but states his wife told him he went back into A. fib about a week ago.  Has no bleeding issues with Eliquis.  Denies palpitations or dizziness.  States having lack of energy with his blood pressures on the low normal side.  Systolic in the 0000000 have been reported at home.   Prior notes echocardiogram 09/2019 showed normal systolic and diastolic function, EF 55 to 60%. DC cardioversion successful 07/13/2021 with conversion to normal sinus rhythm  Past Medical History:  Diagnosis Date   Arthritis    BACK   Asthma    AS A CHILD-NO INHALERS AS AN ADULT   Atrial fibrillation ([place]) 08/2019   Cancer of prostate [facility]) 2012   Robotic Assisted prostatectomy 2012 [facility]   Gout    Gout    History of chicken pox    History of kidney stones    H/O   History of measles    History of mumps    Panic attacks    Panic attacks    Sleep apnea    using CPAP occasionally     Past Surgical History:  Procedure Laterality Date   CARDIOVERSION N/A 07/13/2021   Procedure: CARDIOVERSION;  Surgeon: Kate Sable, MD;  Location: [facility] ORS;  Service: Cardiovascular;  Laterality: N/A;   CHOLECYSTECTOMY N/A 09/18/2017   Procedure: LAPAROSCOPIC CHOLECYSTECTOMY WITH INTRAOPERATIVE CHOLANGIOGRAM;   Surgeon: Robert Bellow, MD;  Location: [facility] ORS;  Service: General;  Laterality: N/A;   COLONOSCOPY  Jan 2003, 12-17-13   Dr Bary Castilla   COLONOSCOPY WITH PROPOFOL N/A 01/26/2021   Procedure: COLONOSCOPY WITH PROPOFOL;  Surgeon: Robert Bellow, MD;  Location: [place] ENDOSCOPY;  Service: Endoscopy;  Laterality: N/A;  COVID POSITIVE 01/03/2021   kidney stone extraction  07/2013   stent placed   PROSTATE SURGERY  2012   PROSTATECTOMY  05/16/2011   [facility]. Abdominal Laparoscopic, robotic assisted    Current Medications: Current Meds  Medication Sig   acetaminophen (TYLENOL) 650 MG CR tablet Take 1,300 mg by mouth every 8 (eight) hours as needed for pain.   allopurinol (ZYLOPRIM) 300 MG tablet TAKE 1 TABLET (300 MG TOTAL) BY MOUTH DAILY. AS NEEDED FOR GOUT   apixaban (ELIQUIS) 5 MG TABS tablet Take 1 tablet (5 mg total) by mouth 2 (two) times daily.   metoprolol succinate (TOPROL XL) 25 MG 24 hr tablet Take 1 tablet (25 mg total) by mouth daily.   [DISCONTINUED] metoprolol tartrate (LOPRESSOR) 25 MG tablet Take 1 tablet (25 mg total) by mouth 2 (two) times daily.     Allergies:   Patient has no known allergies.   Social History   Socioeconomic History   Marital status: Married    Spouse name: Not on file  Number of children: 2   Years of education: Not on file   Highest education level: Master's degree (e.g., MA, MS, MEng, MEd, MSW, MBA)  Occupational History   Occupation: Archivist  Tobacco Use   Smoking status: Never   Smokeless tobacco: Never  Vaping Use   Vaping Use: Never used  Substance and Sexual Activity   Alcohol use: Yes    Alcohol/week: 0.0 standard drinks    Comment: occasional use 1 drink every couple weeks (wine or beer)   Drug use: No   Sexual activity: Not on file  Other Topics Concern   Not on file  Social History Narrative   Not on file   Social Determinants of Health   Financial Resource Strain: Low Risk    Difficulty of Paying Living Expenses: Not  hard at all  Food Insecurity: No Food Insecurity   Worried About Charity fundraiser in the Last Year: Never true   [place] in the Last Year: Never true  Transportation Needs: No Transportation Needs   Lack of Transportation (Medical): No   Lack of Transportation (Non-Medical): No  Physical Activity: Inactive   Days of Exercise per Week: 0 days   Minutes of Exercise per Session: 0 min  Stress: Stress Concern Present   Feeling of Stress : Rather much  Social Connections: Moderately Integrated   Frequency of Communication with Friends and Family: More than three times a week   Frequency of Social Gatherings with Friends and Family: More than three times a week   Attends Religious Services: More than 4 times per year   Active Member of [employer] or Organizations: No   Attends Music therapist: Never   Marital Status: Married     Family History: The patient's family history includes Anemia in his mother; CAD in his father and mother; Cancer in his mother; Colon cancer in his mother; Congestive Heart Failure in his father; Emphysema in his father; Tuberculosis in his father.  ROS:   Please see the history of present illness.     All other systems reviewed and are negative.  EKGs/Labs/Other Studies Reviewed:     EKG:  EKG is  ordered today.  The ekg ordered today demonstrates atrial fibrillation, heart rate 78.  Recent Labs: 06/20/2021: BUN 18; Creatinine, Ser 0.94; Hemoglobin 15.4; Platelets 283; Potassium 4.7; Sodium 142  Recent Lipid Panel    Component Value Date/Time   CHOL 149 03/24/2016 0901   TRIG 56 03/24/2016 0901   HDL 38 (L) 03/24/2016 0901   CHOLHDL 3.9 03/24/2016 0901   LDLCALC 100 (H) 03/24/2016 0901    Physical Exam:    VS:  BP 100/70 (BP Location: Right Arm, Patient Position: Sitting, Cuff Size: Large)   Pulse 78   Ht '5\' 11"'$  (1.803 m)   Wt (!) 357 lb (161.9 kg)   SpO2 94%   BMI 49.79 kg/m     Wt Readings from Last 3 Encounters:   08/04/21 (!) 357 lb (161.9 kg)  07/13/21 (!) 355 lb (161 kg)  06/20/21 (!) 355 lb (161 kg)     GEN:  Well nourished, well developed in no acute distress, obese HEENT: Normal NECK: No JVD; No carotid bruits LYMPHATICS: No lymphadenopathy CARDIAC: Irregular irregular, distant heart sounds RESPIRATORY: Clear anteriorly, decreased at bases. ABDOMEN: Soft, non-tender, non-distended MUSCULOSKELETAL:  No edema; No deformity  SKIN: Warm and dry NEUROLOGIC:  Alert and oriented x 3 PSYCHIATRIC:  Normal affect   ASSESSMENT:  1. Persistent atrial fibrillation ([place])   2. Morbid obesity with BMI of 45.0-49.9, adult ([place])   3. Sleep apnea, unspecified type     PLAN:    In order of problems listed above:  persistent atrial fibrillation, s/p DCCV 07/2021, EKG today showing A. fib.  Last echocardiogram 09/2019 normal ejection fraction, EF 55 to 60%, normal LA size.  Clinically asymptomatic. CHA2DS2-VASc score of 1 .  Reduce beta-blocker dosage to help with low blood pressure.  Stop Lopressor, start Toprol-XL 25 mg daily.  Continue Eliquis 5 mg twice daily.   Morbid obesity, OSA contributing to unsuccessful DC cardioversion. Morbid obesity, weight loss advised. Sleep apnea, CPAP mask compliance advised.  Follow-up 6 months     Medication Adjustments/Labs and Tests Ordered: Current medicines are reviewed at length with the patient today.  Concerns regarding medicines are outlined above.  Orders Placed This Encounter  Procedures   EKG 12-Lead     Meds ordered this encounter  Medications   metoprolol succinate (TOPROL XL) 25 MG 24 hr tablet    Sig: Take 1 tablet (25 mg total) by mouth daily.    Dispense:  30 tablet    Refill:  6      Patient Instructions  Medication Instructions:   Your physician has recommended you make the following change in your medication:    STOP taking your Lopressor.  2.   START taking Toprol XL (Metoprolol Succinate) 25 MG once a day.  *If you  need a refill on your cardiac medications before your next appointment, please call your pharmacy*   Lab Work: None ordered If you have labs (blood work) drawn today and your tests are completely normal, you will receive your results only by: [place] (if you have MyChart) OR A paper copy in the mail If you have any lab test that is abnormal or we need to change your treatment, we will call you to review the results.   Testing/Procedures: None ordered   Follow-Up: At [facility], you and your health needs are our priority.  As part of our continuing mission to provide you with exceptional heart care, we have created designated Provider Care Teams.  These Care Teams include your primary Cardiologist (physician) and Advanced Practice Providers (APPs -  Physician Assistants and Nurse Practitioners) who all work together to provide you with the care you need, when you need it.  We recommend signing up for the patient portal called "MyChart".  Sign up information is provided on this After Visit Summary.  MyChart is used to connect with patients for Virtual Visits (Telemedicine).  Patients are able to view lab/test results, encounter notes, upcoming appointments, etc.  Non-urgent messages can be sent to your provider as well.   To learn more about what you can do with MyChart, go to [email or web address].    Your next appointment:   6 month(s)  The format for your next appointment:   In Person  Provider:   Kate Sable, MD   Other Instructions    Signed, Kate Sable, MD  08/04/2021 11:05 AM    [place]

## 2021-08-18 DIAGNOSIS — M48062 Spinal stenosis, lumbar region with neurogenic claudication: Secondary | ICD-10-CM | POA: Diagnosis not present

## 2021-09-06 DIAGNOSIS — M5442 Lumbago with sciatica, left side: Secondary | ICD-10-CM | POA: Diagnosis not present

## 2021-09-06 DIAGNOSIS — M48062 Spinal stenosis, lumbar region with neurogenic claudication: Secondary | ICD-10-CM | POA: Diagnosis not present

## 2021-09-06 DIAGNOSIS — G8929 Other chronic pain: Secondary | ICD-10-CM | POA: Diagnosis not present

## 2021-09-19 ENCOUNTER — Other Ambulatory Visit: Payer: Self-pay

## 2021-11-08 IMAGING — MR MR LUMBAR SPINE W/O CM
5 series · 31 of 48 positions shown · non-contrast
Comparison: Plain film April 12, 2016

CLINICAL DATA: Chronic left-sided back pain with left leg pain.

EXAM:
MRI LUMBAR SPINE WITHOUT CONTRAST
TECHNIQUE: Multiplanar, multisequence MR imaging of the lumbar spine was
performed. No intravenous contrast was administered.

[Series 5: T2 · sagittal · 4.0mm · 0.81mm/px · 6 of 17 slices shown (1 of 2)]
[im 1/17]
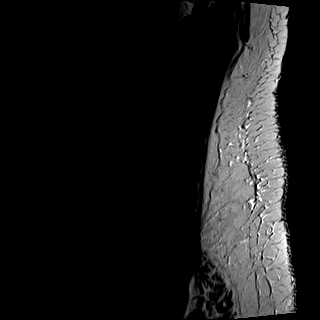
[im 4/17]
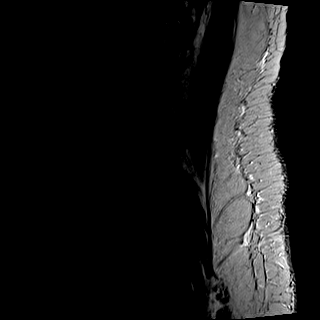
[im 7/17]
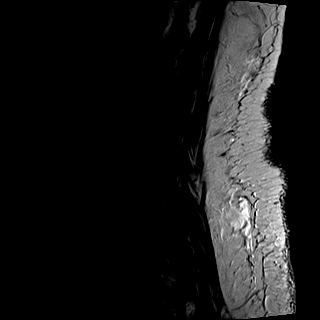
[im 10/17]
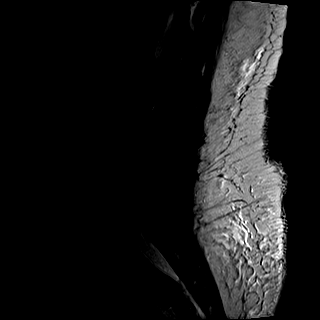
[im 13/17]
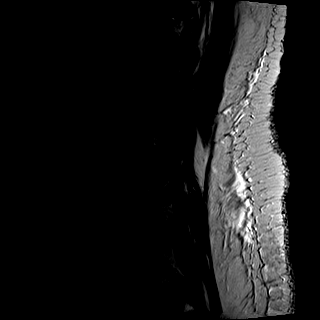
[im 17/17]
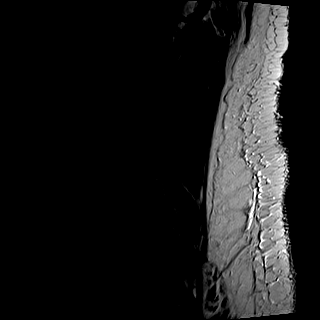

[Series 6: T1 · sagittal · 4.0mm · 0.81mm/px · 6 of 17 slices shown (1 of 2)]
[im 1/17]
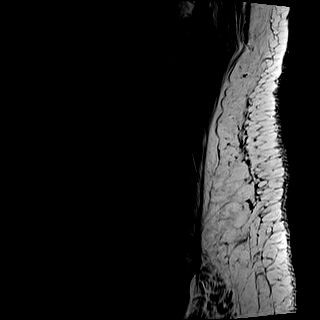
[im 4/17]
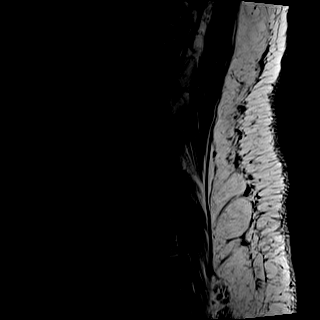
[im 7/17]
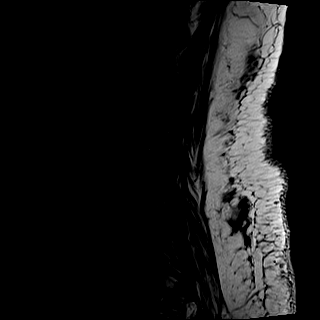
[im 10/17]
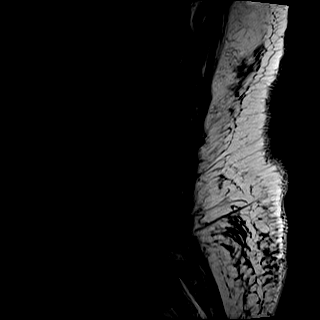
[im 13/17]
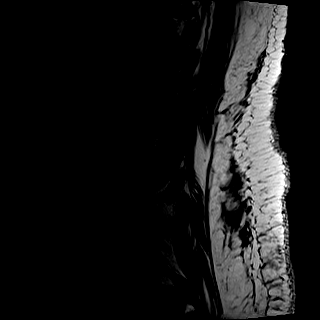
[im 17/17]
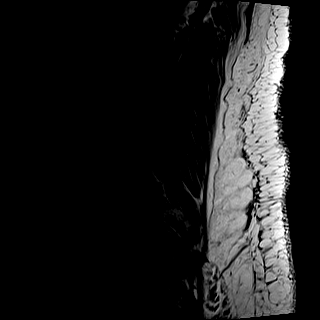

[Series 7: STIR · sagittal · 4.0mm · 0.41mm/px · 1 of 17 slices shown]
[im 1/17]
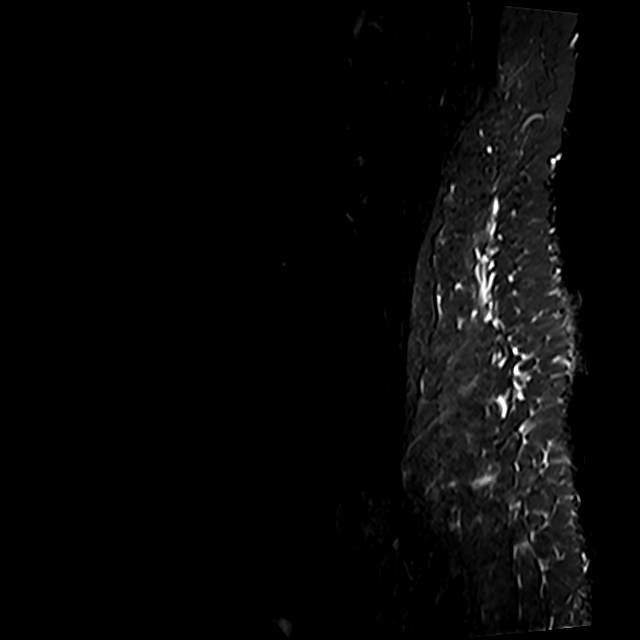

[Series 9: T1 · axial · 4.0mm · 0.39mm/px · z∈[-63,+155]mm · 9 of 39 slices shown (2 of 2)]
[im 1/39]
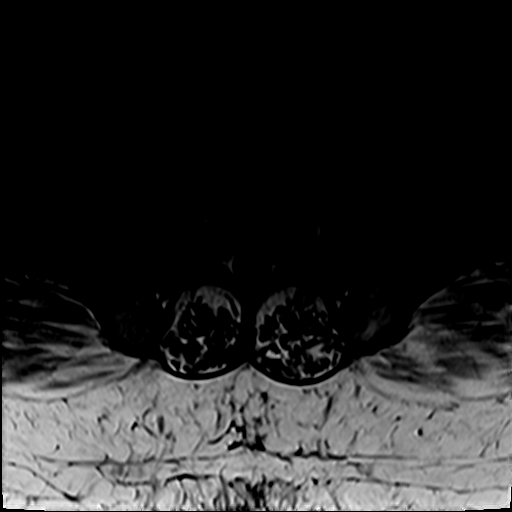
[im 6/39]
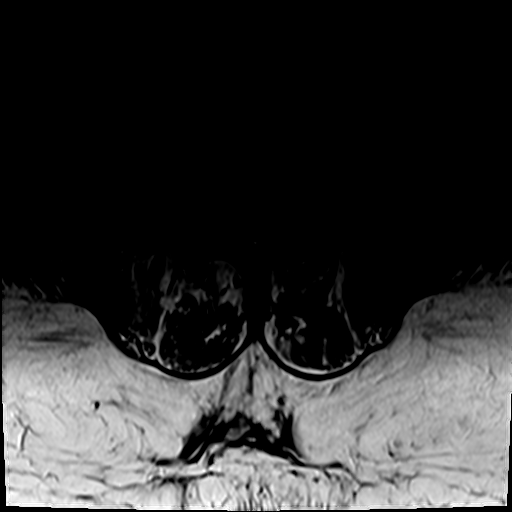
[im 11/39]
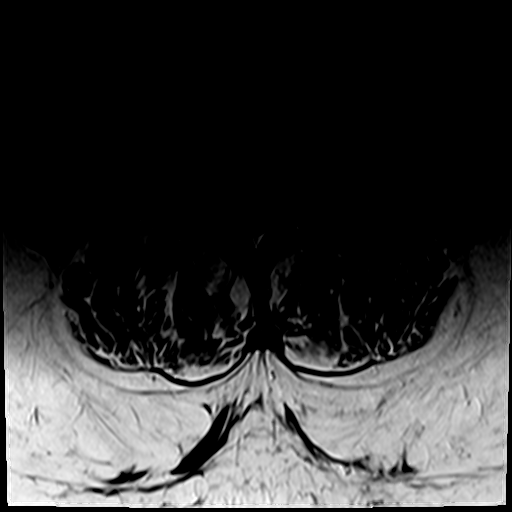
[im 17/39]
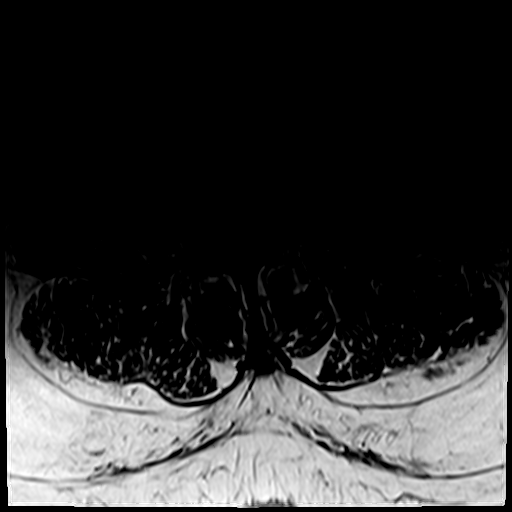
[im 20/39]
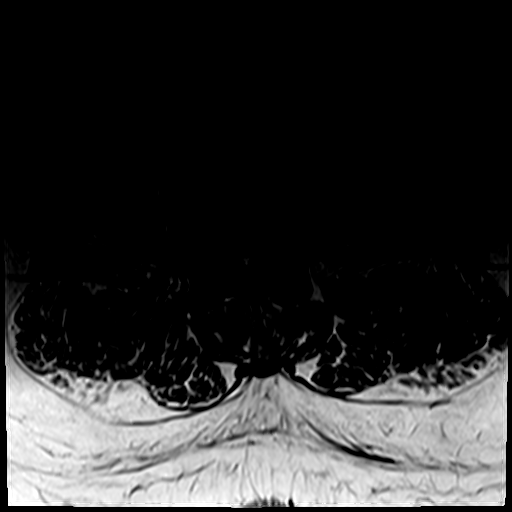
[im 22/39]
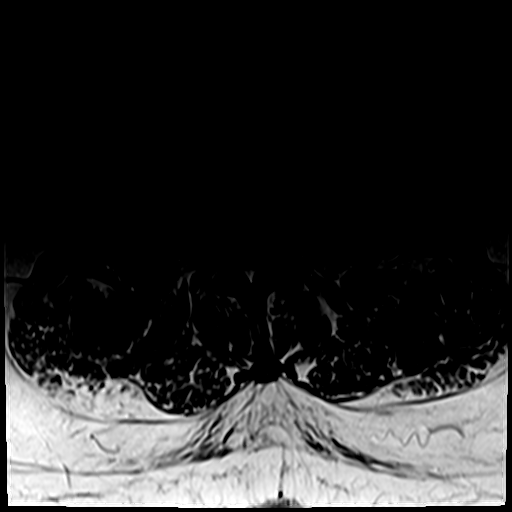
[im 28/39]
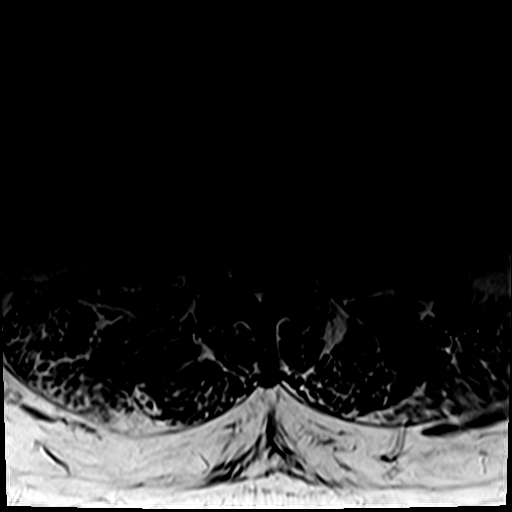
[im 33/39]
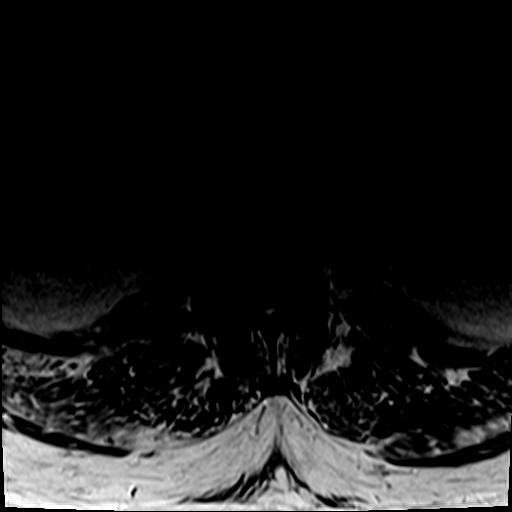
[im 39/39]
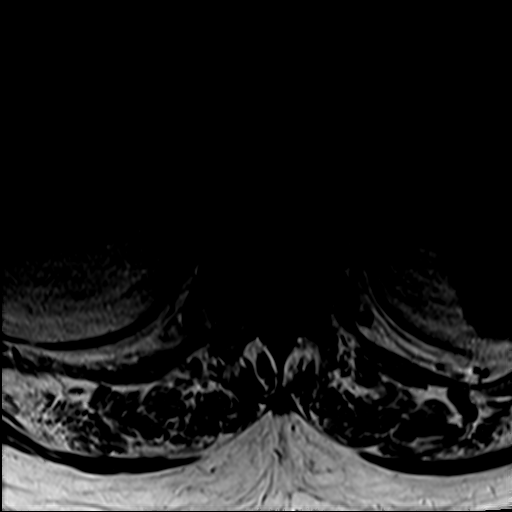

[Series 10: T2 · axial · 4.0mm · 0.78mm/px · z∈[-63,+155]mm · 9 of 39 slices shown (2 of 2)]
[im 1/39]
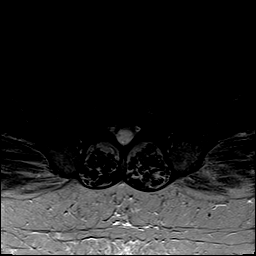
[im 6/39]
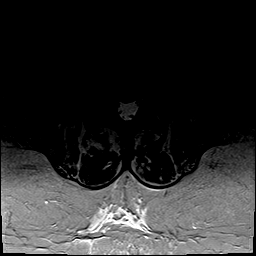
[im 11/39]
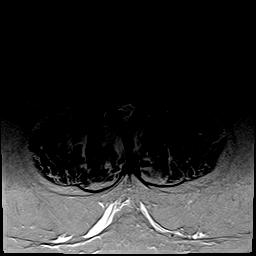
[im 17/39]
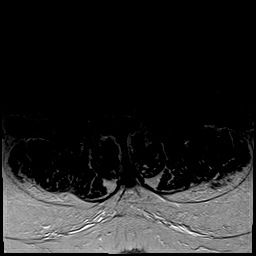
[im 20/39]
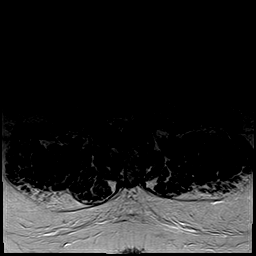
[im 22/39]
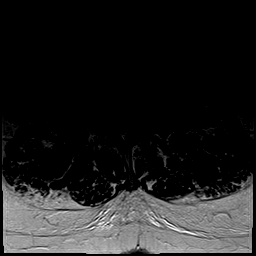
[im 28/39]
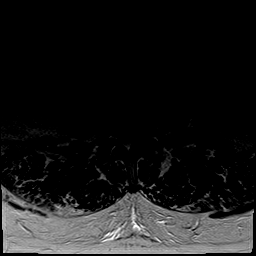
[im 33/39]
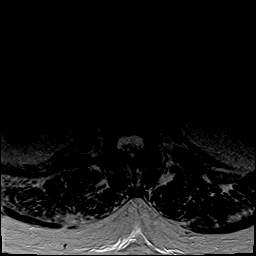
[im 39/39]
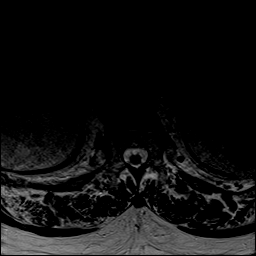

[31 of 48 positions shown; findings below may reference images not displayed]

FINDINGS: Segmentation:  Standard.

Alignment:  Physiologic.

Vertebrae:  No fracture, evidence of discitis, or bone lesion.

Conus medullaris and cauda equina: Conus extends to the T12-L1
level. Conus and cauda equina appear normal.

Paraspinal and other soft tissues: Negative.

Disc levels:

T12-L1: Mild facet degenerative changes. No spinal canal or neural
foraminal stenosis.

L1-2: Symmetric disc bulge and mild facet degenerative changes
resulting in mild spinal canal stenosis and mild bilateral neural
foraminal narrowing.

L2-3: Disc bulge with superimposed left subarticular disc
protrusion, facet degenerative changes and ligamentum flavum
hypertrophy, resulting in moderate spinal canal stenosis effacement
of the left subarticular zone displacing the descending left L3
nerve root. There is also mild right and moderate left neural
foraminal narrowing.

L3-4: Left asymmetric disc bulge, facet degenerative changes and
ligamentum flavum hypertrophy resulting in severe spinal canal
stenosis, mild right and moderate left neural foraminal narrowing.

L4-5: Disc bulge, facet degenerative changes and ligamentum
hypertrophy resulting in mild spinal canal stenosis, moderate to
severe right and moderate left neural foraminal narrowing.

L5-S1: Disc bulge with superimposed central disc protrusion and
facet degenerative changes, right greater than left, resulting in
narrowing of the right subarticular zone, and moderate bilateral
neural foraminal narrowing.

Findings above are accentuated by congenitally short pedicles.
IMPRESSION: 1. Multilevel degenerative changes of the lumbar spine with severe
multifactorial spinal canal stenosis at L3-4.
2. Moderate spinal canal stenosis at L2-3 with effacement of the
left subarticular zone and impingement of the descending left L3
nerve root.
3. Mild spinal canal stenosis at L4-5 with moderate to severe right
and moderate left neural foraminal narrowing.
4. Moderate bilateral neural foraminal narrowing at L5-S1 with
narrowing of the right subarticular zone .

## 2021-11-30 ENCOUNTER — Ambulatory Visit (INDEPENDENT_AMBULATORY_CARE_PROVIDER_SITE_OTHER): Payer: Medicare Other

## 2021-11-30 DIAGNOSIS — Z0000 Encounter for general adult medical examination without abnormal findings: Secondary | ICD-10-CM

## 2021-11-30 NOTE — Progress Notes (Signed)
Virtual Visit via Telephone Note  I connected with  Charles Floyd on 11/30/21 at  9:00 AM EST by telephone and verified that I am speaking with the correct person using two identifiers.  Location: Patient: home Provider: [facility] Persons participating in the virtual visit: Charles Floyd   I discussed the limitations, risks, security and privacy concerns of performing an evaluation and management service by telephone and the availability of in person appointments. The patient expressed understanding and agreed to proceed.  Interactive audio and video telecommunications were attempted between this nurse and patient, however failed, due to patient having technical difficulties OR patient did not have access to video capability.  We continued and completed visit with audio only.  Some vital signs may be absent or patient reported.   Charles David, LPN  Subjective:   Charles Floyd is a 70 y.o. male who presents for Medicare Annual/Subsequent preventive examination.  Review of Systems           Objective:    There were no vitals filed for this visit. There is no height or weight on file to calculate BMI.  Advanced Directives 01/26/2021 11/23/2020 11/18/2019 09/02/2019 09/18/2017 09/17/2017  Does Patient Have a Medical Advance Directive? No No No No No No  Would patient like information on creating a medical advance directive? - No - Patient declined No - Patient declined No - Patient declined No - Patient declined -    Current Medications (verified) Outpatient Encounter Medications as of 11/30/2021  Medication Sig   acetaminophen (TYLENOL) 650 MG CR tablet Take 1,300 mg by mouth every 8 (eight) hours as needed for pain.   allopurinol (ZYLOPRIM) 300 MG tablet TAKE 1 TABLET (300 MG TOTAL) BY MOUTH DAILY. AS NEEDED FOR GOUT   apixaban (ELIQUIS) 5 MG TABS tablet Take 1 tablet (5 mg total) by mouth 2 (two) times daily.   metoprolol succinate (TOPROL XL) 25 MG 24 hr  tablet Take 1 tablet (25 mg total) by mouth daily.   No facility-administered encounter medications on file as of 11/30/2021.    Allergies (verified) Patient has no known allergies.   History: Past Medical History:  Diagnosis Date   Arthritis    BACK   Asthma    AS A CHILD-NO INHALERS AS AN ADULT   Atrial fibrillation ([place]) 08/2019   Cancer of prostate [facility]) 2012   Robotic Assisted prostatectomy 2012 [facility]   Gout    Gout    History of chicken pox    History of kidney stones    H/O   History of measles    History of mumps    Panic attacks    Panic attacks    Sleep apnea    using CPAP occasionally    Past Surgical History:  Procedure Laterality Date   CARDIOVERSION N/A 07/13/2021   Procedure: CARDIOVERSION;  Surgeon: Charles Sable, MD;  Location: [facility] ORS;  Service: Cardiovascular;  Laterality: N/A;   CHOLECYSTECTOMY N/A 09/18/2017   Procedure: LAPAROSCOPIC CHOLECYSTECTOMY WITH INTRAOPERATIVE CHOLANGIOGRAM;  Surgeon: Robert Bellow, MD;  Location: [facility] ORS;  Service: General;  Laterality: N/A;   COLONOSCOPY  Jan 2003, 12-17-13   Dr Charles Castilla   COLONOSCOPY WITH PROPOFOL N/A 01/26/2021   Procedure: COLONOSCOPY WITH PROPOFOL;  Surgeon: Robert Bellow, MD;  Location: [facility];  Service: Endoscopy;  Laterality: N/A;  COVID POSITIVE 01/03/2021   kidney stone extraction  07/2013   stent placed   PROSTATE SURGERY  2012   PROSTATECTOMY  05/16/2011   [facility]. Abdominal Laparoscopic, robotic assisted   Family History  Problem Relation Age of Onset   Cancer Mother        colon and breast   CAD Mother    Anemia Mother    Colon cancer Mother    Congestive Heart Failure Father    Tuberculosis Father    CAD Father    Emphysema Father    Social History   Socioeconomic History   Marital status: Married    Spouse name: Not on file   Number of children: 2   Years of education: Not on file   Highest education level: Master's degree (e.g., MA, MS, MEng, MEd, MSW,  MBA)  Occupational History   Occupation: Archivist  Tobacco Use   Smoking status: Never   Smokeless tobacco: Never  Vaping Use   Vaping Use: Never used  Substance and Sexual Activity   Alcohol use: Yes    Alcohol/week: 0.0 standard drinks    Comment: occasional use 1 drink every couple weeks (wine or beer)   Drug use: No   Sexual activity: Not on file  Other Topics Concern   Not on file  Social History Narrative   Not on file   Social Determinants of Health   Financial Resource Strain: Not on file  Food Insecurity: Not on file  Transportation Needs: Not on file  Physical Activity: Not on file  Stress: Not on file  Social Connections: Not on file    Tobacco Counseling Counseling given: Not Answered   Clinical Intake:  Pre-visit preparation completed: Yes  Pain : No/denies pain     Nutritional Risks: None Diabetes: No  How often do you need to have someone help you when you read instructions, pamphlets, or other written materials from your doctor or pharmacy?: 1 - Never  Diabetic?no  Interpreter Needed?: No  Information entered by :: Charles Shaggy, LPN   Activities of Daily Living In your present state of health, do you have any difficulty performing the following activities: 07/13/2021  Hearing? N  Vision? N  Difficulty concentrating or making decisions? N  Walking or climbing stairs? N  Dressing or bathing? N  Some recent data might be hidden    Patient Care Team: Floyd, Charles Muff, PA-C (Inactive) as PCP - General (Family Medicine) Charles Sable, MD as PCP - Cardiology (Cardiology) Charles Robson, MD as Referring Physician (Ophthalmology) Charles Dixon, PA-C (Orthopedic Surgery) Charles Askew, MD as Referring Physician (Physical Medicine and Rehabilitation) Charles Lipps, MD as Consulting Physician (Pulmonary Disease) Charles Castilla Forest Gleason, MD as Consulting Physician (General Surgery)  Indicate any recent Medical Services you may have  received from other than [facility] providers in the past year (date may be approximate).     Assessment:   This is a routine wellness examination for Charles Floyd.  Hearing/Vision screen No results found.  Dietary issues and exercise activities discussed:     Goals Addressed   None    Depression Screen PHQ 2/9 Scores 11/23/2020 11/18/2019 01/06/2019 08/23/2017 03/23/2016  PHQ - 2 Score 0 0 0 1 0    Fall Risk Fall Risk  11/23/2020 11/18/2019 01/06/2019 08/23/2017 03/23/2016  Falls in the past year? 1 0 0 No Yes  Number falls in past yr: 1 0 - - 2 or more  Injury with Fall? 0 0 - - No  Risk for fall due to : Impaired balance/gait;Impaired mobility;Impaired vision - - - -  Follow up Falls prevention discussed - - - -  FALL RISK PREVENTION PERTAINING TO THE HOME:  Any stairs in or around the home? No  If so, are there any without handrails? No  Home free of loose throw rugs in walkways, pet beds, electrical cords, etc? Yes  Adequate lighting in your home to reduce risk of falls? Yes   ASSISTIVE DEVICES UTILIZED TO PREVENT FALLS:  Life alert? Yes  Use of a cane, walker or w/c? Yes  Grab bars in the bathroom? Yes  Shower chair or bench in shower? No  Elevated toilet seat or a handicapped toilet? Yes   Cognitive Function:Normal cognitive status assessed by direct observation by this Nurse Health Advisor. No abnormalities found.          Immunizations Immunization History  Administered Date(s) Administered   Influenza Split 10/14/2012   Influenza, High Dose Seasonal PF 09/19/2017   Influenza-Unspecified 09/22/2020   Pneumococcal Conjugate-13 03/23/2016   Pneumococcal Polysaccharide-23 09/19/2017   Tdap 03/23/2016   Zoster, Live 10/14/2012    TDAP status: Up to date  Flu Vaccine status: Due, Education has been provided regarding the importance of this vaccine. Advised may receive this vaccine at local pharmacy or Health Dept. Aware to provide a copy of the vaccination record if  obtained from local pharmacy or Health Dept. Verbalized acceptance and understanding.  Pneumococcal vaccine status: Up to date  Covid-19 vaccine status: Completed vaccines  Qualifies for Shingles Vaccine? Yes   Zostavax completed Yes   Shingrix Completed?: No.    Education has been provided regarding the importance of this vaccine. Patient has been advised to call insurance company to determine out of pocket expense if they have not yet received this vaccine. Advised may also receive vaccine at local pharmacy or Health Dept. Verbalized acceptance and understanding.  Screening Tests Health Maintenance  Topic Date Due   COVID-19 Vaccine (1) Never done   Hepatitis C Screening  Never done   Zoster Vaccines- Shingrix (1 of 2) Never done   INFLUENZA VACCINE  07/04/2021   COLONOSCOPY (Pts 45-34yrs Insurance coverage will need to be confirmed)  01/26/2026   TETANUS/TDAP  03/23/2026   Pneumonia Vaccine 31+ Years old  Completed   HPV VACCINES  Aged Out    Health Maintenance  Health Maintenance Due  Topic Date Due   COVID-19 Vaccine (1) Never done   Hepatitis C Screening  Never done   Zoster Vaccines- Shingrix (1 of 2) Never done   INFLUENZA VACCINE  07/04/2021    Colorectal cancer screening: Type of screening: Colonoscopy. Completed 01/26/21. Repeat every 10 years  Lung Cancer Screening: (Low Dose CT Chest recommended if Age 78-80 years, 30 pack-year currently smoking OR have quit w/in 15years.) does not qualify.   Additional Screening:  Hepatitis C Screening: does qualify; Completed no  Vision Screening: Recommended annual ophthalmology exams for early detection of glaucoma and other disorders of the eye. Is the patient up to date with their annual eye exam?  Yes  Who is the provider or what is the name of the office in which the patient attends annual eye exams? [facility] If pt is not established with a provider, would they like to be referred to a provider to establish  care? No .   Dental Screening: Recommended annual dental exams for proper oral hygiene  Community Resource Referral / Chronic Care Management: CRR required this visit?  No   CCM required this visit?  No      Plan:     I have personally reviewed  and noted the following in the patients chart:   Medical and social history Use of alcohol, tobacco or illicit drugs  Current medications and supplements including opioid prescriptions. Patient is not currently taking opioid prescriptions. Functional ability and status Nutritional status Physical activity Advanced directives List of other physicians Hospitalizations, surgeries, and ER visits in previous 12 months Vitals Screenings to include cognitive, depression, and falls Referrals and appointments  In addition, I have reviewed and discussed with patient certain preventive protocols, quality metrics, and best practice recommendations. A written personalized care plan for preventive services as well as general preventive health recommendations were provided to patient.     Charles David, LPN   [id number]   Nurse Notes: none

## 2021-11-30 NOTE — Patient Instructions (Signed)
Charles Floyd , Thank you for taking time to come for your Medicare Wellness Visit. I appreciate your ongoing commitment to your health goals. Please review the following plan we discussed and let me know if I can assist you in the future.   Screening recommendations/referrals: Colonoscopy: 01/26/21 Recommended yearly ophthalmology/optometry visit for glaucoma screening and checkup Recommended yearly dental visit for hygiene and checkup  Vaccinations: Influenza vaccine: due Pneumococcal vaccine: 09/19/17 Tdap vaccine: 03/23/16 Shingles vaccine: Zoster 10/14/12   Covid-19: had original 2, doesn't know dates  Advanced directives: no  Conditions/risks identified: none  Next appointment: Follow up in one year for your annual wellness visit. 12/05/21 @ 11 a.m.  Preventive Care 70 Years and Older, Male Preventive care refers to lifestyle choices and visits with your health care provider that can promote health and wellness. What does preventive care include? A yearly physical exam. This is also called an annual well check. Dental exams once or twice a year. Routine eye exams. Ask your health care provider how often you should have your eyes checked. Personal lifestyle choices, including: Daily care of your teeth and gums. Regular physical activity. Eating a healthy diet. Avoiding tobacco and drug use. Limiting alcohol use. Practicing safe sex. Taking low doses of aspirin every day. Taking vitamin and mineral supplements as recommended by your health care provider. What happens during an annual well check? The services and screenings done by your health care provider during your annual well check will depend on your age, overall health, lifestyle risk factors, and family history of disease. Counseling  Your health care provider may ask you questions about your: Alcohol use. Tobacco use. Drug use. Emotional well-being. Home and relationship well-being. Sexual activity. Eating  habits. History of falls. Memory and ability to understand (cognition). Work and work Statistician. Screening  You may have the following tests or measurements: Height, weight, and BMI. Blood pressure. Lipid and cholesterol levels. These may be checked every 5 years, or more frequently if you are over 65 years old. Skin check. Lung cancer screening. You may have this screening every year starting at age 70 if you have a 30-pack-year history of smoking and currently smoke or have quit within the past 15 years. Fecal occult blood test (FOBT) of the stool. You may have this test every year starting at age 41. Flexible sigmoidoscopy or colonoscopy. You may have a sigmoidoscopy every 5 years or a colonoscopy every 10 years starting at age 27. Prostate cancer screening. Recommendations will vary depending on your family history and other risks. Hepatitis C blood test. Hepatitis B blood test. Sexually transmitted disease (STD) testing. Diabetes screening. This is done by checking your blood sugar (glucose) after you have not eaten for a while (fasting). You may have this done every 1-3 years. Abdominal aortic aneurysm (AAA) screening. You may need this if you are a current or former smoker. Osteoporosis. You may be screened starting at age 70 if you are at high risk. Talk with your health care provider about your test results, treatment options, and if necessary, the need for more tests. Vaccines  Your health care provider may recommend certain vaccines, such as: Influenza vaccine. This is recommended every year. Tetanus, diphtheria, and acellular pertussis (Tdap, Td) vaccine. You may need a Td booster every 10 years. Zoster vaccine. You may need this after age 39. Pneumococcal 13-valent conjugate (PCV13) vaccine. One dose is recommended after age 70. Pneumococcal polysaccharide (PPSV23) vaccine. One dose is recommended after age 70. Talk to your  health care provider about which screenings and  vaccines you need and how often you need them. This information is not intended to replace advice given to you by your health care provider. Make sure you discuss any questions you have with your health care provider. Document Released: 12/17/2015 Document Revised: 08/09/2016 Document Reviewed: 09/21/2015 Elsevier Interactive Patient Education  2017 [place] Prevention in the Home Falls can cause injuries. They can happen to people of all ages. There are many things you can do to make your home safe and to help prevent falls. What can I do on the outside of my home? Regularly fix the edges of walkways and driveways and fix any cracks. Remove anything that might make you trip as you walk through a door, such as a raised step or threshold. Trim any bushes or trees on the path to your home. Use bright outdoor lighting. Clear any walking paths of anything that might make someone trip, such as rocks or tools. Regularly check to see if handrails are loose or broken. Make sure that both sides of any steps have handrails. Any raised decks and porches should have guardrails on the edges. Have any leaves, snow, or ice cleared regularly. Use sand or salt on walking paths during winter. Clean up any spills in your garage right away. This includes oil or grease spills. What can I do in the bathroom? Use night lights. Install grab bars by the toilet and in the tub and shower. Do not use towel bars as grab bars. Use non-skid mats or decals in the tub or shower. If you need to sit down in the shower, use a plastic, non-slip stool. Keep the floor dry. Clean up any water that spills on the floor as soon as it happens. Remove soap buildup in the tub or shower regularly. Attach bath mats securely with double-sided non-slip rug tape. Do not have throw rugs and other things on the floor that can make you trip. What can I do in the bedroom? Use night lights. Make sure that you have a light by your  bed that is easy to reach. Do not use any sheets or blankets that are too big for your bed. They should not hang down onto the floor. Have a firm chair that has side arms. You can use this for support while you get dressed. Do not have throw rugs and other things on the floor that can make you trip. What can I do in the kitchen? Clean up any spills right away. Avoid walking on wet floors. Keep items that you use a lot in easy-to-reach places. If you need to reach something above you, use a strong step stool that has a grab bar. Keep electrical cords out of the way. Do not use floor polish or wax that makes floors slippery. If you must use wax, use non-skid floor wax. Do not have throw rugs and other things on the floor that can make you trip. What can I do with my stairs? Do not leave any items on the stairs. Make sure that there are handrails on both sides of the stairs and use them. Fix handrails that are broken or loose. Make sure that handrails are as long as the stairways. Check any carpeting to make sure that it is firmly attached to the stairs. Fix any carpet that is loose or worn. Avoid having throw rugs at the top or bottom of the stairs. If you do have throw rugs, attach them  to the floor with carpet tape. Make sure that you have a light switch at the top of the stairs and the bottom of the stairs. If you do not have them, ask someone to add them for you. What else can I do to help prevent falls? Wear shoes that: Do not have high heels. Have rubber bottoms. Are comfortable and fit you well. Are closed at the toe. Do not wear sandals. If you use a stepladder: Make sure that it is fully opened. Do not climb a closed stepladder. Make sure that both sides of the stepladder are locked into place. Ask someone to hold it for you, if possible. Clearly mark and make sure that you can see: Any grab bars or handrails. First and last steps. Where the edge of each step is. Use tools that  help you move around (mobility aids) if they are needed. These include: Canes. Walkers. Scooters. Crutches. Turn on the lights when you go into a [place] area. Replace any light bulbs as soon as they burn out. Set up your furniture so you have a clear path. Avoid moving your furniture around. If any of your floors are uneven, fix them. If there are any pets around you, be aware of where they are. Review your medicines with your doctor. Some medicines can make you feel dizzy. This can increase your chance of falling. Ask your doctor what other things that you can do to help prevent falls. This information is not intended to replace advice given to you by your health care provider. Make sure you discuss any questions you have with your health care provider. Document Released: 09/16/2009 Document Revised: 04/27/2016 Document Reviewed: 12/25/2014 Elsevier Interactive Patient Education  2017 [employer].

## 2022-01-30 ENCOUNTER — Other Ambulatory Visit: Payer: Self-pay | Admitting: *Deleted

## 2022-01-30 MED ORDER — METOPROLOL SUCCINATE ER 25 MG PO TB24: 25.0000 mg | ORAL_TABLET | Freq: Every day | ORAL | 0 refills | Status: DC

## 2022-02-02 ENCOUNTER — Encounter: Payer: Self-pay | Admitting: Cardiology

## 2022-02-02 ENCOUNTER — Ambulatory Visit (INDEPENDENT_AMBULATORY_CARE_PROVIDER_SITE_OTHER): Payer: Medicare Other | Admitting: Cardiology

## 2022-02-02 ENCOUNTER — Other Ambulatory Visit: Payer: Self-pay

## 2022-02-02 VITALS — BP 112/71 | HR 81 | Ht 71.5 in | Wt 354.0 lb

## 2022-02-02 DIAGNOSIS — Z6841 Body Mass Index (BMI) 40.0 and over, adult: Secondary | ICD-10-CM

## 2022-02-02 DIAGNOSIS — I4819 Other persistent atrial fibrillation: Secondary | ICD-10-CM | POA: Diagnosis not present

## 2022-02-02 DIAGNOSIS — G4730 Sleep apnea, unspecified: Secondary | ICD-10-CM | POA: Diagnosis not present

## 2022-02-02 NOTE — Progress Notes (Signed)
Cardiology Office Note:    Date:  02/02/2022   ID:  Melton Alar, DOB 05/05/1951, MRN [id number]  PCP:  Margo Common, PA-C (Inactive)  Cardiologist:  Kate Sable, MD  Electrophysiologist:  None   Referring MD: Margo Common, PA-C   Chief Complaint  Patient presents with   Other    6 month follow up -- Patient c.o constant SOB.  Meds reviewed verbally with patient.     History of Present Illness:    Charles Floyd is a 71 y.o. male with a hx of OSA, persistent atrial fibrillation, obesity who presents for follow-up.    Patient being seen for persistent atrial fibrillation, DC cardioversion performed 07/2021 successfully, but patient later on went back to atrial fibrillation.  Not very compliant with CPAP mask.  Endorsed shortness of breath with exertion.  Tolerating Toprol-XL 25 mg daily, no bleeding issues with Eliquis 5 mg twice daily.  Prior notes echocardiogram 09/2019 showed normal systolic and diastolic function, EF 55 to 60%. DC cardioversion successful 07/13/2021 with conversion to normal sinus rhythm  Past Medical History:  Diagnosis Date   Arthritis    BACK   Asthma    AS A CHILD-NO INHALERS AS AN ADULT   Atrial fibrillation ([place]) 08/2019   Cancer of prostate [facility]) 2012   Robotic Assisted prostatectomy 2012 [facility]   Gout    Gout    History of chicken pox    History of kidney stones    H/O   History of measles    History of mumps    Panic attacks    Panic attacks    Sleep apnea    using CPAP occasionally     Past Surgical History:  Procedure Laterality Date   CARDIOVERSION N/A 07/13/2021   Procedure: CARDIOVERSION;  Surgeon: Kate Sable, MD;  Location: [facility] ORS;  Service: Cardiovascular;  Laterality: N/A;   CHOLECYSTECTOMY N/A 09/18/2017   Procedure: LAPAROSCOPIC CHOLECYSTECTOMY WITH INTRAOPERATIVE CHOLANGIOGRAM;  Surgeon: Robert Bellow, MD;  Location: [facility] ORS;  Service: General;  Laterality: N/A;   COLONOSCOPY  Jan 2003,  12-17-13   Dr Bary Castilla   COLONOSCOPY WITH PROPOFOL N/A 01/26/2021   Procedure: COLONOSCOPY WITH PROPOFOL;  Surgeon: Robert Bellow, MD;  Location: [place] ENDOSCOPY;  Service: Endoscopy;  Laterality: N/A;  COVID POSITIVE 01/03/2021   kidney stone extraction  07/2013   stent placed   PROSTATE SURGERY  2012   PROSTATECTOMY  05/16/2011   [facility]. Abdominal Laparoscopic, robotic assisted    Current Medications: Current Meds  Medication Sig   acetaminophen (TYLENOL) 650 MG CR tablet Take 1,300 mg by mouth every 8 (eight) hours as needed for pain.   allopurinol (ZYLOPRIM) 300 MG tablet TAKE 1 TABLET (300 MG TOTAL) BY MOUTH DAILY. AS NEEDED FOR GOUT   apixaban (ELIQUIS) 5 MG TABS tablet Take 1 tablet (5 mg total) by mouth 2 (two) times daily.   metoprolol succinate (TOPROL XL) 25 MG 24 hr tablet Take 1 tablet (25 mg total) by mouth daily.     Allergies:   Patient has no known allergies.   Social History   Socioeconomic History   Marital status: Married    Spouse name: Not on file   Number of children: 2   Years of education: Not on file   Highest education level: Master's degree (e.g., MA, MS, MEng, MEd, MSW, MBA)  Occupational History   Occupation: Archivist  Tobacco Use   Smoking status: Never   Smokeless tobacco: Never  Vaping Use   Vaping Use: Never used  Substance and Sexual Activity   Alcohol use: Yes    Alcohol/week: 0.0 standard drinks    Comment: occasional use 1 drink every couple weeks (wine or beer)   Drug use: No   Sexual activity: Not on file  Other Topics Concern   Not on file  Social History Narrative   Not on file   Social Determinants of Health   Financial Resource Strain: Low Risk    Difficulty of Paying Living Expenses: Not hard at all  Food Insecurity: No Food Insecurity   Worried About Charity fundraiser in the Last Year: Never true   [place] in the Last Year: Never true  Transportation Needs: No Transportation Needs   Lack of Transportation  (Medical): No   Lack of Transportation (Non-Medical): No  Physical Activity: Inactive   Days of Exercise per Week: 0 days   Minutes of Exercise per Session: 0 min  Stress: No Stress Concern Present   Feeling of Stress : Only a little  Social Connections: Moderately Integrated   Frequency of Communication with Friends and Family: More than three times a week   Frequency of Social Gatherings with Friends and Family: More than three times a week   Attends Religious Services: More than 4 times per year   Active Member of [employer] or Organizations: No   Attends Music therapist: Never   Marital Status: Married     Family History: The patient's family history includes Anemia in his mother; CAD in his father and mother; Cancer in his mother; Colon cancer in his mother; Congestive Heart Failure in his father; Emphysema in his father; Tuberculosis in his father.  ROS:   Please see the history of present illness.     All other systems reviewed and are negative.  EKGs/Labs/Other Studies Reviewed:     EKG:  EKG is  ordered today.  The ekg ordered today demonstrates atrial fibrillation, heart rate 81.  Recent Labs: 06/20/2021: BUN 18; Creatinine, Ser 0.94; Hemoglobin 15.4; Platelets 283; Potassium 4.7; Sodium 142  Recent Lipid Panel    Component Value Date/Time   CHOL 149 03/24/2016 0901   TRIG 56 03/24/2016 0901   HDL 38 (L) 03/24/2016 0901   CHOLHDL 3.9 03/24/2016 0901   LDLCALC 100 (H) 03/24/2016 0901    Physical Exam:    VS:  BP 112/71 (BP Location: Left Arm, Patient Position: Sitting, Cuff Size: Normal)    Pulse 81    Ht 5' 11.5" (1.816 m)    Wt (!) 354 lb (160.6 kg)    SpO2 93%    BMI 48.68 kg/m     Wt Readings from Last 3 Encounters:  02/02/22 (!) 354 lb (160.6 kg)  08/04/21 (!) 357 lb (161.9 kg)  07/13/21 (!) 355 lb (161 kg)     GEN:  Well nourished, well developed in no acute distress, obese HEENT: Normal NECK: No JVD; No carotid bruits LYMPHATICS: No  lymphadenopathy CARDIAC: Irregular irregular, distant heart sounds RESPIRATORY: Clear anteriorly, decreased at bases. ABDOMEN: Soft, non-tender, non-distended MUSCULOSKELETAL:  No edema; No deformity  SKIN: Warm and dry NEUROLOGIC:  Alert and oriented x 3 PSYCHIATRIC:  Normal affect   ASSESSMENT:    1. Persistent atrial fibrillation ([place])   2. Morbid obesity with BMI of 45.0-49.9, adult ([place])   3. Sleep apnea, unspecified type    PLAN:    In order of problems listed above:  persistent atrial  fibrillation, s/p DCCV 07/2021 successful initially, return to A-fib couple of days after.  Last echocardiogram 09/2019 normal ejection fraction, EF 55 to 60%, normal LA size.  Clinically asymptomatic. CHA2DS2-VASc score of 1 .  Continue Toprol-XL 25 mg daily, Eliquis 5 mg twice daily.  Morbid obesity, OSA contributing to unsuccessful DC cardioversion. Morbid obesity, weight loss advised. Sleep apnea, advised to follow-up with sleep specialist regarding OSA treatment.  Follow-up 6 months     Medication Adjustments/Labs and Tests Ordered: Current medicines are reviewed at length with the patient today.  Concerns regarding medicines are outlined above.  Orders Placed This Encounter  Procedures   EKG 12-Lead     No orders of the defined types were placed in this encounter.     Patient Instructions  Medication Instructions:  Your physician recommends that you continue on your current medications as directed. Please refer to the Current Medication list given to you today.  *If you need a refill on your cardiac medications before your next appointment, please call your pharmacy*   Lab Work: None ordered If you have labs (blood work) drawn today and your tests are completely normal, you will receive your results only by: [place] (if you have MyChart) OR A paper copy in the mail If you have any lab test that is abnormal or we need to change your treatment, we will call you to  review the results.   Testing/Procedures: None ordered   Follow-Up: At [facility], you and your health needs are our priority.  As part of our continuing mission to provide you with exceptional heart care, we have created designated Provider Care Teams.  These Care Teams include your primary Cardiologist (physician) and Advanced Practice Providers (APPs -  Physician Assistants and Nurse Practitioners) who all work together to provide you with the care you need, when you need it.  We recommend signing up for the patient portal called "MyChart".  Sign up information is provided on this After Visit Summary.  MyChart is used to connect with patients for Virtual Visits (Telemedicine).  Patients are able to view lab/test results, encounter notes, upcoming appointments, etc.  Non-urgent messages can be sent to your provider as well.   To learn more about what you can do with MyChart, go to [email or web address].    Your next appointment:   6 month(s)  The format for your next appointment:   In Person  Provider:   You may see Kate Sable, MD or one of the following Advanced Practice Providers on your designated Care Team:   Murray Hodgkins, NP Christell Faith, PA-C Cadence Kathlen Mody, [place]   Other Instructions     Signed, Kate Sable, MD  02/02/2022 12:36 PM    [place]

## 2022-02-02 NOTE — Patient Instructions (Signed)
Medication Instructions:  ?Your physician recommends that you continue on your current medications as directed. Please refer to the Current Medication list given to you today. ? ?*If you need a refill on your cardiac medications before your next appointment, please call your pharmacy* ? ? ?Lab Work: ?None ordered ?If you have labs (blood work) drawn today and your tests are completely normal, you will receive your results only by: ?MyChart Message (if you have MyChart) OR ?A paper copy in the mail ?If you have any lab test that is abnormal or we need to change your treatment, we will call you to review the results. ? ? ?Testing/Procedures: ?None ordered ? ? ?Follow-Up: ?At [facility], you and your health needs are our priority.  As part of our continuing mission to provide you with exceptional heart care, we have created designated Provider Care Teams.  These Care Teams include your primary Cardiologist (physician) and Advanced Practice Providers (APPs -  Physician Assistants and Nurse Practitioners) who all work together to provide you with the care you need, when you need it. ? ?We recommend signing up for the patient portal called "MyChart".  Sign up information is provided on this After Visit Summary.  MyChart is used to connect with patients for Virtual Visits (Telemedicine).  Patients are able to view lab/test results, encounter notes, upcoming appointments, etc.  Non-urgent messages can be sent to your provider as well.   ?To learn more about what you can do with MyChart, go to [email or web address].   ? ?Your next appointment:   ?6 month(s) ? ?The format for your next appointment:   ?In Person ? ?Provider:   ?You may see Kate Sable, MD or one of the following Advanced Practice Providers on your designated Care Team:   ?Murray Hodgkins, NP ?Christell Faith, PA-C ?Cadence Kathlen Mody, PA-C ? ? ?Other Instructions ? ? ?

## 2022-03-02 ENCOUNTER — Other Ambulatory Visit: Payer: Self-pay

## 2022-03-02 MED ORDER — METOPROLOL SUCCINATE ER 25 MG PO TB24: 25.0000 mg | ORAL_TABLET | Freq: Every day | ORAL | 4 refills | Status: DC

## 2022-05-05 ENCOUNTER — Other Ambulatory Visit: Payer: Self-pay

## 2022-05-05 ENCOUNTER — Other Ambulatory Visit: Payer: Self-pay | Admitting: *Deleted

## 2022-05-05 MED ORDER — METOPROLOL SUCCINATE ER 25 MG PO TB24: 25.0000 mg | ORAL_TABLET | Freq: Every day | ORAL | 3 refills | Status: DC

## 2022-05-05 MED ORDER — APIXABAN 5 MG PO TABS: 5.0000 mg | ORAL_TABLET | Freq: Two times a day (BID) | ORAL | 1 refills | Status: DC

## 2022-05-05 NOTE — Telephone Encounter (Signed)
Prescription refill request for Eliquis received. Indication:Afib Last office visit:3/23 Scr:0.9 Age: 71 Weight:160.6 kg  Prescription refilled

## 2022-05-23 DIAGNOSIS — B9689 Other specified bacterial agents as the cause of diseases classified elsewhere: Secondary | ICD-10-CM | POA: Diagnosis not present

## 2022-05-23 DIAGNOSIS — J019 Acute sinusitis, unspecified: Secondary | ICD-10-CM | POA: Diagnosis not present

## 2022-08-10 ENCOUNTER — Ambulatory Visit: Payer: Medicare Other | Admitting: Cardiology

## 2022-08-18 ENCOUNTER — Other Ambulatory Visit: Payer: Self-pay

## 2022-08-18 MED ORDER — METOPROLOL SUCCINATE ER 25 MG PO TB24: 25.0000 mg | ORAL_TABLET | Freq: Every day | ORAL | 1 refills | Status: DC

## 2022-09-20 ENCOUNTER — Telehealth: Payer: Self-pay | Admitting: Family Medicine

## 2022-09-20 NOTE — Telephone Encounter (Signed)
Patient wants you to be his PCP since [name] and Simona Huh are gone. Please let patient's wife, Chong Sicilian, know.  [phone]

## 2022-09-21 ENCOUNTER — Ambulatory Visit: Payer: Medicare Other | Attending: Cardiology | Admitting: Cardiology

## 2022-09-21 ENCOUNTER — Encounter: Payer: Self-pay | Admitting: Cardiology

## 2022-09-21 VITALS — BP 102/72 | HR 89 | Ht 71.0 in | Wt 348.8 lb

## 2022-09-21 DIAGNOSIS — I4819 Other persistent atrial fibrillation: Secondary | ICD-10-CM | POA: Diagnosis not present

## 2022-09-21 DIAGNOSIS — Z6841 Body Mass Index (BMI) 40.0 and over, adult: Secondary | ICD-10-CM | POA: Diagnosis not present

## 2022-09-21 DIAGNOSIS — G4730 Sleep apnea, unspecified: Secondary | ICD-10-CM | POA: Diagnosis not present

## 2022-09-21 NOTE — Patient Instructions (Signed)
Medication Instructions:  Your physician recommends that you continue on your current medications as directed. Please refer to the Current Medication list given to you today.   *If you need a refill on your cardiac medications before your next appointment, please call your pharmacy*    Follow-Up: At [facility], you and your health needs are our priority.  As part of our continuing mission to provide you with exceptional heart care, we have created designated Provider Care Teams.  These Care Teams include your primary Cardiologist (physician) and Advanced Practice Providers (APPs -  Physician Assistants and Nurse Practitioners) who all work together to provide you with the care you need, when you need it.  We recommend signing up for the patient portal called "MyChart".  Sign up information is provided on this After Visit Summary.  MyChart is used to connect with patients for Virtual Visits (Telemedicine).  Patients are able to view lab/test results, encounter notes, upcoming appointments, etc.  Non-urgent messages can be sent to your provider as well.   To learn more about what you can do with MyChart, go to [email or web address].    Your next appointment:   6 month(s)  The format for your next appointment:   In Person  Provider:   You may see Kate Sable, MD or one of the following Advanced Practice Providers on your designated Care Team:   Murray Hodgkins, NP Christell Faith, PA-C Cadence Kathlen Mody, PA-C Gerrie Nordmann, NP    Other Instructions   Important Information About Sugar

## 2022-09-21 NOTE — Progress Notes (Signed)
Cardiology Office Note:    Date:  09/21/2022   ID:  Charles Floyd, DOB March 02, 1951, MRN [id number]  PCP:  Margo Common, PA-C (Inactive)  Cardiologist:  Kate Sable, MD  Electrophysiologist:  None   Referring MD: No ref. provider found   Chief Complaint  Patient presents with   Follow-up    6 month f/u, fatigue     History of Present Illness:    Charles Floyd is a 71 y.o. male with a hx of OSA, persistent atrial fibrillation s/p failed DC cardioversion, obesity who presents for follow-up.    Feels okay, denies chest pain or shortness of breath, states sleeping better even though not compliant with CPAP mask.  Mask appears too uncomfortable, patient becomes fidgety in his sleep.  Plans to call sleep specialist regarding inspire device for sleep apnea.  Denies palpitations, compliant with Eliquis, Toprol-XL as prescribed.  Denies any bleeding issues.   Prior notes echocardiogram 09/2019 showed normal systolic and diastolic function, EF 55 to 60%. DC cardioversion successful 07/13/2021 with conversion to normal sinus rhythm  Past Medical History:  Diagnosis Date   Arthritis    BACK   Asthma    AS A CHILD-NO INHALERS AS AN ADULT   Atrial fibrillation ([place]) 08/2019   Cancer of prostate [facility]) 2012   Robotic Assisted prostatectomy 2012 [facility]   Gout    Gout    History of chicken pox    History of kidney stones    H/O   History of measles    History of mumps    Panic attacks    Panic attacks    Sleep apnea    using CPAP occasionally     Past Surgical History:  Procedure Laterality Date   CARDIOVERSION N/A 07/13/2021   Procedure: CARDIOVERSION;  Surgeon: Kate Sable, MD;  Location: [facility] ORS;  Service: Cardiovascular;  Laterality: N/A;   CHOLECYSTECTOMY N/A 09/18/2017   Procedure: LAPAROSCOPIC CHOLECYSTECTOMY WITH INTRAOPERATIVE CHOLANGIOGRAM;  Surgeon: Robert Bellow, MD;  Location: [facility] ORS;  Service: General;  Laterality: N/A;   COLONOSCOPY  Jan  2003, 12-17-13   Dr Bary Castilla   COLONOSCOPY WITH PROPOFOL N/A 01/26/2021   Procedure: COLONOSCOPY WITH PROPOFOL;  Surgeon: Robert Bellow, MD;  Location: [place] ENDOSCOPY;  Service: Endoscopy;  Laterality: N/A;  COVID POSITIVE 01/03/2021   kidney stone extraction  07/2013   stent placed   PROSTATE SURGERY  2012   PROSTATECTOMY  05/16/2011   [facility]. Abdominal Laparoscopic, robotic assisted    Current Medications: Current Meds  Medication Sig   acetaminophen (TYLENOL) 650 MG CR tablet Take 1,300 mg by mouth every 8 (eight) hours as needed for pain.   allopurinol (ZYLOPRIM) 300 MG tablet TAKE 1 TABLET (300 MG TOTAL) BY MOUTH DAILY. AS NEEDED FOR GOUT   apixaban (ELIQUIS) 5 MG TABS tablet Take 1 tablet (5 mg total) by mouth 2 (two) times daily.   metoprolol succinate (TOPROL XL) 25 MG 24 hr tablet Take 1 tablet (25 mg total) by mouth daily.     Allergies:   Patient has no known allergies.   Social History   Socioeconomic History   Marital status: Married    Spouse name: Not on file   Number of children: 2   Years of education: Not on file   Highest education level: Master's degree (e.g., MA, MS, MEng, MEd, MSW, MBA)  Occupational History   Occupation: Archivist  Tobacco Use   Smoking status: Never   Smokeless tobacco: Never  Vaping Use   Vaping Use: Never used  Substance and Sexual Activity   Alcohol use: Yes    Alcohol/week: 0.0 standard drinks of alcohol    Comment: occasional use 1 drink every couple weeks (wine or beer)   Drug use: No   Sexual activity: Not on file  Other Topics Concern   Not on file  Social History Narrative   Not on file   Social Determinants of Health   Financial Resource Strain: Low Risk  (11/30/2021)   Overall Financial Resource Strain (CARDIA)    Difficulty of Paying Living Expenses: Not hard at all  Food Insecurity: No Food Insecurity (11/30/2021)   Hunger Vital Sign    Worried About Running Out of Food in the Last Year: Never true    Ran Out  of Food in the Last Year: Never true  Transportation Needs: No Transportation Needs (11/30/2021)   PRAPARE - Hydrologist (Medical): No    Lack of Transportation (Non-Medical): No  Physical Activity: Inactive (11/30/2021)   Exercise Vital Sign    Days of Exercise per Week: 0 days    Minutes of Exercise per Session: 0 min  Stress: No Stress Concern Present (11/30/2021)   [place]    Feeling of Stress : Only a little  Social Connections: Moderately Integrated (11/30/2021)   Social Connection and Isolation Panel [NHANES]    Frequency of Communication with Friends and Family: More than three times a week    Frequency of Social Gatherings with Friends and Family: More than three times a week    Attends Religious Services: More than 4 times per year    Active Member of [employer] or Organizations: No    Attends Music therapist: Never    Marital Status: Married     Family History: The patient's family history includes Anemia in his mother; CAD in his father and mother; Cancer in his mother; Colon cancer in his mother; Congestive Heart Failure in his father; Emphysema in his father; Tuberculosis in his father.  ROS:   Please see the history of present illness.     All other systems reviewed and are negative.  EKGs/Labs/Other Studies Reviewed:     EKG:  EKG is  ordered today.  The ekg ordered today demonstrates atrial fibrillation, heart rate 89.  Recent Labs: No results found for requested labs within last 365 days.  Recent Lipid Panel    Component Value Date/Time   CHOL 149 03/24/2016 0901   TRIG 56 03/24/2016 0901   HDL 38 (L) 03/24/2016 0901   CHOLHDL 3.9 03/24/2016 0901   LDLCALC 100 (H) 03/24/2016 0901    Physical Exam:    VS:  BP 102/72 (BP Location: Left Arm, Patient Position: Sitting, Cuff Size: Normal)   Pulse 89   Ht '5\' 11"'$  (1.803 m)   Wt (!) 348 lb 12.8 oz  (158.2 kg)   SpO2 91%   BMI 48.65 kg/m     Wt Readings from Last 3 Encounters:  09/21/22 (!) 348 lb 12.8 oz (158.2 kg)  02/02/22 (!) 354 lb (160.6 kg)  08/04/21 (!) 357 lb (161.9 kg)     GEN:  Well nourished, well developed in no acute distress, obese HEENT: Normal NECK: No JVD; No carotid bruits CARDIAC: Irregular irregular, distant heart sounds RESPIRATORY: Clear anteriorly, decreased at bases. ABDOMEN: Soft, non-tender, non-distended MUSCULOSKELETAL:  No edema; No deformity  SKIN: Warm and dry NEUROLOGIC:  Alert and oriented x 3 PSYCHIATRIC:  Normal affect   ASSESSMENT:    1. Persistent atrial fibrillation ([place])   2. Morbid obesity with BMI of 45.0-49.9, adult ([place])   3. Sleep apnea, unspecified type    PLAN:    In order of problems listed above:  persistent atrial fibrillation, s/p DCCV 07/2021.  Initially successful but cardioversion did not hold.  Last echocardiogram 09/2019 normal ejection fraction, EF 55 to 60%, normal LA size. CHA2DS2-VASc score of 1 .  Continue Toprol-XL 25 mg daily, Eliquis 5 mg twice daily.  Morbid obesity, OSA contributing to unsuccessful DC cardioversion. Morbid obesity, weight loss advised.  Consider [phone] when supplies become available. Sleep apnea, advised to follow-up with sleep specialist regarding OSA treatment, mask fitting.  Follow-up 6 months  Medication Adjustments/Labs and Tests Ordered: Current medicines are reviewed at length with the patient today.  Concerns regarding medicines are outlined above.  Orders Placed This Encounter  Procedures   EKG 12-Lead     No orders of the defined types were placed in this encounter.     Patient Instructions  Medication Instructions:  Your physician recommends that you continue on your current medications as directed. Please refer to the Current Medication list given to you today.   *If you need a refill on your cardiac medications before your next appointment, please call your  pharmacy*    Follow-Up: At [facility], you and your health needs are our priority.  As part of our continuing mission to provide you with exceptional heart care, we have created designated Provider Care Teams.  These Care Teams include your primary Cardiologist (physician) and Advanced Practice Providers (APPs -  Physician Assistants and Nurse Practitioners) who all work together to provide you with the care you need, when you need it.  We recommend signing up for the patient portal called "MyChart".  Sign up information is provided on this After Visit Summary.  MyChart is used to connect with patients for Virtual Visits (Telemedicine).  Patients are able to view lab/test results, encounter notes, upcoming appointments, etc.  Non-urgent messages can be sent to your provider as well.   To learn more about what you can do with MyChart, go to [email or web address].    Your next appointment:   6 month(s)  The format for your next appointment:   In Person  Provider:   You may see Kate Sable, MD or one of the following Advanced Practice Providers on your designated Care Team:   Murray Hodgkins, NP Christell Faith, PA-C Cadence Kathlen Mody, PA-C Gerrie Nordmann, NP    Other Instructions   Important Information About Sugar         Signed, Kate Sable, MD  09/21/2022 2:03 PM    [place]

## 2022-11-10 ENCOUNTER — Other Ambulatory Visit: Payer: Self-pay

## 2022-11-10 MED ORDER — METOPROLOL SUCCINATE ER 25 MG PO TB24: 25.0000 mg | ORAL_TABLET | Freq: Every day | ORAL | 1 refills | Status: DC

## 2023-01-01 ENCOUNTER — Other Ambulatory Visit: Payer: Self-pay

## 2023-01-01 DIAGNOSIS — I4819 Other persistent atrial fibrillation: Secondary | ICD-10-CM

## 2023-01-01 MED ORDER — APIXABAN 5 MG PO TABS: 5.0000 mg | ORAL_TABLET | Freq: Two times a day (BID) | ORAL | 0 refills | Status: DC

## 2023-01-01 NOTE — Telephone Encounter (Signed)
Prescription refill request for Eliquis received. Indication: Afib  Last office visit: 09/21/22 ([facility]) Scr: 0.94 (06/20/21)  Age: 72 Weight: 158.2kg  Labs overdue. Pt has upcoming appt with [facility]. Note placed on appt to draw labs. Refill sent.

## 2023-03-15 NOTE — Progress Notes (Signed)
I,Sha'taria Tyson,acting as a Neurosurgeonscribe for Shirlee LatchAngela Kinleigh Nault, MD.,have documented all relevant documentation on the behalf of Shirlee LatchAngela Shayden Gingrich, MD,as directed by  Shirlee LatchAngela Ronneisha Jett, MD while in the presence of Shirlee LatchAngela Cambreigh Dearing, MD.   Annual Wellness Visit     Patient: Charles Floyd, Male    DOB: 06/23/1951, 72 y.o.   MRN: [id number] Visit Date: 03/16/2023  Today's Provider: Shirlee LatchAngela Denina Rieger, MD   Chief Complaint  Patient presents with   Annual Exam   Subjective    Charles GentileStanley W Floyd is a 72 y.o. male who presents today for his Annual Wellness Visit. He reports consuming a general diet. The patient does not participate in regular exercise at present. He generally feels fairly well. He reports sleeping poorly. He does not have additional problems to discuss today.   HPI   Chronic low back pain - has done all conservative management  Including ESIs with pain management Walking with 2 canes, mobility is "terrible"  MRI L spine 2021 - IMPRESSION: 1. Multilevel degenerative changes of the lumbar spine with severe multifactorial spinal canal stenosis at L3-4. 2. Moderate spinal canal stenosis at L2-3 with effacement of the left subarticular zone and impingement of the descending left L3 nerve root. 3. Mild spinal canal stenosis at L4-5 with moderate to severe right and moderate left neural foraminal narrowing. 4. Moderate bilateral neural foraminal narrowing at L5-S1 with narrowing of the right subarticular zone .  OSA - not currently treated Sleep study (split night) in 2017 showed severe sleep apnea Was titrated to 12 cm H2O pressure on CPAP Used cpap for few   Medications: Outpatient Medications Prior to Visit  Medication Sig   acetaminophen (TYLENOL) 650 MG CR tablet Take 1,300 mg by mouth every 8 (eight) hours as needed for pain.   apixaban (ELIQUIS) 5 MG TABS tablet Take 1 tablet (5 mg total) by mouth 2 (two) times daily.   metoprolol succinate (TOPROL XL) 25 MG 24  hr tablet Take 1 tablet (25 mg total) by mouth daily.   [DISCONTINUED] allopurinol (ZYLOPRIM) 300 MG tablet TAKE 1 TABLET (300 MG TOTAL) BY MOUTH DAILY. AS NEEDED FOR GOUT   No facility-administered medications prior to visit.    No Known Allergies  Patient Care Team: Erasmo DownerBacigalupo, Indyah Saulnier M, MD as PCP - General (Family Medicine) Debbe OdeaAgbor-Etang, Brian, MD as PCP - Cardiology (Cardiology) Galen ManilaPorfilio, William, MD as Referring Physician (Ophthalmology) Dedra SkeensMundy, Todd, PA-C (Orthopedic Surgery) Elijah BirkMorales, Jennifer I, MD as Referring Physician (Physical Medicine and Rehabilitation) Erin FullingKasa, Kurian, MD as Consulting Physician (Pulmonary Disease) Lemar LivingsByrnett, Merrily PewJeffrey W, MD as Consulting Physician (General Surgery)  Review of Systems  Constitutional:  Positive for activity change, appetite change and fatigue.  HENT:  Positive for rhinorrhea and sneezing.   Respiratory:  Positive for apnea, cough, shortness of breath and wheezing.   Cardiovascular:  Positive for palpitations and leg swelling.  Gastrointestinal:  Positive for constipation.  Endocrine: Positive for polyuria.  Genitourinary:  Positive for enuresis and urgency.  Musculoskeletal:  Positive for arthralgias, back pain and myalgias.  Neurological:  Positive for weakness and numbness.        Objective    Vitals: BP 111/72 (BP Location: Left Arm, Patient Position: Sitting, Cuff Size: Large)   Pulse 80   Wt (!) 352 lb (159.7 kg)   BMI 49.09 kg/m     Physical Exam Vitals reviewed.  Constitutional:      General: He is not in acute distress.    Appearance: Normal appearance. He is not  diaphoretic.  HENT:     Head: Normocephalic and atraumatic.  Eyes:     General: No scleral icterus.    Conjunctiva/sclera: Conjunctivae normal.  Cardiovascular:     Rate and Rhythm: Normal rate and regular rhythm.     Heart sounds: Normal heart sounds. No murmur heard. Pulmonary:     Effort: Pulmonary effort is normal. No respiratory distress.     Breath  sounds: Normal breath sounds. No wheezing or rhonchi.  Musculoskeletal:     Cervical back: Neck supple.     Right lower leg: Edema present.     Left lower leg: Edema present.  Lymphadenopathy:     Cervical: No cervical adenopathy.  Skin:    General: Skin is warm and dry.     Comments: Dry skin of legs and feet, lichenification of b/l LEs Varicose veins in LEs  Neurological:     Mental Status: He is alert and oriented to person, place, and time. Mental status is at baseline.     Gait: Gait abnormal.  Psychiatric:        Mood and Affect: Mood normal.        Behavior: Behavior normal.      Most recent functional status assessment:    03/16/2023    9:15 AM  In your present state of health, do you have any difficulty performing the following activities:  Hearing? 0  Vision? 0  Difficulty concentrating or making decisions? 1  Walking or climbing stairs? 1  Dressing or bathing? 1  Comment sometimes  Doing errands, shopping? 1   Most recent fall risk assessment:    03/16/2023    9:15 AM  Fall Risk   Falls in the past year? 0  Number falls in past yr: 0  Injury with Fall? 0  Risk for fall due to : No Fall Risks  Follow up Falls evaluation completed    Most recent depression screenings:    03/16/2023    9:15 AM 11/30/2021   11:41 AM  PHQ 2/9 Scores  PHQ - 2 Score 2 0  PHQ- 9 Score 10    Most recent cognitive screening:     No data to display         Most recent Audit-C alcohol use screening    11/30/2021   11:40 AM  Alcohol Use Disorder Test (AUDIT)  1. How often do you have a drink containing alcohol? 1  2. How many drinks containing alcohol do you have on a typical day when you are drinking? 0  3. How often do you have six or more drinks on one occasion? 0  AUDIT-C Score 1   A score of 3 or more in women, and 4 or more in men indicates increased risk for alcohol abuse, EXCEPT if all of the points are from question 1   No results found for any visits on  03/16/23.  Assessment & Plan     Annual wellness visit done today including the all of the following: Reviewed patient's Family Medical History Reviewed and updated list of patient's medical providers Assessment of cognitive impairment was done Assessed patient's functional ability Established a written schedule for health screening services Health Risk Assessent Completed and Reviewed  Exercise Activities and Dietary recommendations  Goals      DIET - EAT MORE FRUITS AND VEGETABLES     DIET - INCREASE WATER INTAKE     Recommend to drink at least 6-8 8oz glasses of water per day.  Prevent falls     Recommend to remove any items from the home that may cause slips or trips.        Immunization History  Administered Date(s) Administered   Influenza Split 10/14/2012   Influenza, High Dose Seasonal PF 09/19/2017   Influenza-Unspecified 09/22/2020   Pneumococcal Conjugate-13 03/23/2016   Pneumococcal Polysaccharide-23 09/19/2017   Tdap 03/23/2016   Zoster, Live 10/14/2012    Health Maintenance  Topic Date Due   COVID-19 Vaccine (1) Never done   Hepatitis C Screening  Never done   Zoster Vaccines- Shingrix (1 of 2) Never done   INFLUENZA VACCINE  07/05/2023   Medicare Annual Wellness (AWV)  03/15/2024   COLONOSCOPY (Pts 45-6yrs Insurance coverage will need to be confirmed)  01/26/2026   DTaP/Tdap/Td (2 - Td or Tdap) 03/23/2026   Pneumonia Vaccine 5+ Years old  Completed   HPV VACCINES  Aged Out     Discussed health benefits of physical activity, and encouraged him to engage in regular exercise appropriate for his age and condition.    Problem List Items Addressed This Visit       Cardiovascular and Mediastinum   Persistent atrial fibrillation    In NSR today F/b cardiology Discussed the role that uncontrolled obstructive sleep apnea plays in A-fib management Continue Eliquis and metoprolol Check CBC given chronic anticoagulation      Relevant Orders    CBC with Differential/Platelet     Respiratory   Obstructive sleep apnea    Chronic and uncontrolled Reviewed sleep study from 2017 that shows severe sleep apnea He had a split-night study with CPAP titration where they titrated him to a pressure of 12 cmH2O and saw good improvement in his symptoms He was only able to use his CPAP for a few months and now he feels as though he is being smothered by it He has been without his CPAP for several years He understands that this is contributing to some of his other health problems He agrees to try different mask to see if this would improve his symptoms We will send prescription to get new supplies - CPAP machine is still working        Nervous and Auditory   Chronic left-sided low back pain with left-sided sciatica    Patient has seen Ortho Spine and feels as though he has exhausted injection and medical therapy and PT He is not interested in surgical options Reviewed his MRI which shows multilevel DJD and stenosis Referral to pain management to review other interventional options        Musculoskeletal and Integument   Foraminal stenosis of lumbar region   Relevant Orders   Ambulatory referral to Pain Clinic     Other   History of prostate cancer    S/p prostatectomy Recheck PSA      Relevant Orders   PSA Total (Reflex To Free)   Gout    Chronic and fairly well-controlled Continue allopurinol at current dose Recheck uric acid Goal uric acid less than 6      Relevant Orders   Uric acid   Spinal stenosis of lumbosacral region   Relevant Orders   Ambulatory referral to Pain Clinic   Other Visit Diagnoses     Encounter for Medicare annual wellness exam    -  Primary   Varicose veins of both lower extremities with inflammation       Relevant Orders   Ambulatory referral to Vascular Surgery   Need for hepatitis  C screening test       Relevant Orders   Hepatitis C Antibody   Morbid obesity       Relevant Orders   CBC  with Differential/Platelet   Comprehensive metabolic panel   Lipid panel        Return in about 3 months (around 06/15/2023) for chronic disease f/u.     I, Shirlee Latch, MD, have reviewed all documentation for this visit. The documentation on 03/16/23 for the exam, diagnosis, procedures, and orders are all accurate and complete.   Arabelle Bollig, [name], MD, MPH [facility]

## 2023-03-16 ENCOUNTER — Ambulatory Visit (INDEPENDENT_AMBULATORY_CARE_PROVIDER_SITE_OTHER): Payer: Medicare Other | Admitting: Family Medicine

## 2023-03-16 ENCOUNTER — Encounter: Payer: Self-pay | Admitting: Family Medicine

## 2023-03-16 VITALS — BP 111/72 | HR 80 | Wt 352.0 lb

## 2023-03-16 DIAGNOSIS — M48061 Spinal stenosis, lumbar region without neurogenic claudication: Secondary | ICD-10-CM

## 2023-03-16 DIAGNOSIS — G4733 Obstructive sleep apnea (adult) (pediatric): Secondary | ICD-10-CM | POA: Diagnosis not present

## 2023-03-16 DIAGNOSIS — I8312 Varicose veins of left lower extremity with inflammation: Secondary | ICD-10-CM

## 2023-03-16 DIAGNOSIS — Z1159 Encounter for screening for other viral diseases: Secondary | ICD-10-CM

## 2023-03-16 DIAGNOSIS — M5442 Lumbago with sciatica, left side: Secondary | ICD-10-CM

## 2023-03-16 DIAGNOSIS — Z0000 Encounter for general adult medical examination without abnormal findings: Secondary | ICD-10-CM

## 2023-03-16 DIAGNOSIS — I4819 Other persistent atrial fibrillation: Secondary | ICD-10-CM

## 2023-03-16 DIAGNOSIS — Z8546 Personal history of malignant neoplasm of prostate: Secondary | ICD-10-CM

## 2023-03-16 DIAGNOSIS — Z6841 Body Mass Index (BMI) 40.0 and over, adult: Secondary | ICD-10-CM

## 2023-03-16 DIAGNOSIS — G8929 Other chronic pain: Secondary | ICD-10-CM | POA: Diagnosis not present

## 2023-03-16 DIAGNOSIS — I8311 Varicose veins of right lower extremity with inflammation: Secondary | ICD-10-CM | POA: Diagnosis not present

## 2023-03-16 DIAGNOSIS — M1A0710 Idiopathic chronic gout, right ankle and foot, without tophus (tophi): Secondary | ICD-10-CM | POA: Diagnosis not present

## 2023-03-16 DIAGNOSIS — M4807 Spinal stenosis, lumbosacral region: Secondary | ICD-10-CM

## 2023-03-16 MED ORDER — ALLOPURINOL 300 MG PO TABS: 300.0000 mg | ORAL_TABLET | Freq: Every day | ORAL | 1 refills | Status: DC

## 2023-03-16 NOTE — Patient Instructions (Signed)
   The CDC recommends two doses of Shingrix (the shingles vaccine) separated by 2 to 6 months for adults age 72 years and older. I recommend checking with your insurance plan regarding coverage for this vaccine.   

## 2023-03-16 NOTE — Assessment & Plan Note (Signed)
Chronic and uncontrolled Reviewed sleep study from 2017 that shows severe sleep apnea He had a split-night study with CPAP titration where they titrated him to a pressure of 12 cmH2O and saw good improvement in his symptoms He was only able to use his CPAP for a few months and now he feels as though he is being smothered by it He has been without his CPAP for several years He understands that this is contributing to some of his other health problems He agrees to try different mask to see if this would improve his symptoms We will send prescription to get new supplies - CPAP machine is still working

## 2023-03-16 NOTE — Assessment & Plan Note (Signed)
Patient has seen Ortho Spine and feels as though he has exhausted injection and medical therapy and PT He is not interested in surgical options Reviewed his MRI which shows multilevel DJD and stenosis Referral to pain management to review other interventional options

## 2023-03-16 NOTE — Assessment & Plan Note (Signed)
Chronic and fairly well-controlled Continue allopurinol at current dose Recheck uric acid Goal uric acid less than 6

## 2023-03-16 NOTE — Assessment & Plan Note (Signed)
S/p prostatectomy Recheck PSA

## 2023-03-16 NOTE — Assessment & Plan Note (Signed)
In NSR today F/b cardiology Discussed the role that uncontrolled obstructive sleep apnea plays in A-fib management Continue Eliquis and metoprolol Check CBC given chronic anticoagulation

## 2023-03-17 LAB — PSA TOTAL (REFLEX TO FREE): Prostate Specific Ag, Serum: <0.1 ng/mL (ref 0.0–4.0)

## 2023-03-17 LAB — CBC WITH DIFFERENTIAL/PLATELET
Basophils Absolute: 0.1 10*3/uL (ref 0.0–0.2)
Basos: 1 %
EOS (ABSOLUTE): 0.2 10*3/uL (ref 0.0–0.4)
Eos: 3 %
Hematocrit: 43.9 % (ref 37.5–51.0)
Hemoglobin: 14.6 g/dL (ref 13.0–17.7)
Immature Grans (Abs): 0.1 10*3/uL (ref 0.0–0.1)
Immature Granulocytes: 1 %
Lymphocytes Absolute: 2.8 10*3/uL (ref 0.7–3.1)
Lymphs: 30 %
MCH: 30.8 pg (ref 26.6–33.0)
MCHC: 33.3 g/dL (ref 31.5–35.7)
MCV: 93 fL (ref 79–97)
Monocytes Absolute: 0.9 10*3/uL (ref 0.1–0.9)
Monocytes: 9 %
Neutrophils Absolute: 5.4 10*3/uL (ref 1.4–7.0)
Neutrophils: 56 %
Platelets: 207 10*3/uL (ref 150–450)
RBC: 4.74 x10E6/uL (ref 4.14–5.80)
RDW: 12.2 % (ref 11.6–15.4)
WBC: 9.4 10*3/uL (ref 3.4–10.8)

## 2023-03-17 LAB — COMPREHENSIVE METABOLIC PANEL
ALT: 14 IU/L (ref 0–44)
AST: 17 IU/L (ref 0–40)
Albumin/Globulin Ratio: 1.3 (ref 1.2–2.2)
Albumin: 3.7 g/dL — ABNORMAL LOW (ref 3.8–4.8)
Alkaline Phosphatase: 61 IU/L (ref 44–121)
BUN/Creatinine Ratio: 17 (ref 10–24)
BUN: 16 mg/dL (ref 8–27)
Bilirubin Total: 0.8 mg/dL (ref 0.0–1.2)
CO2: 26 mmol/L (ref 20–29)
Calcium: 9.4 mg/dL (ref 8.6–10.2)
Chloride: 100 mmol/L (ref 96–106)
Creatinine, Ser: 0.95 mg/dL (ref 0.76–1.27)
Globulin, Total: 2.9 g/dL (ref 1.5–4.5)
Glucose: 99 mg/dL (ref 70–99)
Potassium: 4.6 mmol/L (ref 3.5–5.2)
Sodium: 141 mmol/L (ref 134–144)
Total Protein: 6.6 g/dL (ref 6.0–8.5)
eGFR: 85 mL/min/{1.73_m2} (ref >59)

## 2023-03-17 LAB — LIPID PANEL
Chol/HDL Ratio: 3.1 ratio (ref 0.0–5.0)
Cholesterol, Total: 141 mg/dL (ref 100–199)
HDL: 45 mg/dL (ref >39)
LDL Chol Calc (NIH): 85 mg/dL (ref 0–99)
Triglycerides: 50 mg/dL (ref 0–149)
VLDL Cholesterol Cal: 11 mg/dL (ref 5–40)

## 2023-03-17 LAB — URIC ACID: Uric Acid: 7.2 mg/dL (ref 3.8–8.4)

## 2023-03-17 LAB — HEPATITIS C ANTIBODY: Hep C Virus Ab: NONREACTIVE

## 2023-03-29 ENCOUNTER — Ambulatory Visit: Payer: Medicare Other | Attending: Cardiology | Admitting: Cardiology

## 2023-03-29 ENCOUNTER — Encounter: Payer: Self-pay | Admitting: Cardiology

## 2023-03-29 VITALS — BP 112/82 | HR 70 | Ht 71.0 in | Wt 346.4 lb

## 2023-03-29 DIAGNOSIS — I4821 Permanent atrial fibrillation: Secondary | ICD-10-CM | POA: Insufficient documentation

## 2023-03-29 NOTE — Progress Notes (Signed)
Cardiology Office Note:    Date:  03/29/2023   ID:  Charles Floyd, DOB May 19, 1951, MRN [id number]  PCP:  Erasmo Downer, MD  Cardiologist:  Debbe Odea, MD  Electrophysiologist:  None   Referring MD: No ref. provider found   Chief Complaint  Patient presents with   Follow-up    4 episodes of waking up with low heart rate, as low as 29bpm, in the past 2-3 weeks.      History of Present Illness:    Charles Floyd is a 72 y.o. male with a hx of OSA, permanent A-fib (s/p failed DC cardioversion), OSA, obesity who presents for follow-up.    No new concerns at this time, states having low heart rates occasionally for the first few seconds when he wakes up and checks his heart rate.  Within a few seconds, his heart rate becomes normal around 60s bpm.  Denies palpitations, denies any bleeding episodes with taking Eliquis.  Plans to try a new device for sleep apnea management.  No new cardiac concerns at this time.   Prior notes echocardiogram 09/2019 showed normal systolic and diastolic function, EF 55 to 60%. DC cardioversion successful 07/13/2021 with conversion to normal sinus rhythm  Past Medical History:  Diagnosis Date   Arthritis    BACK   Asthma    AS A CHILD-NO INHALERS AS AN ADULT   Atrial fibrillation 08/2019   Cancer of prostate 2012   Robotic Assisted prostatectomy 2012 [facility]   Gout    Gout    History of chicken pox    History of kidney stones    H/O   History of measles    History of mumps    Panic attacks    Panic attacks    Sleep apnea    using CPAP occasionally     Past Surgical History:  Procedure Laterality Date   CARDIOVERSION N/A 07/13/2021   Procedure: CARDIOVERSION;  Surgeon: Debbe Odea, MD;  Location: [facility] ORS;  Service: Cardiovascular;  Laterality: N/A;   CATARACT EXTRACTION Bilateral 04/2021   CHOLECYSTECTOMY N/A 09/18/2017   Procedure: LAPAROSCOPIC CHOLECYSTECTOMY WITH INTRAOPERATIVE CHOLANGIOGRAM;  Surgeon: Earline Mayotte, MD;  Location: [facility] ORS;  Service: General;  Laterality: N/A;   COLONOSCOPY  Jan 2003, 12-17-13   Dr Lemar Livings   COLONOSCOPY WITH PROPOFOL N/A 01/26/2021   Procedure: COLONOSCOPY WITH PROPOFOL;  Surgeon: Earline Mayotte, MD;  Location: [facility] ENDOSCOPY;  Service: Endoscopy;  Laterality: N/A;  COVID POSITIVE 01/03/2021   kidney stone extraction  07/2013   stent placed   PROSTATE SURGERY  2012   PROSTATECTOMY  05/16/2011   [facility]. Abdominal Laparoscopic, robotic assisted    Current Medications: Current Meds  Medication Sig   acetaminophen (TYLENOL) 650 MG CR tablet Take 1,300 mg by mouth every 8 (eight) hours as needed for pain.   allopurinol (ZYLOPRIM) 300 MG tablet Take 1 tablet (300 mg total) by mouth daily.   apixaban (ELIQUIS) 5 MG TABS tablet Take 1 tablet (5 mg total) by mouth 2 (two) times daily.   metoprolol succinate (TOPROL XL) 25 MG 24 hr tablet Take 1 tablet (25 mg total) by mouth daily.     Allergies:   Patient has no known allergies.   Social History   Socioeconomic History   Marital status: Married    Spouse name: Not on file   Number of children: 2   Years of education: Not on file   Highest education level: Master's degree (e.g.,  MA, MS, MEng, MEd, MSW, MBA)  Occupational History   Occupation: Academic librarian  Tobacco Use   Smoking status: Never   Smokeless tobacco: Never  Vaping Use   Vaping Use: Never used  Substance and Sexual Activity   Alcohol use: Yes    Alcohol/week: 0.0 standard drinks of alcohol    Comment: occasional use 1 drink every couple weeks (wine or beer)   Drug use: No   Sexual activity: Not on file  Other Topics Concern   Not on file  Social History Narrative   Not on file   Social Determinants of Health   Financial Resource Strain: Low Risk  (11/30/2021)   Overall Financial Resource Strain (CARDIA)    Difficulty of Paying Living Expenses: Not hard at all  Food Insecurity: No Food Insecurity (11/30/2021)   Hunger Vital Sign     Worried About Running Out of Food in the Last Year: Never true    Ran Out of Food in the Last Year: Never true  Transportation Needs: No Transportation Needs (11/30/2021)   PRAPARE - Administrator, Civil Service (Medical): No    Lack of Transportation (Non-Medical): No  Physical Activity: Inactive (11/30/2021)   Exercise Vital Sign    Days of Exercise per Week: 0 days    Minutes of Exercise per Session: 0 min  Stress: No Stress Concern Present (11/30/2021)   [employer] of Occupational Health - Occupational Stress Questionnaire    Feeling of Stress : Only a little  Social Connections: Moderately Integrated (11/30/2021)   Social Connection and Isolation Panel [NHANES]    Frequency of Communication with Friends and Family: More than three times a week    Frequency of Social Gatherings with Friends and Family: More than three times a week    Attends Religious Services: More than 4 times per year    Active Member of [employer] or Organizations: No    Attends Engineer, structural: Never    Marital Status: Married     Family History: The patient's family history includes Anemia in his mother; CAD in his father and mother; Cancer in his mother; Colon cancer in his mother; Congestive Heart Failure in his father; Emphysema in his father; Tuberculosis in his father.  ROS:   Please see the history of present illness.     All other systems reviewed and are negative.  EKGs/Labs/Other Studies Reviewed:     EKG:  EKG is  ordered today.  The ekg ordered today demonstrates atrial fibrillation, heart rate 70  Recent Labs: 03/16/2023: ALT 14; BUN 16; Creatinine, Ser 0.95; Hemoglobin 14.6; Platelets 207; Potassium 4.6; Sodium 141  Recent Lipid Panel    Component Value Date/Time   CHOL 141 03/16/2023 1006   TRIG 50 03/16/2023 1006   HDL 45 03/16/2023 1006   CHOLHDL 3.1 03/16/2023 1006   LDLCALC 85 03/16/2023 1006    Physical Exam:    VS:  BP 112/82 (BP Location: Left  Arm, Patient Position: Sitting, Cuff Size: Large)   Pulse 70   Ht 5\' 11"  (1.803 m)   Wt (!) 346 lb 6.4 oz (157.1 kg)   SpO2 96%   BMI 48.31 kg/m     Wt Readings from Last 3 Encounters:  03/29/23 (!) 346 lb 6.4 oz (157.1 kg)  03/16/23 (!) 352 lb (159.7 kg)  09/21/22 (!) 348 lb 12.8 oz (158.2 kg)     GEN:  Well nourished, well developed in no acute distress, obese HEENT: Normal NECK:  No JVD; No carotid bruits CARDIAC: Irregular irregular, distant heart sounds RESPIRATORY: Clear anteriorly, decreased at bases. ABDOMEN: Soft, non-tender, non-distended MUSCULOSKELETAL:  No edema; No deformity  SKIN: Warm and dry NEUROLOGIC:  Alert and oriented x 3 PSYCHIATRIC:  Normal affect   ASSESSMENT:    1. Permanent atrial fibrillation   2. Morbid obesity    PLAN:    In order of problems listed above:  persistent atrial fibrillation, s/p failed DCCV 07/2021.  Initially successful but cardioversion did not hold.  Last EF 55 to 60%, normal LA size. CHA2DS2-VASc score of 1 .  Continue Toprol-XL 25 mg daily, Eliquis 5 mg twice daily.  Morbid obesity, OSA contributing to failed rhythm control strategy. Morbid obesity, low-calorie diet, weight loss advised.   Follow-up 12 months  Medication Adjustments/Labs and Tests Ordered: Current medicines are reviewed at length with the patient today.  Concerns regarding medicines are outlined above.  Orders Placed This Encounter  Procedures   EKG 12-Lead     No orders of the defined types were placed in this encounter.     Patient Instructions  Medication Instructions:   Your physician recommends that you continue on your current medications as directed. Please refer to the Current Medication list given to you today.  *If you need a refill on your cardiac medications before your next appointment, please call your pharmacy*   Lab Work:  None Ordered  If you have labs (blood work) drawn today and your tests are completely normal, you will  receive your results only by: MyChart Message (if you have MyChart) OR A paper copy in the mail If you have any lab test that is abnormal or we need to change your treatment, we will call you to review the results.   Testing/Procedures:  None Ordered   Follow-Up: At [facility], you and your health needs are our priority.  As part of our continuing mission to provide you with exceptional heart care, we have created designated Provider Care Teams.  These Care Teams include your primary Cardiologist (physician) and Advanced Practice Providers (APPs -  Physician Assistants and Nurse Practitioners) who all work together to provide you with the care you need, when you need it.  We recommend signing up for the patient portal called "MyChart".  Sign up information is provided on this After Visit Summary.  MyChart is used to connect with patients for Virtual Visits (Telemedicine).  Patients are able to view lab/test results, encounter notes, upcoming appointments, etc.  Non-urgent messages can be sent to your provider as well.   To learn more about what you can do with MyChart, go to [email or web address].    Your next appointment:   12 month(s)  Provider:   You may see Debbe Odea, MD or one of the following Advanced Practice Providers on your designated Care Team:   Nicolasa Ducking, NP Eula Listen, PA-C Cadence Fransico Michael, PA-C Charlsie Quest, NP   Signed, Debbe Odea, MD  03/29/2023 1:39 PM    [facility]

## 2023-03-29 NOTE — Patient Instructions (Signed)
Medication Instructions:   Your physician recommends that you continue on your current medications as directed. Please refer to the Current Medication list given to you today.  *If you need a refill on your cardiac medications before your next appointment, please call your pharmacy*   Lab Work:  None Ordered  If you have labs (blood work) drawn today and your tests are completely normal, you will receive your results only by: MyChart Message (if you have MyChart) OR A paper copy in the mail If you have any lab test that is abnormal or we need to change your treatment, we will call you to review the results.   Testing/Procedures:  None Ordered    Follow-Up: At [facility], you and your health needs are our priority.  As part of our continuing mission to provide you with exceptional heart care, we have created designated Provider Care Teams.  These Care Teams include your primary Cardiologist (physician) and Advanced Practice Providers (APPs -  Physician Assistants and Nurse Practitioners) who all work together to provide you with the care you need, when you need it.  We recommend signing up for the patient portal called "MyChart".  Sign up information is provided on this After Visit Summary.  MyChart is used to connect with patients for Virtual Visits (Telemedicine).  Patients are able to view lab/test results, encounter notes, upcoming appointments, etc.  Non-urgent messages can be sent to your provider as well.   To learn more about what you can do with MyChart, go to [email or web address].    Your next appointment:   12 month(s)  Provider:   You may see Brian Agbor-Etang, MD or one of the following Advanced Practice Providers on your designated Care Team:   Christopher Berge, NP Ryan Dunn, PA-C Cadence Furth, PA-C Sheri Hammock, NP  

## 2023-04-13 ENCOUNTER — Other Ambulatory Visit (INDEPENDENT_AMBULATORY_CARE_PROVIDER_SITE_OTHER): Payer: Self-pay | Admitting: Nurse Practitioner

## 2023-04-13 DIAGNOSIS — I8312 Varicose veins of left lower extremity with inflammation: Secondary | ICD-10-CM

## 2023-04-17 ENCOUNTER — Encounter (INDEPENDENT_AMBULATORY_CARE_PROVIDER_SITE_OTHER): Payer: Medicare Other

## 2023-04-17 ENCOUNTER — Encounter (INDEPENDENT_AMBULATORY_CARE_PROVIDER_SITE_OTHER): Payer: Medicare Other | Admitting: Vascular Surgery

## 2023-05-08 ENCOUNTER — Other Ambulatory Visit: Payer: Self-pay

## 2023-05-08 MED ORDER — METOPROLOL SUCCINATE ER 25 MG PO TB24: 25.0000 mg | ORAL_TABLET | Freq: Every day | ORAL | 2 refills | Status: DC

## 2023-05-08 NOTE — Telephone Encounter (Signed)
Requested Prescriptions   Signed Prescriptions Disp Refills   metoprolol succinate (TOPROL XL) 25 MG 24 hr tablet 90 tablet 2    Sig: Take 1 tablet (25 mg total) by mouth daily.    Authorizing Provider: Debbe Odea    Ordering User: Guerry Minors  ]

## 2023-05-09 ENCOUNTER — Encounter (INDEPENDENT_AMBULATORY_CARE_PROVIDER_SITE_OTHER): Payer: Self-pay | Admitting: Nurse Practitioner

## 2023-05-09 ENCOUNTER — Ambulatory Visit (INDEPENDENT_AMBULATORY_CARE_PROVIDER_SITE_OTHER): Payer: Medicare Other

## 2023-05-09 ENCOUNTER — Ambulatory Visit (INDEPENDENT_AMBULATORY_CARE_PROVIDER_SITE_OTHER): Payer: Medicare Other | Admitting: Nurse Practitioner

## 2023-05-09 VITALS — BP 123/79 | HR 74 | Resp 16 | Ht 71.0 in | Wt 345.0 lb

## 2023-05-09 DIAGNOSIS — M48061 Spinal stenosis, lumbar region without neurogenic claudication: Secondary | ICD-10-CM | POA: Diagnosis not present

## 2023-05-09 DIAGNOSIS — I8393 Asymptomatic varicose veins of bilateral lower extremities: Secondary | ICD-10-CM

## 2023-05-09 DIAGNOSIS — I8311 Varicose veins of right lower extremity with inflammation: Secondary | ICD-10-CM

## 2023-05-09 DIAGNOSIS — I8312 Varicose veins of left lower extremity with inflammation: Secondary | ICD-10-CM

## 2023-05-10 ENCOUNTER — Encounter (INDEPENDENT_AMBULATORY_CARE_PROVIDER_SITE_OTHER): Payer: Self-pay | Admitting: Nurse Practitioner

## 2023-05-10 NOTE — Progress Notes (Signed)
Subjective:    Patient ID: Charles Floyd, male    DOB: Sep 24, 1951, 72 y.o.   MRN: [id number] No chief complaint on file.   Charles Floyd is a 72 year old male who presents today as a referral from Dr. Beryle Flock in regards to varicose veins.  The patient denies scaling his lower extremities laterally.  He notes that he has accidentally hit the oozing otherwise he would the varicosities on his he notes that he has more issues with his lower back for which he uses 2 canes.  He has notable spinal stenosis.  He denies any venous ulcerations or history of.  Today noninvasive study showed no evidence of DVT or superficial phlebitis bilaterally.  No evidence of deep venous insufficiency bilaterally.  He has reflux in the bilateral great saphenous veins at the worst being in the right lower extremity.    Review of Systems  Cardiovascular:  Negative for leg swelling.  Musculoskeletal:  Positive for arthralgias and back pain.  Neurological:  Positive for weakness.  All other systems reviewed and are negative.      Objective:   Physical Exam Vitals reviewed.  HENT:     Head: Normocephalic.  Cardiovascular:     Rate and Rhythm: Normal rate.  Pulmonary:     Effort: Pulmonary effort is normal.  Skin:    General: Skin is warm and dry.  Neurological:     Mental Status: He is alert and oriented to person, place, and time.  Psychiatric:        Mood and Affect: Mood normal.        Behavior: Behavior normal.        Thought Content: Thought content normal.        Judgment: Judgment normal.     BP 123/79 (BP Location: Left Arm)   Pulse 74   Resp 16   Ht 5\' 11"  (1.803 m)   Wt (!) 345 lb (156.5 kg)   BMI 48.12 kg/m   Past Medical History:  Diagnosis Date   Arthritis    BACK   Asthma    AS A CHILD-NO INHALERS AS AN ADULT   Atrial fibrillation (HCC) 08/2019   Cancer of prostate [facility]) 2012   Robotic Assisted prostatectomy 2012 [facility]   Gout    Gout    History of chicken pox     History of kidney stones    H/O   History of measles    History of mumps    Panic attacks    Panic attacks    Sleep apnea    using CPAP occasionally     Social History   Socioeconomic History   Marital status: Married    Spouse name: Not on file   Number of children: 2   Years of education: Not on file   Highest education level: Master's degree (e.g., MA, MS, MEng, MEd, MSW, MBA)  Occupational History   Occupation: Academic librarian  Tobacco Use   Smoking status: Never   Smokeless tobacco: Never  Vaping Use   Vaping Use: Never used  Substance and Sexual Activity   Alcohol use: Yes    Alcohol/week: 0.0 standard drinks of alcohol    Comment: occasional use 1 drink every couple weeks (wine or beer)   Drug use: No   Sexual activity: Not on file  Other Topics Concern   Not on file  Social History Narrative   Not on file   Social Determinants of Health   Financial Resource Strain: Low Risk  (  11/30/2021)   Overall Financial Resource Strain (CARDIA)    Difficulty of Paying Living Expenses: Not hard at all  Food Insecurity: No Food Insecurity (11/30/2021)   Hunger Vital Sign    Worried About Running Out of Food in the Last Year: Never true    Ran Out of Food in the Last Year: Never true  Transportation Needs: No Transportation Needs (11/30/2021)   PRAPARE - Administrator, Civil Service (Medical): No    Lack of Transportation (Non-Medical): No  Physical Activity: Inactive (11/30/2021)   Exercise Vital Sign    Days of Exercise per Week: 0 days    Minutes of Exercise per Session: 0 min  Stress: No Stress Concern Present (11/30/2021)   [employer] of Occupational Health - Occupational Stress Questionnaire    Feeling of Stress : Only a little  Social Connections: Moderately Integrated (11/30/2021)   Social Connection and Isolation Panel [NHANES]    Frequency of Communication with Friends and Family: More than three times a week    Frequency of Social Gatherings  with Friends and Family: More than three times a week    Attends Religious Services: More than 4 times per year    Active Member of [employer] or Organizations: No    Attends Banker Meetings: Never    Marital Status: Married  Catering manager Violence: Not At Risk (11/30/2021)   Humiliation, Afraid, Rape, and Kick questionnaire    Fear of Current or Ex-Partner: No    Emotionally Abused: No    Physically Abused: No    Sexually Abused: No    Past Surgical History:  Procedure Laterality Date   CARDIOVERSION N/A 07/13/2021   Procedure: CARDIOVERSION;  Surgeon: Debbe Odea, MD;  Location: [facility] ORS;  Service: Cardiovascular;  Laterality: N/A;   CATARACT EXTRACTION Bilateral 04/2021   CHOLECYSTECTOMY N/A 09/18/2017   Procedure: LAPAROSCOPIC CHOLECYSTECTOMY WITH INTRAOPERATIVE CHOLANGIOGRAM;  Surgeon: Earline Mayotte, MD;  Location: [facility] ORS;  Service: General;  Laterality: N/A;   COLONOSCOPY  Jan 2003, 12-17-13   Dr Lemar Livings   COLONOSCOPY WITH PROPOFOL N/A 01/26/2021   Procedure: COLONOSCOPY WITH PROPOFOL;  Surgeon: Earline Mayotte, MD;  Location: [facility] ENDOSCOPY;  Service: Endoscopy;  Laterality: N/A;  COVID POSITIVE 01/03/2021   kidney stone extraction  07/2013   stent placed   PROSTATE SURGERY  2012   PROSTATECTOMY  05/16/2011   [facility]. Abdominal Laparoscopic, robotic assisted    Family History  Problem Relation Age of Onset   Cancer Mother        colon and breast   CAD Mother    Anemia Mother    Colon cancer Mother    Congestive Heart Failure Father    Tuberculosis Father    CAD Father    Emphysema Father     No Known Allergies     Latest Ref Rng & Units 03/16/2023   10:06 AM 06/20/2021    2:16 PM 09/02/2019    5:36 PM  CBC  WBC 3.4 - 10.8 x10E3/uL 9.4  15.6  13.2   Hemoglobin 13.0 - 17.7 g/dL 16.1  09.6  04.5   Hematocrit 37.5 - 51.0 % 43.9  46.4  48.6   Platelets 150 - 450 x10E3/uL 207  283  246       CMP     Component Value Date/Time   NA  141 03/16/2023 1006   K 4.6 03/16/2023 1006   CL 100 03/16/2023 1006   CO2 26 03/16/2023 1006  GLUCOSE 99 03/16/2023 1006   GLUCOSE 104 (H) 09/02/2019 1736   BUN 16 03/16/2023 1006   CREATININE 0.95 03/16/2023 1006   CREATININE 1.16 08/23/2017 1238   CALCIUM 9.4 03/16/2023 1006   PROT 6.6 03/16/2023 1006   ALBUMIN 3.7 (L) 03/16/2023 1006   AST 17 03/16/2023 1006   ALT 14 03/16/2023 1006   ALKPHOS 61 03/16/2023 1006   BILITOT 0.8 03/16/2023 1006   EGFR 85 03/16/2023 1006   GFRNONAA >60 09/02/2019 1736     No results found.     Assessment & Plan:   1. Asymptomatic varicose veins of both lower extremities I had a long discussion with the patient and his wife in regards to his results.  He does have evidence of venous reflux bilaterally but he is asymptomatic.  We discussed the benefit of endovenous laser ablation and because he is asymptomatic at this point really the greatest benefit would be to decrease in the size of the vein itself.  He has significant dry skin but you with endovenous laser ablation that would not drastically improve his dryness of his lower extremities.  Making observation with the patient he very dry skin in general on both his upper arms as well as legs.  We discussed use of medical grade compression socks, elevation and activity although that will be difficult for the patient.  He will follow-up with [place] on an as-needed basis.  2. Foraminal stenosis of lumbar region This will be difficult for the patient to exercise   Current Outpatient Medications on File Prior to Visit  Medication Sig Dispense Refill   acetaminophen (TYLENOL) 650 MG CR tablet Take 1,300 mg by mouth every 8 (eight) hours as needed for pain.     allopurinol (ZYLOPRIM) 300 MG tablet Take 1 tablet (300 mg total) by mouth daily. 90 tablet 1   apixaban (ELIQUIS) 5 MG TABS tablet Take 1 tablet (5 mg total) by mouth 2 (two) times daily. 180 tablet 0   metoprolol succinate (TOPROL XL) 25 MG 24 hr  tablet Take 1 tablet (25 mg total) by mouth daily. 90 tablet 2   No current facility-administered medications on file prior to visit.    There are no Patient Instructions on file for this visit. No follow-ups on file.   Georgiana Spinner, NP

## 2023-06-18 ENCOUNTER — Encounter: Payer: Self-pay | Admitting: Family Medicine

## 2023-06-18 ENCOUNTER — Ambulatory Visit (INDEPENDENT_AMBULATORY_CARE_PROVIDER_SITE_OTHER): Payer: Medicare Other | Admitting: Family Medicine

## 2023-06-18 VITALS — BP 116/76 | HR 93 | Temp 98.4°F | Resp 14 | Ht 71.0 in | Wt 345.3 lb

## 2023-06-18 DIAGNOSIS — M5442 Lumbago with sciatica, left side: Secondary | ICD-10-CM

## 2023-06-18 DIAGNOSIS — M1A0710 Idiopathic chronic gout, right ankle and foot, without tophus (tophi): Secondary | ICD-10-CM | POA: Diagnosis not present

## 2023-06-18 DIAGNOSIS — G8929 Other chronic pain: Secondary | ICD-10-CM

## 2023-06-18 DIAGNOSIS — M48061 Spinal stenosis, lumbar region without neurogenic claudication: Secondary | ICD-10-CM | POA: Diagnosis not present

## 2023-06-18 DIAGNOSIS — Z6841 Body Mass Index (BMI) 40.0 and over, adult: Secondary | ICD-10-CM | POA: Diagnosis not present

## 2023-06-18 DIAGNOSIS — G4733 Obstructive sleep apnea (adult) (pediatric): Secondary | ICD-10-CM

## 2023-06-18 DIAGNOSIS — I8393 Asymptomatic varicose veins of bilateral lower extremities: Secondary | ICD-10-CM

## 2023-06-18 DIAGNOSIS — I4819 Other persistent atrial fibrillation: Secondary | ICD-10-CM | POA: Diagnosis not present

## 2023-06-18 DIAGNOSIS — M4807 Spinal stenosis, lumbosacral region: Secondary | ICD-10-CM

## 2023-06-18 MED ORDER — GABAPENTIN 300 MG PO CAPS: 300.0000 mg | ORAL_CAPSULE | Freq: Three times a day (TID) | ORAL | 3 refills | Status: DC

## 2023-06-18 NOTE — Assessment & Plan Note (Addendum)
Pain is constant and limiting his activities of daily living. This is his major concern. Was referred to pain management but has not yet followed up with them. Does not referral to neurology or spine specialist as he does not want surgery.  Gabapentin 300 mg TID Arthritis Tylenol BID  Pain management referral

## 2023-06-18 NOTE — Assessment & Plan Note (Signed)
Stable. No issues or concerns at this time. Continue allopurinol

## 2023-06-18 NOTE — Assessment & Plan Note (Signed)
Stable. Closely monitored by cardiology. No episodes of Afib recently.  Continue Eliquis and metoprolol

## 2023-06-18 NOTE — Assessment & Plan Note (Signed)
Sleep has improved nicely with the use of CPAP. He endorses feeling more well rested when he wakes up and overall feels as though he is getting a better quality of sleep.  Continue CPAP

## 2023-06-18 NOTE — Progress Notes (Signed)
Established Patient Office Visit  Subjective   Patient ID: Charles Floyd, male    DOB: [birth year]  Age: 72 y.o. MRN: [id number]  Chief Complaint  Patient presents with   Medical Management of Chronic Issues    Charles Floyd is here today for follow up management of chronic medical conditions. His major concern today is his chronic pain related to his sciatica. He was called by pain management but has had trouble scheduling an appointment. His CPAP machine has been going well. He feels as though he wakes up much more well rested. He is no longer having nightmares since he started using it and overall feels better since using it. He was seen by vascular surgery who evaluated the veins in his leg. There is no issue with flow and any treatments they have would be related to cosmetics. He is followed by [facility] for his Afib and has no concerns there. He has had no episodes of gout since his last visit. He has no other major questions or concerns today.     Review of Systems  Constitutional:  Positive for malaise/fatigue.  Cardiovascular:  Negative for palpitations.  Musculoskeletal:  Positive for back pain and joint pain.  Neurological:  Positive for tingling and weakness.      Objective:     BP 116/76 (BP Location: Left Arm, Patient Position: Sitting, Cuff Size: Large)   Pulse 93   Temp 98.4 F (36.9 C) (Oral)   Resp 14   Ht 5\' 11"  (1.803 m)   Wt (!) 345 lb 4.8 oz (156.6 kg)   SpO2 94%   BMI 48.16 kg/m  BP Readings from Last 3 Encounters:  06/18/23 116/76  05/09/23 123/79  03/29/23 112/82   Wt Readings from Last 3 Encounters:  06/18/23 (!) 345 lb 4.8 oz (156.6 kg)  05/09/23 (!) 345 lb (156.5 kg)  03/29/23 (!) 346 lb 6.4 oz (157.1 kg)    Physical Exam Constitutional:      Appearance: Normal appearance.  Cardiovascular:     Rate and Rhythm: Normal rate and regular rhythm.  Skin:    General: Skin is warm and dry.  Neurological:     General: No focal deficit present.      Mental Status: He is alert and oriented to person, place, and time.     No results found for any visits on 06/18/23.   The 10-year ASCVD risk score (Arnett DK, et al., 2019) is: 15.8%    Assessment & Plan:   Problem List Items Addressed This Visit       Cardiovascular and Mediastinum   Persistent atrial fibrillation (HCC)    Stable. Closely monitored by cardiology. No episodes of Afib recently.  Continue Eliquis and metoprolol      Varicose veins of both lower extremities    Seen by vascular surgery. Blood flow through veins is normal.  Management recs per vascular surgery        Respiratory   Obstructive sleep apnea - Primary    Sleep has improved nicely with the use of CPAP. He endorses feeling more well rested when he wakes up and overall feels as though he is getting a better quality of sleep.  Continue CPAP        Nervous and Auditory   Chronic left-sided low back pain with left-sided sciatica    Pain is constant and limiting his activities of daily living. This is his major concern. Was referred to pain management but has not yet followed up  with them. Does not referral to neurology or spine specialist as he does not want surgery.  Gabapentin 300 mg TID Arthritis Tylenol BID  Pain management referral      Relevant Medications   gabapentin (NEURONTIN) 300 MG capsule     Musculoskeletal and Integument   Foraminal stenosis of lumbar region     Other   Gout    Stable. No issues or concerns at this time. Continue allopurinol      Morbid obesity with BMI of 45.0-49.9, adult [name])    Wife concerned and wanted to discuss his weight as it is impacting his Afib and sleep. Charles Floyd is not concerned at this time and feels if his pain was better controlled he would be able to be more active.  Management of chronic pain      Spinal stenosis of lumbosacral region    Return in about 3 months (around 09/18/2023) for chronic disease f/u.    [name], Medical  Student  Patient seen along with MS3 student [name]. I personally evaluated this patient along with the student, and verified all aspects of the history, physical exam, and medical decision making as documented by the student. I agree with the student's documentation and have made all necessary edits.  [name], [name], MD, MPH [facility]

## 2023-06-18 NOTE — Assessment & Plan Note (Signed)
Seen by vascular surgery. Blood flow through veins is normal.  Management recs per vascular surgery

## 2023-06-18 NOTE — Assessment & Plan Note (Signed)
Wife concerned and wanted to discuss his weight as it is impacting his Afib and sleep. Charles Floyd is not concerned at this time and feels if his pain was better controlled he would be able to be more active.  Management of chronic pain

## 2023-07-17 ENCOUNTER — Encounter: Payer: Self-pay | Admitting: Family Medicine

## 2023-07-17 DIAGNOSIS — M48061 Spinal stenosis, lumbar region without neurogenic claudication: Secondary | ICD-10-CM

## 2023-07-17 DIAGNOSIS — M4807 Spinal stenosis, lumbosacral region: Secondary | ICD-10-CM

## 2023-07-19 NOTE — Telephone Encounter (Signed)
Ok to resend pain management referral

## 2023-08-20 DIAGNOSIS — Z789 Other specified health status: Secondary | ICD-10-CM | POA: Insufficient documentation

## 2023-08-20 DIAGNOSIS — M899 Disorder of bone, unspecified: Secondary | ICD-10-CM | POA: Insufficient documentation

## 2023-08-20 DIAGNOSIS — Z7901 Long term (current) use of anticoagulants: Secondary | ICD-10-CM | POA: Insufficient documentation

## 2023-08-20 DIAGNOSIS — G894 Chronic pain syndrome: Secondary | ICD-10-CM | POA: Insufficient documentation

## 2023-08-20 DIAGNOSIS — Z79899 Other long term (current) drug therapy: Secondary | ICD-10-CM | POA: Insufficient documentation

## 2023-08-20 NOTE — Progress Notes (Unsigned)
Patient: Charles Floyd  Service Category: E/M  Provider: Oswaldo Done, MD  DOB: May 13, 1951  DOS: 08/22/2023  Referring Provider: Erasmo Downer, MD  MRN: [id number]  Setting: Ambulatory outpatient  PCP: Erasmo Downer, MD  Type: New Patient  Specialty: Interventional Pain Management    Location: Office  Delivery: Face-to-face     Primary Reason(s) for Visit: Encounter for initial evaluation of one or more chronic problems (new to examiner) potentially causing chronic pain, and posing a threat to normal musculoskeletal function. (Level of risk: High) CC: No chief complaint on file.  HPI  Charles Floyd is a 72 y.o. year old, male patient, who comes for the first time to our practice referred by Erasmo Downer, MD for our initial evaluation of his chronic pain. He has History of kidney stones; History of prostate cancer; Vitamin D deficiency; Gout; Chronic left-sided low back pain with left-sided sciatica; Obstructive sleep apnea; Morbid obesity with BMI of 45.0-49.9, adult (HCC); Trigger middle finger of left hand; Trigger middle finger of right hand; History of colonic polyps; Sinusitis; Foraminal stenosis of lumbar region; Persistent atrial fibrillation (HCC); Spinal stenosis of lumbosacral region; Varicose veins of both lower extremities; Chronic anticoagulation (Eliquis); Chronic pain syndrome; Pharmacologic therapy; Disorder of skeletal system; and Problems influencing health status on their problem list. Today he comes in for evaluation of his No chief complaint on file.  Pain Assessment: Location:     Radiating:   Onset:   Duration:   Quality:   Severity:  /10 (subjective, self-reported pain score)  Effect on ADL:   Timing:   Modifying factors:   BP:    HR:    Onset and Duration: {Hx; Onset and Duration:210120511} Cause of pain: {Hx; Cause:210120521} Severity: {Pain Severity:210120502} Timing: {Symptoms; Timing:210120501} Aggravating Factors: {Causes;  Aggravating pain factors:210120507} Alleviating Factors: {Causes; Alleviating Factors:210120500} Associated Problems: {Hx; Associated problems:210120515} Quality of Pain: {Hx; Symptom quality or Descriptor:210120531} Previous Examinations or Tests: {Hx; Previous examinations or test:210120529} Previous Treatments: {Hx; Previous Treatment:210120503}  Charles Floyd is being evaluated for possible interventional pain management therapies for the treatment of his chronic pain.   ***  Charles Floyd has been informed that this initial visit was an evaluation only.  On the follow up appointment I will go over the results, including ordered tests and available interventional therapies. At that time he will have the opportunity to decide whether to proceed with offered therapies or not. In the event that [name] prefers avoiding interventional options, this will conclude our involvement in the case.  Medication management recommendations may be provided upon request.  Patient informed that diagnostic tests may be ordered to assist in identifying underlying causes, narrow the list of differential diagnoses and aid in determining candidacy for (or contraindications to) planned therapeutic interventions.  Historic Controlled Substance Pharmacotherapy Review  PMP and historical list of controlled substances: Gabapentin 300 mg capsule, 1 cap p.o. 3 times daily (90/month) (last filled on 06/18/2023) Most recently prescribed opioid analgesics:   None MME/day: 0 mg/day  Historical Monitoring: The patient  reports no history of drug use. List of prior UDS Testing: No results found for: "MDMA", "COCAINSCRNUR", "PCPSCRNUR", "PCPQUANT", "CANNABQUANT", "THCU", "ETH", "CBDTHCR", "D8THCCBX", "D9THCCBX" Historical Background Evaluation: [place] PMP: PDMP reviewed during this encounter. Review of the past 25-months conducted.             PMP NARX Score Report:  Narcotic: 000 Sedative: 000 Stimulant: 000 [place] Department of  public safety, offender search: Engineer, mining Information) Non-contributory Risk  Assessment Profile: Aberrant behavior: None observed or detected today Risk factors for fatal opioid overdose: None identified today PMP NARX Overdose Risk Score: 050 Fatal overdose hazard ratio (HR): Calculation deferred Non-fatal overdose hazard ratio (HR): Calculation deferred Risk of opioid abuse or dependence: 0.7-3.0% with doses <= 36 MME/day and 6.1-26% with doses >= 120 MME/day. Substance use disorder (SUD) risk level: See below Personal History of Substance Abuse (SUD-Substance use disorder):  Alcohol:    Illegal Drugs:    Rx Drugs:    ORT Risk Level calculation:    ORT Scoring interpretation table:  Score <3 = Low Risk for SUD  Score between 4-7 = Moderate Risk for SUD  Score >8 = High Risk for Opioid Abuse   PHQ-2 Depression Scale:  Total score:    PHQ-2 Scoring interpretation table: (Score and probability of major depressive disorder)  Score 0 = No depression  Score 1 = 15.4% Probability  Score 2 = 21.1% Probability  Score 3 = 38.4% Probability  Score 4 = 45.5% Probability  Score 5 = 56.4% Probability  Score 6 = 78.6% Probability   PHQ-9 Depression Scale:  Total score:    PHQ-9 Scoring interpretation table:  Score 0-4 = No depression  Score 5-9 = Mild depression  Score 10-14 = Moderate depression  Score 15-19 = Moderately severe depression  Score 20-27 = Severe depression (2.4 times higher risk of SUD and 2.89 times higher risk of overuse)   Pharmacologic Plan: As per protocol, I have not taken over any controlled substance management, pending the results of ordered tests and/or consults.            Initial impression: Pending review of available data and ordered tests.  Meds   Current Outpatient Medications:    acetaminophen (TYLENOL) 650 MG CR tablet, Take 1,300 mg by mouth every 8 (eight) hours as needed for pain., Disp: , Rfl:    allopurinol (ZYLOPRIM) 300 MG tablet, Take 1 tablet  (300 mg total) by mouth daily., Disp: 90 tablet, Rfl: 1   apixaban (ELIQUIS) 5 MG TABS tablet, Take 1 tablet (5 mg total) by mouth 2 (two) times daily., Disp: 180 tablet, Rfl: 0   gabapentin (NEURONTIN) 300 MG capsule, Take 1 capsule (300 mg total) by mouth 3 (three) times daily., Disp: 90 capsule, Rfl: 3   metoprolol succinate (TOPROL XL) 25 MG 24 hr tablet, Take 1 tablet (25 mg total) by mouth daily., Disp: 90 tablet, Rfl: 2  Imaging Review  Lumbosacral Imaging: Lumbar MR wo contrast: Results for orders placed during the hospital encounter of 01/20/20 MR LUMBAR SPINE WO CONTRAST  Narrative CLINICAL DATA:  Chronic left-sided back pain with left leg pain.  EXAM: MRI LUMBAR SPINE WITHOUT CONTRAST  TECHNIQUE: Multiplanar, multisequence MR imaging of the lumbar spine was performed. No intravenous contrast was administered.  COMPARISON:  Plain film Apr 12, 2016  FINDINGS: Segmentation:  Standard.  Alignment:  Physiologic.  Vertebrae:  No fracture, evidence of discitis, or bone lesion.  Conus medullaris and cauda equina: Conus extends to the T12-L1 level. Conus and cauda equina appear normal.  Paraspinal and other soft tissues: Negative.  Disc levels:  T12-L1: Mild facet degenerative changes. No spinal canal or neural foraminal stenosis.  L1-2: Symmetric disc bulge and mild facet degenerative changes resulting in mild spinal canal stenosis and mild bilateral neural foraminal narrowing.  L2-3: Disc bulge with superimposed left subarticular disc protrusion, facet degenerative changes and ligamentum flavum hypertrophy, resulting in moderate spinal canal stenosis effacement of  the left subarticular zone displacing the descending left L3 nerve root. There is also mild right and moderate left neural foraminal narrowing.  L3-4: Left asymmetric disc bulge, facet degenerative changes and ligamentum flavum hypertrophy resulting in severe spinal canal stenosis, mild right and  moderate left neural foraminal narrowing.  L4-5: Disc bulge, facet degenerative changes and ligamentum hypertrophy resulting in mild spinal canal stenosis, moderate to severe right and moderate left neural foraminal narrowing.  L5-S1: Disc bulge with superimposed central disc protrusion and facet degenerative changes, right greater than left, resulting in narrowing of the right subarticular zone, and moderate bilateral neural foraminal narrowing.  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  IMPRESSION: 1. Multilevel degenerative changes of the lumbar spine with severe multifactorial spinal canal stenosis at L3-4. 2. Moderate spinal canal stenosis at L2-3 with effacement of the left subarticular zone and impingement of the descending left L3 nerve root. 3. Mild spinal canal stenosis at L4-5 with moderate to severe right and moderate left neural foraminal narrowing. 4. Moderate bilateral neural foraminal narrowing at L5-S1 with narrowing of the right subarticular zone .   Electronically Signed By: Baldemar Lenis M.D. On: 01/20/2020 10:41  Lumbar DG (Complete) 4+V: Results for orders placed during the hospital encounter of 04/12/16 DG Lumbar Spine Complete  Narrative CLINICAL DATA:  Pain for 2-3 years.  No recent injury.  EXAM: LUMBAR SPINE - COMPLETE 4+ VIEW  COMPARISON:  10/17/2013  FINDINGS: Mild rightward scoliosis in the lumbar spine. Mild degenerative spurring. No fracture or subluxation. Disc spaces are maintained. SI joints are symmetric and unremarkable.  IMPRESSION: Mild rightward scoliosis with degenerative spurring. No acute findings.   Electronically Signed By: Charlett Nose M.D. On: 04/12/2016 09:40  Complexity Note: Imaging results reviewed.                         ROS  Cardiovascular: {Hx; Cardiovascular History:210120525} Pulmonary or Respiratory: {Hx; Pumonary and/or Respiratory  History:210120523} Neurological: {Hx; Neurological:210120504} Psychological-Psychiatric: {Hx; Psychological-Psychiatric History:210120512} Gastrointestinal: {Hx; Gastrointestinal:210120527} Genitourinary: {Hx; Genitourinary:210120506} Hematological: {Hx; Hematological:210120510} Endocrine: {Hx; Endocrine history:210120509} Rheumatologic: {Hx; Rheumatological:210120530} Musculoskeletal: {Hx; Musculoskeletal:210120528} Work History: {Hx; Work history:210120514}  Allergies  Mr. Llerenas has No Known Allergies.  Laboratory Chemistry Profile   Renal Lab Results  Component Value Date   BUN 16 03/16/2023   CREATININE 0.95 03/16/2023   BCR 17 03/16/2023   GFRAA >60 09/02/2019   GFRNONAA >60 09/02/2019     Electrolytes Lab Results  Component Value Date   NA 141 03/16/2023   K 4.6 03/16/2023   CL 100 03/16/2023   CALCIUM 9.4 03/16/2023     Hepatic Lab Results  Component Value Date   AST 17 03/16/2023   ALT 14 03/16/2023   ALBUMIN 3.7 (L) 03/16/2023   ALKPHOS 61 03/16/2023   AMYLASE 18 (L) 08/23/2017   LIPASE 5 (L) 08/23/2017     ID Lab Results  Component Value Date   SARSCOV2NAA POSITIVE (A) 01/03/2021     Bone Lab Results  Component Value Date   VD25OH 14.8 (L) 03/24/2016     Endocrine Lab Results  Component Value Date   GLUCOSE 99 03/16/2023   TSH 4.142 11/03/2019   FREET4 0.77 11/03/2019     Neuropathy No results found for: "VITAMINB12", "FOLATE", "HGBA1C", "HIV"   CNS No results found for: "COLORCSF", "APPEARCSF", "RBCCOUNTCSF", "WBCCSF", "POLYSCSF", "LYMPHSCSF", "EOSCSF", "PROTEINCSF", "GLUCCSF", "JCVIRUS", "CSFOLI", "IGGCSF", "LABACHR", "ACETBL"   Inflammation (CRP: Acute  ESR: Chronic) No results  found for: "CRP", "ESRSEDRATE", "LATICACIDVEN"   Rheumatology Lab Results  Component Value Date   LABURIC 7.2 03/16/2023     Coagulation Lab Results  Component Value Date   PLT 207 03/16/2023     Cardiovascular Lab Results  Component Value Date    HGB 14.6 03/16/2023   HCT 43.9 03/16/2023     Screening Lab Results  Component Value Date   SARSCOV2NAA POSITIVE (A) 01/03/2021     Cancer No results found for: "CEA", "CA125", "LABCA2"   Allergens No results found for: "ALMOND", "APPLE", "ASPARAGUS", "AVOCADO", "BANANA", "BARLEY", "BASIL", "BAYLEAF", "GREENBEAN", "LIMABEAN", "WHITEBEAN", "BEEFIGE", "REDBEET", "BLUEBERRY", "BROCCOLI", "CABBAGE", "MELON", "CARROT", "CASEIN", "CASHEWNUT", "CAULIFLOWER", "CELERY"     Note: Lab results reviewed.  PFSH  Drug: Mr. Richeson  reports no history of drug use. Alcohol:  reports current alcohol use. Tobacco:  reports that he has never smoked. He has never used smokeless tobacco. Medical:  has a past medical history of Arthritis, Asthma, Atrial fibrillation (HCC) (08/2019), Cancer of prostate (HCC) (2012), Gout, Gout, History of chicken pox, History of kidney stones, History of measles, History of mumps, Panic attacks, Panic attacks, and Sleep apnea. Family: family history includes Anemia in his mother; CAD in his father and mother; Cancer in his mother; Colon cancer in his mother; Congestive Heart Failure in his father; Emphysema in his father; Tuberculosis in his father.  Past Surgical History:  Procedure Laterality Date   CARDIOVERSION N/A 07/13/2021   Procedure: CARDIOVERSION;  Surgeon: Debbe Odea, MD;  Location: [facility] ORS;  Service: Cardiovascular;  Laterality: N/A;   CATARACT EXTRACTION Bilateral 04/2021   CHOLECYSTECTOMY N/A 09/18/2017   Procedure: LAPAROSCOPIC CHOLECYSTECTOMY WITH INTRAOPERATIVE CHOLANGIOGRAM;  Surgeon: Earline Mayotte, MD;  Location: [facility] ORS;  Service: General;  Laterality: N/A;   COLONOSCOPY  Jan 2003, 12-17-13   Dr Lemar Livings   COLONOSCOPY WITH PROPOFOL N/A 01/26/2021   Procedure: COLONOSCOPY WITH PROPOFOL;  Surgeon: Earline Mayotte, MD;  [facility] END[facility]COPY;  Service: Endoscopy;  Laterality: N/A;  COVID POSITIVE 01/03/2021   kidney stone extraction   07/2013   stent placed   PROSTATE SURGERY  2012   PROSTATECTOMY  05/16/2011   [facility]. Abdominal Laparoscopic, robotic assisted   Active Ambulatory Problems    Diagnosis Date Noted   History of kidney stones 03/15/2016   History of prostate cancer 03/15/2016   Vitamin D deficiency 03/15/2016   Gout 03/15/2016   Chronic left-sided low back pain with left-sided sciatica 03/23/2016   Obstructive sleep apnea 06/02/2016   Morbid obesity with BMI of 45.0-49.9, adult (HCC) 07/18/2017   Trigger middle finger of left hand 07/18/2017   Trigger middle finger of right hand 07/18/2017   History of colonic polyps 12/31/2018   Sinusitis 08/26/2019   Foraminal stenosis of lumbar region 03/29/2020   Persistent atrial fibrillation (HCC)    Spinal stenosis of lumbosacral region 03/16/2023   Varicose veins of both lower extremities 06/18/2023   Chronic anticoagulation (Eliquis) 08/20/2023   Chronic pain syndrome 08/20/2023   Pharmacologic therapy 08/20/2023   Disorder of skeletal system 08/20/2023   Problems influencing health status 08/20/2023   Resolved Ambulatory Problems    Diagnosis Date Noted   Encounter for screening colonoscopy 12/09/2013   Anxiety attack 03/15/2016   Umbilical hernia 03/15/2016   Personal history of urinary calculi 03/23/2016   Malignant neoplasm of prostate (HCC) 03/23/2016   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 In no apparent acute distress There were no vitals filed for this visit. BMI Assessment: Estimated body mass index is 48.16 kg/m as  calculated from the following:   Height as of 06/18/23: 5\' 11"  (1.803 m).   Weight as of 06/18/23: 345 lb 4.8 oz (156.6 kg).  BMI interpretation table: BMI level Category Range association with higher incidence of chronic pain  <18 kg/m2 Underweight   18.5-24.9 kg/m2 Ideal body weight   25-29.9 kg/m2 Overweight Increased incidence by 20%  30-34.9 kg/m2 Obese (Class I) Increased incidence by 68%  35-39.9 kg/m2 Severe obesity (Class II) Increased incidence by 136%  >40 kg/m2 Extreme obesity (Class III) Increased incidence by 254%   Patient's current BMI Ideal Body weight  There is no height or weight on file to calculate BMI. Patient weight not recorded   BMI Readings from Last 4 Encounters:  06/18/23 48.16 kg/m  05/09/23 48.12 kg/m  03/29/23 48.31 kg/m  03/16/23 49.09 kg/m   Wt Readings from Last 4 Encounters:  06/18/23 (!) 345 lb 4.8 oz (156.6 kg)  05/09/23 (!) 345 lb (156.5 kg)  03/29/23 (!) 346 lb 6.4 oz (157.1 kg)  03/16/23 (!) 352 lb (159.7 kg)    Psych/Mental status: Alert, oriented x 3 (person, place, & time)       Eyes: PERLA Respiratory: No evidence of acute respiratory distress  Assessment  Primary Diagnosis & Pertinent Problem List: The primary encounter diagnosis was Chronic pain syndrome. Diagnoses of Pharmacologic therapy, Disorder of skeletal system, Problems influencing health status, and Other secondary gout, unspecified chronicity, unspecified site were also pertinent to this visit.  Visit Diagnosis (New problems to examiner): 1. Chronic pain syndrome   2. Pharmacologic therapy   3. Disorder of skeletal system   4. Problems influencing health status   5. Other secondary gout, unspecified chronicity, unspecified site    Plan of Care (Initial workup plan)  Note: Mr. Obst was reminded that as per protocol, today's visit has been an evaluation only. We have not taken over the patient's controlled substance management.  Problem-specific plan: No  problem-specific Assessment & Plan notes found for this encounter.  Lab Orders  No laboratory test(s) ordered today   Imaging Orders  No imaging studies ordered today   Referral Orders  No referral(s) requested today   Procedure Orders    No procedure(s) ordered today   Pharmacotherapy (current): Medications ordered:  No orders of the defined types were placed in this encounter.  Medications administered during this visit: Francee Gentile "Stan" had no medications administered during this visit.   Analgesic Pharmacotherapy:  Opioid Analgesics: For patients currently taking or requesting to take opioid analgesics, in accordance with [facility] Guidelines, we will assess their risks and indications for the use of these substances. After completing our evaluation, we may offer recommendations, but we no longer take patients for medication management. The prescribing physician will ultimately decide, based on his/her training and level of comfort whether to adopt any of the recommendations, including whether or not to prescribe such medicines.  Membrane stabilizer:  To be determined at a later time  Muscle relaxant: To be determined at a later time  NSAID: To be determined at a later time  Other analgesic(s): To be determined at a later time   Interventional management options: Mr. Harker was informed that there is no guarantee that he would be a candidate for interventional therapies. The decision will be based on the results of diagnostic studies, as well as Mr. Leedom risk profile.  Procedure(s) under consideration:  Pending results of ordered studies      Interventional Therapies  Risk Factors  Considerations  Medical Comorbidities:  Eliquis anticoagulation: (Stop: 3 days  Restart:  hours )    Planned  Pending:      Under consideration:   Pending   Completed:   None at this time   Therapeutic  Palliative (PRN) options:   None established    Completed by other providers:   None reported      Provider-requested follow-up: No follow-ups on file.  Future Appointments  Date Time Provider Department Center  08/22/2023  9:00 AM Delano Metz, MD [facility] None  09/17/2023 11:00 AM Bacigalupo, [name], MD [facility]    Duration of encounter: *** minutes.  Total time on encounter, as per AMA guidelines included both the face-to-face and non-face-to-face time personally spent by the physician and/or other qualified health care professional(s) on the day of the encounter (includes time in activities that require the physician or other qualified health care professional and does not include time in activities normally performed by clinical staff). Physician's time may include the following activities when performed: Preparing to see the patient (e.g., pre-charting review of records, searching for previously ordered imaging, lab work, and nerve conduction tests) Review of prior analgesic pharmacotherapies. Reviewing PMP Interpreting ordered tests (e.g., lab work, imaging, nerve conduction tests) Performing post-procedure evaluations, including interpretation of diagnostic procedures Obtaining and/or reviewing separately obtained history Performing a medically appropriate examination and/or evaluation Counseling and educating the patient/family/caregiver Ordering medications, tests, or procedures Referring and communicating with other health care professionals (when not separately reported) Documenting clinical information in the electronic or other health record Independently interpreting results (not separately reported) and communicating results to the patient/ family/caregiver Care coordination (not separately reported)  Note by: Oswaldo Done, MD (TTS technology used. I apologize for any typographical errors that were not detected and corrected.) Date: 08/22/2023; Time: 12:34 PM

## 2023-08-20 NOTE — Patient Instructions (Signed)

## 2023-08-22 ENCOUNTER — Ambulatory Visit: Payer: Medicare Other | Attending: Pain Medicine | Admitting: Pain Medicine

## 2023-08-22 ENCOUNTER — Encounter: Payer: Self-pay | Admitting: Pain Medicine

## 2023-08-22 VITALS — BP 112/78 | HR 66 | Temp 98.0°F | Ht 71.0 in | Wt 345.0 lb

## 2023-08-22 DIAGNOSIS — R200 Anesthesia of skin: Secondary | ICD-10-CM | POA: Diagnosis not present

## 2023-08-22 DIAGNOSIS — M79604 Pain in right leg: Secondary | ICD-10-CM | POA: Diagnosis not present

## 2023-08-22 DIAGNOSIS — M4186 Other forms of scoliosis, lumbar region: Secondary | ICD-10-CM | POA: Insufficient documentation

## 2023-08-22 DIAGNOSIS — M25552 Pain in left hip: Secondary | ICD-10-CM | POA: Insufficient documentation

## 2023-08-22 DIAGNOSIS — Z7901 Long term (current) use of anticoagulants: Secondary | ICD-10-CM | POA: Insufficient documentation

## 2023-08-22 DIAGNOSIS — Z6841 Body Mass Index (BMI) 40.0 and over, adult: Secondary | ICD-10-CM | POA: Diagnosis not present

## 2023-08-22 DIAGNOSIS — M47817 Spondylosis without myelopathy or radiculopathy, lumbosacral region: Secondary | ICD-10-CM | POA: Insufficient documentation

## 2023-08-22 DIAGNOSIS — M1040 Other secondary gout, unspecified site: Secondary | ICD-10-CM | POA: Diagnosis not present

## 2023-08-22 DIAGNOSIS — M7918 Myalgia, other site: Secondary | ICD-10-CM | POA: Insufficient documentation

## 2023-08-22 DIAGNOSIS — M545 Low back pain, unspecified: Secondary | ICD-10-CM | POA: Insufficient documentation

## 2023-08-22 DIAGNOSIS — R937 Abnormal findings on diagnostic imaging of other parts of musculoskeletal system: Secondary | ICD-10-CM | POA: Diagnosis not present

## 2023-08-22 DIAGNOSIS — M25521 Pain in right elbow: Secondary | ICD-10-CM | POA: Insufficient documentation

## 2023-08-22 DIAGNOSIS — M2428 Disorder of ligament, vertebrae: Secondary | ICD-10-CM | POA: Insufficient documentation

## 2023-08-22 DIAGNOSIS — G8929 Other chronic pain: Secondary | ICD-10-CM | POA: Diagnosis not present

## 2023-08-22 DIAGNOSIS — Z789 Other specified health status: Secondary | ICD-10-CM | POA: Diagnosis not present

## 2023-08-22 DIAGNOSIS — Z79899 Other long term (current) drug therapy: Secondary | ICD-10-CM | POA: Insufficient documentation

## 2023-08-22 DIAGNOSIS — M79605 Pain in left leg: Secondary | ICD-10-CM | POA: Insufficient documentation

## 2023-08-22 DIAGNOSIS — M48061 Spinal stenosis, lumbar region without neurogenic claudication: Secondary | ICD-10-CM | POA: Insufficient documentation

## 2023-08-22 DIAGNOSIS — I482 Chronic atrial fibrillation, unspecified: Secondary | ICD-10-CM | POA: Insufficient documentation

## 2023-08-22 DIAGNOSIS — M899 Disorder of bone, unspecified: Secondary | ICD-10-CM | POA: Insufficient documentation

## 2023-08-22 DIAGNOSIS — M25522 Pain in left elbow: Secondary | ICD-10-CM | POA: Insufficient documentation

## 2023-08-22 DIAGNOSIS — E559 Vitamin D deficiency, unspecified: Secondary | ICD-10-CM | POA: Insufficient documentation

## 2023-08-22 DIAGNOSIS — G894 Chronic pain syndrome: Secondary | ICD-10-CM | POA: Insufficient documentation

## 2023-08-22 DIAGNOSIS — R202 Paresthesia of skin: Secondary | ICD-10-CM | POA: Diagnosis not present

## 2023-08-22 DIAGNOSIS — M5126 Other intervertebral disc displacement, lumbar region: Secondary | ICD-10-CM | POA: Insufficient documentation

## 2023-08-22 DIAGNOSIS — M109 Gout, unspecified: Secondary | ICD-10-CM | POA: Diagnosis not present

## 2023-08-22 NOTE — Progress Notes (Signed)
Safety precautions to be maintained throughout the outpatient stay will include: orient to surroundings, keep bed in low position, maintain call bell within reach at all times, provide assistance with transfer out of bed and ambulation.  

## 2023-08-23 LAB — COMP. METABOLIC PANEL (12)
AST: 22 IU/L (ref 0–40)
Albumin: 4 g/dL (ref 3.8–4.8)
Alkaline Phosphatase: 68 IU/L (ref 44–121)
BUN/Creatinine Ratio: 17 (ref 10–24)
BUN: 16 mg/dL (ref 8–27)
Bilirubin Total: 0.7 mg/dL (ref 0.0–1.2)
Calcium: 9.1 mg/dL (ref 8.6–10.2)
Chloride: 101 mmol/L (ref 96–106)
Creatinine, Ser: 0.92 mg/dL (ref 0.76–1.27)
Globulin, Total: 3 g/dL (ref 1.5–4.5)
Glucose: 96 mg/dL (ref 70–99)
Potassium: 4.5 mmol/L (ref 3.5–5.2)
Sodium: 143 mmol/L (ref 134–144)
Total Protein: 7 g/dL (ref 6.0–8.5)
eGFR: 88 mL/min/{1.73_m2} (ref >59)

## 2023-08-23 LAB — C-REACTIVE PROTEIN: CRP: 3 mg/L (ref 0–10)

## 2023-08-23 LAB — URIC ACID: Uric Acid: 7.4 mg/dL (ref 3.8–8.4)

## 2023-08-23 LAB — 25-HYDROXY VITAMIN D LCMS D2+D3

## 2023-08-23 LAB — URIC ACID, RANDOM URINE: Uric Acid, Ur: 64.3 mg/dL

## 2023-08-23 LAB — VITAMIN B12: Vitamin B-12: 312 pg/mL (ref 232–1245)

## 2023-08-23 LAB — SEDIMENTATION RATE: Sed Rate: 16 mm/hr (ref 0–30)

## 2023-08-23 LAB — MAGNESIUM: Magnesium: 1.8 mg/dL (ref 1.6–2.3)

## 2023-08-27 ENCOUNTER — Ambulatory Visit
Admission: RE | Admit: 2023-08-27 | Discharge: 2023-08-27 | Disposition: A | Discharge status: Home / Self Care | Payer: Medicare Other | Source: Ambulatory Visit | Attending: Pain Medicine | Admitting: Pain Medicine

## 2023-08-27 ENCOUNTER — Ambulatory Visit
Admission: RE | Admit: 2023-08-27 | Discharge: 2023-08-27 | Disposition: A | Discharge status: Home / Self Care | Payer: Medicare Other | Attending: Pain Medicine | Admitting: Pain Medicine

## 2023-08-27 DIAGNOSIS — R202 Paresthesia of skin: Secondary | ICD-10-CM | POA: Diagnosis not present

## 2023-08-27 DIAGNOSIS — M25521 Pain in right elbow: Secondary | ICD-10-CM

## 2023-08-27 DIAGNOSIS — M79605 Pain in left leg: Secondary | ICD-10-CM | POA: Diagnosis not present

## 2023-08-27 DIAGNOSIS — M545 Low back pain, unspecified: Secondary | ICD-10-CM | POA: Diagnosis not present

## 2023-08-27 DIAGNOSIS — M25522 Pain in left elbow: Secondary | ICD-10-CM | POA: Diagnosis not present

## 2023-08-27 DIAGNOSIS — G8929 Other chronic pain: Secondary | ICD-10-CM

## 2023-08-27 DIAGNOSIS — M25551 Pain in right hip: Secondary | ICD-10-CM | POA: Diagnosis not present

## 2023-08-27 DIAGNOSIS — R937 Abnormal findings on diagnostic imaging of other parts of musculoskeletal system: Secondary | ICD-10-CM

## 2023-08-27 DIAGNOSIS — M25552 Pain in left hip: Secondary | ICD-10-CM | POA: Insufficient documentation

## 2023-08-27 DIAGNOSIS — M79604 Pain in right leg: Secondary | ICD-10-CM | POA: Diagnosis not present

## 2023-08-27 DIAGNOSIS — R200 Anesthesia of skin: Secondary | ICD-10-CM | POA: Diagnosis not present

## 2023-08-27 DIAGNOSIS — M160 Bilateral primary osteoarthritis of hip: Secondary | ICD-10-CM | POA: Diagnosis not present

## 2023-08-27 DIAGNOSIS — M47816 Spondylosis without myelopathy or radiculopathy, lumbar region: Secondary | ICD-10-CM | POA: Diagnosis not present

## 2023-08-27 DIAGNOSIS — M7918 Myalgia, other site: Secondary | ICD-10-CM | POA: Insufficient documentation

## 2023-08-29 LAB — COMPLIANCE DRUG ANALYSIS, UR

## 2023-09-10 NOTE — Progress Notes (Unsigned)
PROVIDER NOTE: Information contained herein reflects review and annotations entered in association with encounter. Interpretation of such information and data should be left to medically-trained personnel. Information provided to patient can be located elsewhere in the medical record under "Patient Instructions". Document created using STT-dictation technology, any transcriptional errors that may result from process are unintentional.    Patient: Charles Floyd  Service Category: E/M  Provider: Oswaldo Done, MD  DOB: 07-15-1951  DOS: 09/12/2023  Referring Provider: Erasmo Downer, MD  MRN: [id number]  Specialty: Interventional Pain Management  PCP: Erasmo Downer, MD  Type: Established Patient  Setting: Ambulatory outpatient    Location: Office  Delivery: Face-to-face     Primary Reason(s) for Visit: Encounter for evaluation before starting new chronic pain management plan of care (Level of risk: moderate) CC: No chief complaint on file.  HPI  Charles Floyd is a 72 y.o. year old, male patient, who comes today for a follow-up evaluation to review the test results and decide on a treatment plan. 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); Varicose veins of lower extremities (Bilateral); Chronic anticoagulation (Eliquis); Chronic pain syndrome; Pharmacologic therapy; Disorder of skeletal system; Problems influencing health status; Abnormal MRI, lumbar spine (01/20/2020); Lumbar lateral recess stenosis  (Left: L2-3) (Right: L5-S1); Lumbosacral facet arthropathy (Multilevel) (Bilateral); Ligamentum flavum hypertrophy (L2-3, L3-4, L4-5); Lumbar intervertebral disc protrusion (Left: L2-3) (Central: L5-S1); Dextroscoliosis of lumbar spine; Class 3 severe obesity with serious comorbidity and body mass index (BMI) of 45.0 to 49.9 in adult [facility]); Chronic gluteal pain (1ry area of Pain) (Left); Chronic lower extremity pain (2ry area of Pain) (Bilateral) (L>R); Chronic low back pain (3ry area of Pain) (Bilateral) (L>R) w/o sciatica; Numbness and tingling of lower extremity (intermittent) (Bilateral); Numbness and tingling in hands (intermittent) (Bilateral); Chronic atrial fibrillation (HCC); Chronic elbow pain (4th area of Pain) (Bilateral) (R>L); and Gout of big toe (Left) on their problem list. His primarily concern today is the No chief complaint on file.  Pain Assessment: Location:     Radiating:   Onset:   Duration:   Quality:   Severity:  /10 (subjective, self-reported pain score)  Effect on ADL:   Timing:   Modifying factors:   BP:    HR:    Charles Floyd comes in today for a follow-up visit after his initial evaluation on 08/22/2023. Today we went over the results of his tests. These were explained in "Layman's terms". During today's appointment we went over my diagnostic impression, as well as the proposed treatment plan.  Review of initial evaluation (08/22/2023): "According to the patient the primary area of pain is that of the buttocks (Left).  He denies any prior surgeries, x-rays, physical therapy, or nerve blocks in that area.   The patient's secondary area pain is that of the lower extremities (Bilateral) (L>R).  Again he denies any prior surgeries, recent x-rays, nerve conduction test, physical therapy, or nerve blocks.  The patient does indicate having bilateral lower extremity numbness that primarily will affect him whenever he sits for a prolonged period of time.  He describes the  numbness to go all the way down into his feet and is seems to be generalized.   The patient's third area pain is out of the lower back (Bilateral) (L>R).  He denies any prior surgeries, recent x-rays, physical therapy, but does indicate having had some nerve blocks in the past.  Review of the medical record indicates the patient to have had:   Therapeutic left L3-4 TFESI x1 (04/26/2020) by Filomena Jungling, MD [facility] PMR)  Therapeutic left L4-5 TFESI x2 (02/16/2020, 04/26/2020) by Filomena Jungling, MD [facility] PMR)  Therapeutic left L5-S1 TFESI x2 (02/16/2020, 08/18/2021) by Filomena Jungling, MD [facility] PMR)  Therapeutic left IA hip injection x1 (04/05/2020) by Filomena Jungling, MD [facility] PMR)    The patient's fourth area pain is that of the elbows (Bilateral) (R>L).  He denies any surgeries, x-rays, physical therapy, nerve blocks, or joint injections in the area of the elbows.  He does indicate having intermittent bilateral hand numbness secondary to the use of his 2 canes.  He refers the numbness to primarily be in the palm of his hands bilaterally with the right being equal to the left.   Analgesic pharmacotherapy: APAP 650 mg, 2 tab p.o. every 8 hours (3900 mg/day), gabapentin 300 mg capsule, 1 cap p.o. 3 times daily (900 mg/day).   Medical comorbidities: The patient is morbidly obese with a weight of 345 pounds (BMI 48.12 kg/m) and he ambulates using a wheelchair or bilateral canes.  He will experience exertional shortness of breath, has a history of bronchial asthma, and atrial fibrillation which did not respond to cardioversion in 2022 and is currently being treated with metoprolol and Eliquis anticoagulation.  He does indicate that in the past he has had the Eliquis stopped for procedures.  In addition, the patient has a 2015 history of nephrolithiasis and a 2012 history of prostate cancer which was treated with laser ablation.  He denies any chemotherapy or radiation therapy.  He refers that he is currently on  remission and every year he has it checked through his PSA.  The patient also has a history of obstructive sleep apnea and he uses a CPAP machine which he indicates did not use for the past week secondary to having been on vacation.  In addition to the above, the patient also has a history of gout which usually will affect his left big toe and he treats this with the PRN use of allopurinol for approximately 7 to 10 days.  The rest of the time he tries to avoid triggering foods (diet controlled)."  Review of diagnostic tests ordered on 08/22/2023: Lab work was within normal limits except for a vitamin D level demonstrating a vitamin D deficiency of 12 ng/mL (normal 30-100).  Diagnostic x-rays of both elbows, both hips, and lumbar spine were completed on 08/27/2023 but unfortunately the official reports are not available as of 09/11/2023.  ***  Patient presented with interventional treatment options. Mr. Walth was informed that I will not be providing medication management. Pharmacotherapy evaluation including recommendations may be offered, if specifically requested.   Controlled Substance Pharmacotherapy Assessment REMS (Risk Evaluation and Mitigation Strategy)  Opioid Analgesic: None MME/day: 0 mg/day  Pill Count: None expected due to no prior prescriptions written by our practice. No notes on file Pharmacokinetics: Liberation and absorption (onset of action): WNL Distribution (time to peak effect): WNL Metabolism and excretion (duration of action): WNL         Pharmacodynamics: Desired effects: Analgesia: Mr. Tagg reports >  50% benefit. Functional ability: Patient reports that medication allows him to accomplish basic ADLs Clinically meaningful improvement in function (CMIF): Sustained CMIF goals met Perceived effectiveness: Described as relatively effective, allowing for increase in activities of daily living (ADL) Undesirable effects: Side-effects or Adverse reactions: None  reported Monitoring: [place] PMP: PDMP reviewed during this encounter. Online review of the past 68-month period previously conducted. Not applicable at this point since we have not taken over the patient's medication management yet. List of other Serum/Urine Drug Screening Test(s):  No results found for: "AMPHSCRSER", "BARBSCRSER", "BENZOSCRSER", "COCAINSCRSER", "COCAINSCRNUR", "PCPSCRSER", "THCSCRSER", "THCU", "CANNABQUANT", "OPIATESCRSER", "OXYSCRSER", "PROPOXSCRSER", "ETH", "CBDTHCR", "D8THCCBX", "D9THCCBX" List of all UDS test(s) done:  Lab Results  Component Value Date   SUMMARY Note 08/22/2023   Last UDS on record: Summary  Date Value Ref Range Status  08/22/2023 Note  Final    Comment:    ==================================================================== Compliance Drug Analysis, Ur ==================================================================== Test                             Result       Flag       Units  Drug Present and Declared for Prescription Verification   Gabapentin                     PRESENT      EXPECTED   Acetaminophen                  PRESENT      EXPECTED  Drug Absent but Declared for Prescription Verification   Metoprolol                     Not Detected UNEXPECTED ==================================================================== Test                      Result    Flag   Units      Ref Range   Creatinine              102              mg/dL      >=24 ==================================================================== Declared Medications:  The flagging and interpretation on this report are based on the  following declared medications.  Unexpected results may arise from  inaccuracies in the declared medications.   **Note: The testing scope of this panel includes these medications:   Gabapentin (Neurontin)  Metoprolol (Toprol)   **Note: The testing scope of this panel does not include small to  moderate amounts of these reported medications:    Acetaminophen (Tylenol)   **Note: The testing scope of this panel does not include the  following reported medications:   Allopurinol (Zyloprim)  Apixaban (Eliquis) ==================================================================== For clinical consultation, please call [phone]. ====================================================================    UDS interpretation: No unexpected findings.          Medication Assessment Form: Not applicable. No opioids. Treatment compliance: Not applicable Risk Assessment Profile: Aberrant behavior: See initial evaluations. None observed or detected today Comorbid factors increasing risk of overdose: See initial evaluation. No additional risks detected today Opioid risk tool (ORT):     08/22/2023    9:03 AM  Opioid Risk   Alcohol 0  Illegal Drugs 0  Rx Drugs 0  Alcohol 0  Illegal Drugs 0  Rx Drugs 0  Age between 16-45 years  0  History of Preadolescent Sexual Abuse 0  Psychological Disease 0  Depression  1  Opioid Risk Tool Scoring 1  Opioid Risk Interpretation Low Risk    ORT Scoring interpretation table:  Score <3 = Low Risk for SUD  Score between 4-7 = Moderate Risk for SUD  Score >8 = High Risk for Opioid Abuse   Risk of substance use disorder (SUD): Low  Risk Mitigation Strategies:  Patient opioid safety counseling: No controlled substances prescribed. Patient-Prescriber Agreement (PPA): No agreement signed.  Controlled substance notification to other providers: None required. No opioid therapy.  Pharmacologic Plan: Non-opioid analgesic therapy offered. Interventional alternatives discussed.             Laboratory Chemistry Profile   Renal Lab Results  Component Value Date   BUN 16 08/22/2023   CREATININE 0.92 08/22/2023   BCR 17 08/22/2023   GFRAA >60 09/02/2019   GFRNONAA >60 09/02/2019     Electrolytes Lab Results  Component Value Date   NA 143 08/22/2023   K 4.5 08/22/2023   CL 101 08/22/2023    CALCIUM 9.1 08/22/2023   MG 1.8 08/22/2023     Hepatic Lab Results  Component Value Date   AST 22 08/22/2023   ALT 14 03/16/2023   ALBUMIN 4.0 08/22/2023   ALKPHOS 68 08/22/2023   AMYLASE 18 (L) 08/23/2017   LIPASE 5 (L) 08/23/2017     ID Lab Results  Component Value Date   SARSCOV2NAA POSITIVE (A) 01/03/2021     Bone Lab Results  Component Value Date   VD25OH 14.8 (L) 03/24/2016   25OHVITD1 12 (L) 08/22/2023   25OHVITD2 1.0 08/22/2023   25OHVITD3 11 08/22/2023     Endocrine Lab Results  Component Value Date   GLUCOSE 96 08/22/2023   TSH 4.142 11/03/2019   FREET4 0.77 11/03/2019     Neuropathy Lab Results  Component Value Date   VITAMINB12 312 08/22/2023     CNS No results found for: "COLORCSF", "APPEARCSF", "RBCCOUNTCSF", "WBCCSF", "POLYSCSF", "LYMPHSCSF", "EOSCSF", "PROTEINCSF", "GLUCCSF", "JCVIRUS", "CSFOLI", "IGGCSF", "LABACHR", "ACETBL"   Inflammation (CRP: Acute  ESR: Chronic) Lab Results  Component Value Date   CRP 3 08/22/2023   ESRSEDRATE 16 08/22/2023     Rheumatology Lab Results  Component Value Date   LABURIC 7.4 08/22/2023   URICUR 64.3 08/22/2023     Coagulation Lab Results  Component Value Date   PLT 207 03/16/2023     Cardiovascular Lab Results  Component Value Date   HGB 14.6 03/16/2023   HCT 43.9 03/16/2023     Screening Lab Results  Component Value Date   SARSCOV2NAA POSITIVE (A) 01/03/2021     Cancer No results found for: "CEA", "CA125", "LABCA2"   Allergens No results found for: "ALMOND", "APPLE", "ASPARAGUS", "AVOCADO", "BANANA", "BARLEY", "BASIL", "BAYLEAF", "GREENBEAN", "LIMABEAN", "WHITEBEAN", "BEEFIGE", "REDBEET", "BLUEBERRY", "BROCCOLI", "CABBAGE", "MELON", "CARROT", "CASEIN", "CASHEWNUT", "CAULIFLOWER", "CELERY"     Note: Lab results reviewed.  Recent Diagnostic Imaging Review  Cervical Imaging: Cervical MR wo contrast: No results found for this or any previous visit.  Cervical MR wo contrast: No valid  procedures specified. Cervical CT wo contrast: No results found for this or any previous visit.  Cervical DG Bending/F/E views: No results found for this or any previous visit.   Shoulder Imaging: Shoulder-R MR wo contrast: No results found for this or any previous visit.  Shoulder-L MR wo contrast: No results found for this or any previous visit.  Shoulder-R DG: No results found for this or any previous visit.  Shoulder-L DG: No results found for this or any previous  visit.   Thoracic Imaging: Thoracic MR wo contrast: No results found for this or any previous visit.  Thoracic MR wo contrast: No valid procedures specified. Thoracic CT wo contrast: No results found for this or any previous visit.  Thoracic DG 4 views: No results found for this or any previous visit.  Thoracic DG w/swimmers view: No results found for this or any previous visit.   Lumbosacral Imaging: Lumbar MR wo contrast: Results for orders placed during the hospital encounter of 01/20/20  MR LUMBAR SPINE WO CONTRAST  Narrative CLINICAL DATA:  Chronic left-sided back pain with left leg pain.  EXAM: MRI LUMBAR SPINE WITHOUT CONTRAST  TECHNIQUE: Multiplanar, multisequence MR imaging of the lumbar spine was performed. No intravenous contrast was administered.  COMPARISON:  Plain film Apr 12, 2016  FINDINGS: Segmentation:  Standard.  Alignment:  Physiologic.  Vertebrae:  No fracture, evidence of discitis, or bone lesion.  Conus medullaris and cauda equina: Conus extends to the T12-L1 level. Conus and cauda equina appear normal.  Paraspinal and other soft tissues: Negative.  Disc levels:  T12-L1: Mild facet degenerative changes. No spinal canal or neural foraminal stenosis.  L1-2: Symmetric disc bulge and mild facet degenerative changes resulting in mild spinal canal stenosis and mild bilateral neural foraminal narrowing.  L2-3: Disc bulge with superimposed left subarticular  disc protrusion, facet degenerative changes and ligamentum flavum hypertrophy, resulting in moderate spinal canal stenosis effacement of the left subarticular zone displacing the descending left L3 nerve root. There is also mild right and moderate left neural foraminal narrowing.  L3-4: Left asymmetric disc bulge, facet degenerative changes and ligamentum flavum hypertrophy resulting in severe spinal canal stenosis, mild right and moderate left neural foraminal narrowing.  L4-5: Disc bulge, facet degenerative changes and ligamentum hypertrophy resulting in mild spinal canal stenosis, moderate to severe right and moderate left neural foraminal narrowing.  L5-S1: Disc bulge with superimposed central disc protrusion and facet degenerative changes, right greater than left, resulting in narrowing of the right subarticular zone, and moderate bilateral neural foraminal narrowing.  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  IMPRESSION: 1. Multilevel degenerative changes of the lumbar spine with severe multifactorial spinal canal stenosis at L3-4. 2. Moderate spinal canal stenosis at L2-3 with effacement of the left subarticular zone and impingement of the descending left L3 nerve root. 3. Mild spinal canal stenosis at L4-5 with moderate to severe right and moderate left neural foraminal narrowing. 4. Moderate bilateral neural foraminal narrowing at L5-S1 with narrowing of the right subarticular zone .   Electronically Signed By: Baldemar Lenis M.D. On: 01/20/2020 10:41  Lumbar MR wo contrast: No valid procedures specified. Lumbar CT wo contrast: No results found for this or any previous visit.  Lumbar DG Bending views: No results found for this or any previous visit.         Sacroiliac Joint Imaging: Sacroiliac Joint DG: No results found for this or any previous visit.   Hip Imaging: Hip-R MR wo contrast: No results found for this or any  previous visit.  Hip-L MR wo contrast: No results found for this or any previous visit.  Hip-R CT wo contrast: No results found for this or any previous visit.  Hip-L CT wo contrast: No results found for this or any previous visit.  Hip-R DG 2-3 views: No results found for this or any previous visit.  Hip-L DG 2-3 views: No results found for this or any previous visit.  Hip-B DG  Bilateral: No results found for this or any previous visit.  Hip-B DG Bilateral (5V): No results found for this or any previous visit.   Knee Imaging: Knee-R MR wo contrast: No results found for this or any previous visit.  Knee-L MR wo contrast: No results found for this or any previous visit.  Knee-R CT wo contrast: No results found for this or any previous visit.  Knee-L CT wo contrast: No results found for this or any previous visit.  Knee-R DG 4 views: No results found for this or any previous visit.  Knee-L DG 4 views: No results found for this or any previous visit.   Ankle Imaging: Ankle-R DG Complete: No results found for this or any previous visit.  Ankle-L DG Complete: No results found for this or any previous visit.   Foot Imaging: Foot-R DG Complete: No results found for this or any previous visit.  Foot-L DG Complete: No results found for this or any previous visit.   Elbow Imaging: Elbow-R DG Complete: No results found for this or any previous visit.  Elbow-L DG Complete: No results found for this or any previous visit.   Wrist Imaging: Wrist-R DG Complete: No results found for this or any previous visit.  Wrist-L DG Complete: No results found for this or any previous visit.   Hand Imaging: Hand-R DG Complete: No results found for this or any previous visit.  Hand-L DG Complete: No results found for this or any previous visit.   Complexity Note: Imaging results reviewed.                         Meds   Current Outpatient Medications:    acetaminophen (TYLENOL)  650 MG CR tablet, Take 1,300 mg by mouth every 8 (eight) hours as needed for pain., Disp: , Rfl:    allopurinol (ZYLOPRIM) 300 MG tablet, Take 1 tablet (300 mg total) by mouth daily., Disp: 90 tablet, Rfl: 1   apixaban (ELIQUIS) 5 MG TABS tablet, Take 1 tablet (5 mg total) by mouth 2 (two) times daily., Disp: 180 tablet, Rfl: 0   gabapentin (NEURONTIN) 300 MG capsule, Take 1 capsule (300 mg total) by mouth 3 (three) times daily., Disp: 90 capsule, Rfl: 3   metoprolol succinate (TOPROL XL) 25 MG 24 hr tablet, Take 1 tablet (25 mg total) by mouth daily., Disp: 90 tablet, Rfl: 2  ROS  Constitutional: Denies any fever or chills Gastrointestinal: No reported hemesis, hematochezia, vomiting, or acute GI distress Musculoskeletal: Denies any acute onset joint swelling, redness, loss of ROM, or weakness Neurological: No reported episodes of acute onset apraxia, aphasia, dysarthria, agnosia, amnesia, paralysis, loss of coordination, or loss of consciousness  Allergies  Mr. Swoveland has No Known Allergies.  PFSH  Drug: Mr. Delia  reports no history of drug use. Alcohol:  reports current alcohol use. Tobacco:  reports that he has never smoked. He has never used smokeless tobacco. Medical:  has a past medical history of Arthritis, Asthma, Atrial fibrillation (HCC) (08/2019), Cancer of prostate (HCC) (2012), Gout, Gout, History of chicken pox, History of kidney stones, History of measles, History of mumps, Panic attacks, Panic attacks, and Sleep apnea. Surgical: Mr. Mollenkopf  has a past surgical history that includes Colonoscopy (Jan 2003, 12-17-13); kidney stone extraction (07/2013); Prostatectomy (05/16/2011); Prostate surgery (2012); Cholecystectomy (N/A, 09/18/2017); Colonoscopy with propofol (N/A, 01/26/2021); Cardioversion (N/A, 07/13/2021); and Cataract extraction (Bilateral, 04/2021). Family: family history includes Anemia in his mother; CAD  in his father and mother; Cancer in his mother; Colon cancer in  his mother; Congestive Heart Failure in his father; Emphysema in his father; Tuberculosis in his father.  Constitutional Exam  General appearance: Well nourished, well developed, and well hydrated. In no apparent acute distress There were no vitals filed for this visit. BMI Assessment: Estimated body mass index is 48.12 kg/m as calculated from the following:   Height as of 08/22/23: 5\' 11"  (1.803 m).   Weight as of 08/22/23: 345 lb (156.5 kg).  BMI interpretation table: BMI level Category Range association with higher incidence of chronic pain  <18 kg/m2 Underweight   18.5-24.9 kg/m2 Ideal body weight   25-29.9 kg/m2 Overweight Increased incidence by 20%  30-34.9 kg/m2 Obese (Class I) Increased incidence by 68%  35-39.9 kg/m2 Severe obesity (Class II) Increased incidence by 136%  >40 kg/m2 Extreme obesity (Class III) Increased incidence by 254%   Patient's current BMI Ideal Body weight  There is no height or weight on file to calculate BMI. Patient weight not recorded   BMI Readings from Last 4 Encounters:  08/22/23 48.12 kg/m  06/18/23 48.16 kg/m  05/09/23 48.12 kg/m  03/29/23 48.31 kg/m   Wt Readings from Last 4 Encounters:  08/22/23 (!) 345 lb (156.5 kg)  06/18/23 (!) 345 lb 4.8 oz (156.6 kg)  05/09/23 (!) 345 lb (156.5 kg)  03/29/23 (!) 346 lb 6.4 oz (157.1 kg)    Psych/Mental status: Alert, oriented x 3 (person, place, & time)       Eyes: PERLA Respiratory: No evidence of acute respiratory distress  Assessment & Plan  Primary Diagnosis & Pertinent Problem List: The primary encounter diagnosis was Chronic pain syndrome. 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  Visit Diagnosis: 1. Chronic pain syndrome   2. Chronic gluteal pain (1ry area of Pain) (Left)   3. Chronic lower extremity pain (2ry area of Pain) (Bilateral) (L>R)   4. Chronic low back pain (3ry area of Pain) (Bilateral) (L>R) w/o sciatica   5. Chronic elbow pain (4th area of Pain) (Bilateral) (R>L)   6. Ligamentum flavum hypertrophy (L2-3, L3-4, L4-5)   7. Lumbar foraminal stenosis (Bilateral: L1 to, L2-3, L3-4, L4-5, L5-S1)   8. Lumbar intervertebral disc protrusion (Left: L2-3) (Central: L5-S1)   9. Lumbar lateral recess stenosis (Left: L2-3) (Right: L5-S1)   10. 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 Lumbosacral facet arthropathy (Multilevel) (Bilateral)   12. Abnormal MRI, lumbar spine (01/20/2020)    Problems updated and reviewed during this visit: No problems updated.  Plan of Care  Pharmacotherapy (Medications Ordered): No orders of the defined types were placed in this encounter.  Procedure Orders    No procedure(s) ordered today   Lab Orders  No laboratory test(s) ordered today   Imaging Orders  No imaging studies ordered today   Referral Orders  No referral(s) requested today    Pharmacological management:  Opioid Analgesics:  I will not be prescribing any opioids at this time Membrane stabilizer: I will not be prescribing any at this time Muscle relaxant: I will not be prescribing any at this time NSAID: I will not be prescribing any at this time Other analgesic(s): I will not be prescribing any at this time      Interventional Therapies  Risk Factors  Considerations   Medical Comorbidities:  Eliquis Anticoagulation: (Stop: 3 days  Restart: 6 hours ) 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 Cardioversion)  Anxiety  panic attacks  Hx. Nephrolithiasis  Hx prostate cancer     Planned  Pending:      Under consideration:   Pending   Completed:   None at this time   Therapeutic  Palliative (PRN) options:   None established   Completed by other providers:   Therapeutic left L3-4 TFESI x1 (04/26/2020) by Filomena Jungling, MD [facility])  Therapeutic left L4-5 TFESI x2 (02/16/2020, 04/26/2020) by Filomena Jungling, MD [facility]R)  Therapeutic left L5-S1 TFESI x2 (02/16/2020, 08/18/2021) by Filomena Jungling, MD [facility]R)  Therapeutic left IA hip injection x1 (04/05/2020) by Filomena Jungling, MD [facility] PMR)         Provider-requested follow-up: No follow-ups on file. Recent Visits Date Type Provider Dept  08/22/23 Office Visit Delano Metz, MD [facility]  Showing recent visits within past 90 days and meeting all other requirements Future Appointments Date Type Provider Dept  09/12/23 Appointment Delano Metz, MD [facility]  Showing future appointments within next 90 days and meeting all other requirements   Primary Care Physician: Erasmo Downer, MD  Duration of encounter: *** minutes.  Total time on encounter, as per AMA guidelines included both the face-to-face and non-face-to-face time personally spent by the physician and/or other qualified health care professional(s) on the day of the encounter (includes time in activities that require the physician or other qualified health care professional and does not include time in activities normally performed by clinical staff). Physician's time may include the following activities when performed: Preparing to see the patient (e.g., pre-charting review of records, searching for previously ordered imaging, lab work, and nerve conduction tests) Review of prior analgesic  pharmacotherapies. Reviewing PMP Interpreting ordered tests (e.g., lab work, imaging, nerve conduction tests) Performing post-procedure evaluations, including interpretation of diagnostic procedures Obtaining and/or reviewing separately obtained history Performing a medically appropriate examination and/or evaluation Counseling and educating the patient/family/caregiver Ordering medications, tests, or procedures Referring and communicating with other health care professionals (when not separately reported) Documenting clinical information in the electronic or other health record Independently interpreting results (not separately reported) and communicating results to the patient/ family/caregiver Care coordination (not separately reported)  Note by: Oswaldo Done, MD (TTS technology used. I apologize for any typographical errors that were not detected and corrected.) Date: 09/12/2023; Time: 5:31 PM

## 2023-09-12 ENCOUNTER — Other Ambulatory Visit: Payer: Self-pay | Admitting: Family Medicine

## 2023-09-12 ENCOUNTER — Encounter: Payer: Self-pay | Admitting: Pain Medicine

## 2023-09-12 ENCOUNTER — Ambulatory Visit: Payer: Medicare Other | Attending: Pain Medicine | Admitting: Pain Medicine

## 2023-09-12 VITALS — BP 106/76 | HR 64 | Temp 97.3°F | Resp 16 | Ht 71.0 in | Wt 345.0 lb

## 2023-09-12 DIAGNOSIS — M25521 Pain in right elbow: Secondary | ICD-10-CM | POA: Diagnosis not present

## 2023-09-12 DIAGNOSIS — M47817 Spondylosis without myelopathy or radiculopathy, lumbosacral region: Secondary | ICD-10-CM

## 2023-09-12 DIAGNOSIS — M25552 Pain in left hip: Secondary | ICD-10-CM | POA: Insufficient documentation

## 2023-09-12 DIAGNOSIS — M48061 Spinal stenosis, lumbar region without neurogenic claudication: Secondary | ICD-10-CM

## 2023-09-12 DIAGNOSIS — M79605 Pain in left leg: Secondary | ICD-10-CM | POA: Diagnosis not present

## 2023-09-12 DIAGNOSIS — M79604 Pain in right leg: Secondary | ICD-10-CM

## 2023-09-12 DIAGNOSIS — R1032 Left lower quadrant pain: Secondary | ICD-10-CM | POA: Diagnosis not present

## 2023-09-12 DIAGNOSIS — Z7901 Long term (current) use of anticoagulants: Secondary | ICD-10-CM | POA: Insufficient documentation

## 2023-09-12 DIAGNOSIS — E559 Vitamin D deficiency, unspecified: Secondary | ICD-10-CM | POA: Insufficient documentation

## 2023-09-12 DIAGNOSIS — G894 Chronic pain syndrome: Secondary | ICD-10-CM | POA: Diagnosis not present

## 2023-09-12 DIAGNOSIS — M7918 Myalgia, other site: Secondary | ICD-10-CM | POA: Insufficient documentation

## 2023-09-12 DIAGNOSIS — M25522 Pain in left elbow: Secondary | ICD-10-CM | POA: Diagnosis not present

## 2023-09-12 DIAGNOSIS — G8929 Other chronic pain: Secondary | ICD-10-CM | POA: Insufficient documentation

## 2023-09-12 DIAGNOSIS — M1612 Unilateral primary osteoarthritis, left hip: Secondary | ICD-10-CM | POA: Insufficient documentation

## 2023-09-12 DIAGNOSIS — M2428 Disorder of ligament, vertebrae: Secondary | ICD-10-CM | POA: Diagnosis not present

## 2023-09-12 DIAGNOSIS — M160 Bilateral primary osteoarthritis of hip: Secondary | ICD-10-CM

## 2023-09-12 DIAGNOSIS — M545 Low back pain, unspecified: Secondary | ICD-10-CM | POA: Insufficient documentation

## 2023-09-12 DIAGNOSIS — M5126 Other intervertebral disc displacement, lumbar region: Secondary | ICD-10-CM | POA: Diagnosis not present

## 2023-09-12 DIAGNOSIS — R937 Abnormal findings on diagnostic imaging of other parts of musculoskeletal system: Secondary | ICD-10-CM | POA: Insufficient documentation

## 2023-09-12 DIAGNOSIS — M4807 Spinal stenosis, lumbosacral region: Secondary | ICD-10-CM

## 2023-09-12 MED ORDER — VITAMIN D3 125 MCG (5000 UT) PO CAPS
5000.0000 [IU] | ORAL_CAPSULE | Freq: Every day | ORAL | 0 refills | Status: AC
Start: 2023-09-12 — End: 2023-12-11

## 2023-09-12 MED ORDER — ERGOCALCIFEROL 1.25 MG (50000 UT) PO CAPS
50000.0000 [IU] | ORAL_CAPSULE | ORAL | 0 refills | Status: AC
Start: 2023-09-13 — End: 2023-10-25

## 2023-09-12 NOTE — Patient Instructions (Signed)
______________________________________________________________________    Blood Thinners  IMPORTANT NOTICE:  If you take any of these, make sure to notify the nursing staff.  Failure to do so may result in serious injury.  Recommended time intervals to stop and restart blood-thinners, before & after invasive procedures  Generic Name Brand Name Pre-procedure: Stop medication for this amount of time before your procedure: Post-procedure: Wait this amount of time after the procedure before restarting your medication:  Abciximab Reopro 15 days 2 hrs  Alteplase Activase 10 days 10 days  Anagrelide Agrylin    Apixaban Eliquis 3 days 6 hrs  Cilostazol Pletal 3 days 5 hrs  Clopidogrel Plavix 7-10 days 2 hrs  Dabigatran Pradaxa 5 days 6 hrs  Dalteparin Fragmin 24 hours 4 hrs  Dipyridamole Aggrenox 11days 2 hrs  Edoxaban Lixiana; Savaysa 3 days 2 hrs  Enoxaparin  Lovenox 24 hours 4 hrs  Eptifibatide Integrillin 8 hours 2 hrs  Fondaparinux  Arixtra 72 hours 12 hrs  Hydroxychloroquine Plaquenil 11 days   Prasugrel Effient 7-10 days 6 hrs  Reteplase Retavase 10 days 10 days  Rivaroxaban Xarelto 3 days 6 hrs  Ticagrelor Brilinta 5-7 days 6 hrs  Ticlopidine Ticlid 10-14 days 2 hrs  Tinzaparin Innohep 24 hours 4 hrs  Tirofiban Aggrastat 8 hours 2 hrs  Warfarin Coumadin 5 days 2 hrs   Other medications with blood-thinning effects  Product indications Generic (Brand) names Note  Cholesterol Lipitor Stop 4 days before procedure  Blood thinner (injectable) Heparin (LMW or LMWH Heparin) Stop 24 hours before procedure  Cancer Ibrutinib (Imbruvica) Stop 7 days before procedure  Malaria/Rheumatoid Hydroxychloroquine (Plaquenil) Stop 11 days before procedure  Thrombolytics  10 days before or after procedures   Over-the-counter (OTC) Products with blood-thinning effects  Product Common names Stop Time  Aspirin > 325 mg Goody Powders, Excedrin, etc. 11 days  Aspirin <= 81 mg  7 days  Fish oil   4 days  Garlic supplements  7 days  Ginkgo biloba  36 hours  Ginseng  24 hours  NSAIDs Ibuprofen, Naprosyn, etc. 3 days  Vitamin E  4 days   ______________________________________________________________________     ______________________________________________________________________    Procedure instructions  Stop blood-thinners  Do not eat or drink fluids (other than water) for 6 hours before your procedure  No water for 2 hours before your procedure  Take your blood pressure medicine with a sip of water  Arrive 30 minutes before your appointment  If sedation is planned, bring suitable driver. Pennie Banter, Benedetto Goad, & public transportation are NOT APPROVED)  Carefully read the "Preparing for your procedure" detailed instructions  If you have questions call us at [phone]  ______________________________________________________________________      ______________________________________________________________________    Preparing for your procedure  Appointments: If you think you may not be able to keep your appointment, call 24-48 hours in advance to cancel. We need time to make it available to others.  During your procedure appointment there will be: No Prescription Refills. No disability issues to discussed. No medication changes or discussions.  Instructions: Food intake: Avoid eating anything solid for at least 8 hours prior to your procedure. Clear liquid intake: You may take clear liquids such as water up to 2 hours prior to your procedure. (No carbonated drinks. No soda.) Transportation: Unless otherwise stated by your physician, bring a driver. (Driver cannot be a Market researcher, Pharmacist, community, or any other form of public transportation.) Morning Medicines: Except for blood thinners, take all of your other morning medications  with a sip of water. Make sure to take your heart and blood pressure medicines. If your blood pressure's lower number is above 100, the case will be  rescheduled. Blood thinners: Make sure to stop your blood thinners as instructed.  If you take a blood thinner, but were not instructed to stop it, call our office [phone] and ask to talk to a nurse. Not stopping a blood thinner prior to certain procedures could lead to serious complications. Diabetics on insulin: Notify the staff so that you can be scheduled 1st case in the morning. If your diabetes requires high dose insulin, take only  of your normal insulin dose the morning of the procedure and notify the staff that you have done so. Preventing infections: Shower with an antibacterial soap the morning of your procedure.  Build-up your immune system: Take 1000 mg of Vitamin C with every meal (3 times a day) the day prior to your procedure. Antibiotics: Inform the nursing staff if you are taking any antibiotics or if you have any conditions that may require antibiotics prior to procedures. (Example: recent joint implants)   Pregnancy: If you are pregnant make sure to notify the nursing staff. Not doing so may result in injury to the fetus, including death.  Sickness: If you have a cold, fever, or any active infections, call and cancel or reschedule your procedure. Receiving steroids while having an infection may result in complications. Arrival: You must be in the facility at least 30 minutes prior to your scheduled procedure. Tardiness: Your scheduled time is also the cutoff time. If you do not arrive at least 15 minutes prior to your procedure, you will be rescheduled.  Children: Do not bring any children with you. Make arrangements to keep them home. Dress appropriately: There is always a possibility that your clothing may get soiled. Avoid long dresses. Valuables: Do not bring any jewelry or valuables.  Reasons to call and reschedule or cancel your procedure: (Following these recommendations will minimize the risk of a serious complication.) Surgeries: Avoid having procedures within 2  weeks of any surgery. (Avoid for 2 weeks before or after any surgery). Flu Shots: Avoid having procedures within 2 weeks of a flu shots or . (Avoid for 2 weeks before or after immunizations). Barium: Avoid having a procedure within 7-10 days after having had a radiological study involving the use of radiological contrast. (Myelograms, Barium swallow or enema study). Heart attacks: Avoid any elective procedures or surgeries for the initial 6 months after a "Myocardial Infarction" (Heart Attack). Blood thinners: It is imperative that you stop these medications before procedures. Let us know if you if you take any blood thinner.  Infection: Avoid procedures during or within two weeks of an infection (including chest colds or gastrointestinal problems). Symptoms associated with infections include: Localized redness, fever, chills, night sweats or profuse sweating, burning sensation when voiding, cough, congestion, stuffiness, runny nose, sore throat, diarrhea, nausea, vomiting, cold or Flu symptoms, recent or current infections. It is specially important if the infection is over the area that we intend to treat. Heart and lung problems: Symptoms that may suggest an active cardiopulmonary problem include: cough, chest pain, breathing difficulties or shortness of breath, dizziness, ankle swelling, uncontrolled high or unusually low blood pressure, and/or palpitations. If you are experiencing any of these symptoms, cancel your procedure and contact your primary care physician for an evaluation.  Remember:  Regular Business hours are:  Monday to Thursday 8:00 AM to 4:00 PM  Provider's  Schedule: Delano Metz, MD:  Procedure days: Tuesday and Thursday 7:30 AM to 4:00 PM  Edward Jolly, MD:  Procedure days: Monday and Wednesday 7:30 AM to 4:00 PM Last  Updated: 07/24/2023 ______________________________________________________________________       ______________________________________________________________________    General Risks and Possible Complications  Patient Responsibilities: It is important that you read this as it is part of your informed consent. It is our duty to inform you of the risks and possible complications associated with treatments offered to you. It is your responsibility as a patient to read this and to ask questions about anything that is not clear or that you believe was not covered in this document.  Patient's Rights: You have the right to refuse treatment. You also have the right to change your mind, even after initially having agreed to have the treatment done. However, under this last option, if you wait until the last second to change your mind, you may be charged for the materials used up to that point.  Introduction: Medicine is not an Visual merchandiser. Everything in Medicine, including the lack of treatment(s), carries the potential for danger, harm, or loss (which is by definition: Risk). In Medicine, a complication is a secondary problem, condition, or disease that can aggravate an already existing one. All treatments carry the risk of possible complications. The fact that a side effects or complications occurs, does not imply that the treatment was conducted incorrectly. It must be clearly understood that these can happen even when everything is done following the highest safety standards.  No treatment: You can choose not to proceed with the proposed treatment alternative. The "PRO(s)" would include: avoiding the risk of complications associated with the therapy. The "CON(s)" would include: not getting any of the treatment benefits. These benefits fall under one of three categories: diagnostic; therapeutic; and/or palliative. Diagnostic benefits include: getting information which can ultimately lead to improvement of the disease or symptom(s). Therapeutic benefits are those associated with the successful  treatment of the disease. Finally, palliative benefits are those related to the decrease of the primary symptoms, without necessarily curing the condition (example: decreasing the pain from a flare-up of a chronic condition, such as incurable terminal cancer).  General Risks and Complications: These are associated to most interventional treatments. They can occur alone, or in combination. They fall under one of the following six (6) categories: no benefit or worsening of symptoms; bleeding; infection; nerve damage; allergic reactions; and/or death. No benefits or worsening of symptoms: In Medicine there are no guarantees, only probabilities. No healthcare provider can ever guarantee that a medical treatment will work, they can only state the probability that it may. Furthermore, there is always the possibility that the condition may worsen, either directly, or indirectly, as a consequence of the treatment. Bleeding: This is more common if the patient is taking a blood thinner, either prescription or over the counter (example: Goody Powders, Fish oil, Aspirin, Garlic, etc.), or if suffering a condition associated with impaired coagulation (example: Hemophilia, cirrhosis of the liver, low platelet counts, etc.). However, even if you do not have one on these, it can still happen. If you have any of these conditions, or take one of these drugs, make sure to notify your treating physician. Infection: This is more common in patients with a compromised immune system, either due to disease (example: diabetes, cancer, human immunodeficiency virus [HIV], etc.), or due to medications or treatments (example: therapies used to treat cancer and rheumatological diseases). However, even if  you do not have one on these, it can still happen. If you have any of these conditions, or take one of these drugs, make sure to notify your treating physician. Nerve Damage: This is more common when the treatment is an invasive one, but it  can also happen with the use of medications, such as those used in the treatment of cancer. The damage can occur to small secondary nerves, or to large primary ones, such as those in the spinal cord and brain. This damage may be temporary or permanent and it may lead to impairments that can range from temporary numbness to permanent paralysis and/or brain death. Allergic Reactions: Any time a substance or material comes in contact with our body, there is the possibility of an allergic reaction. These can range from a mild skin rash (contact dermatitis) to a severe systemic reaction (anaphylactic reaction), which can result in death. Death: In general, any medical intervention can result in death, most of the time due to an unforeseen complication. ______________________________________________________________________

## 2023-09-12 NOTE — Telephone Encounter (Signed)
Requested Prescriptions  Pending Prescriptions Disp Refills   allopurinol (ZYLOPRIM) 300 MG tablet [Pharmacy Med Name: ALLOPURINOL 300 MG TABLET] 90 tablet 2    Sig: TAKE 1 TABLET BY MOUTH EVERY DAY     Endocrinology:  Gout Agents - allopurinol Passed - 09/12/2023  1:43 AM      Passed - Uric Acid in normal range and within 360 days    Uric Acid  Date Value Ref Range Status  08/22/2023 7.4 3.8 - 8.4 mg/dL Final    Comment:               Therapeutic target for gout patients: <6.0         Passed - Cr in normal range and within 360 days    Creat  Date Value Ref Range Status  08/23/2017 1.16 0.70 - 1.25 mg/dL Final    Comment:    For patients >31 years of age, the reference limit for Creatinine is approximately 13% higher for people identified as African-American. .    Creatinine, Ser  Date Value Ref Range Status  08/22/2023 0.92 0.76 - 1.27 mg/dL Final         Passed - Valid encounter within last 12 months    Recent Outpatient Visits           2 months ago Obstructive sleep apnea   [place] [facility] [place], [name], MD   6 months ago Encounter for [employer] annual wellness exam   [facility] [place], [name], MD   4 years ago URI with cough and congestion   [facility] Chrism[place], Jodell Cipro, PA-C   4 years ago Sinusitis, unspecified chronicity, unspecified location   [facility] [place], Gamal[place], [place]   4 years ago Chest pain, unspecified type   [facility] [place], Molly Maduro, [place]       Future Appointments             In 5 days Bacigalupo, [name], MD [facility]            Passed - CBC within normal limits and completed in the last 12 months    WBC  Date Value Ref Range Status  03/16/2023 9.4 3.4 - 10.8 x10E3/uL Final  09/02/2019 13.2 (H) 4.0 - 10.5 K/uL Final   RBC  Date Value Ref Range Status   03/16/2023 4.74 4.14 - 5.80 x10E6/uL Final  09/02/2019 5.18 4.22 - 5.81 MIL/uL Final   Hemoglobin  Date Value Ref Range Status  03/16/2023 14.6 13.0 - 17.7 g/dL Final   Hematocrit  Date Value Ref Range Status  03/16/2023 43.9 37.5 - 51.0 % Final   MCHC  Date Value Ref Range Status  03/16/2023 33.3 31.5 - 35.7 g/dL Final  94/49/6759 16.3 30.0 - 36.0 g/dL Final   [facility]  Date Value Ref Range Status  03/16/2023 30.8 26.6 - 33.0 pg Final  09/02/2019 29.7 26.0 - 34.0 pg Final   MCV  Date Value Ref Range Status  03/16/2023 93 79 - 97 fL Final   No results found for: "PLTCOUNTKUC", "LABPLAT", "POCPLA" RDW  Date Value Ref Range Status  03/16/2023 12.2 11.6 - 15.4 % Final

## 2023-09-17 ENCOUNTER — Ambulatory Visit: Payer: Medicare Other | Admitting: Family Medicine

## 2023-09-17 ENCOUNTER — Encounter: Payer: Self-pay | Admitting: Family Medicine

## 2023-09-17 VITALS — BP 111/69 | HR 80 | Temp 97.6°F | Ht 71.0 in | Wt 348.6 lb

## 2023-09-17 DIAGNOSIS — G894 Chronic pain syndrome: Secondary | ICD-10-CM

## 2023-09-17 DIAGNOSIS — E66813 Obesity, class 3: Secondary | ICD-10-CM | POA: Diagnosis not present

## 2023-09-17 DIAGNOSIS — Z23 Encounter for immunization: Secondary | ICD-10-CM | POA: Diagnosis not present

## 2023-09-17 DIAGNOSIS — G4733 Obstructive sleep apnea (adult) (pediatric): Secondary | ICD-10-CM | POA: Diagnosis not present

## 2023-09-17 DIAGNOSIS — E559 Vitamin D deficiency, unspecified: Secondary | ICD-10-CM

## 2023-09-17 DIAGNOSIS — Z6841 Body Mass Index (BMI) 40.0 and over, adult: Secondary | ICD-10-CM

## 2023-09-17 NOTE — Progress Notes (Signed)
Established Patient Office Visit  Subjective   Patient ID: Charles Floyd, male    DOB: 10/15/1951  Age: 72 y.o. MRN: [id number]  Chief Complaint  Patient presents with   Follow-up    58month follow up    HPI  Discussed the use of AI scribe software for clinical note transcription with the patient, who gave verbal consent to proceed.  History of Present Illness   The patient, with a history of chronic pain, has been experiencing increased difficulty with mobility due to the pain. The pain has been so severe that it has affected the patient's ability to perform daily activities, such as walking to the car. The patient also reports fatigue, which could be attributed to the chronic pain and a recent diagnosis of vitamin D deficiency. The patient has a history of sleep apnea and has been using a CPAP machine, but usage has been inconsistent recently. The patient's wife reports that the patient has been talking and fighting in his sleep, which were symptoms present before the patient started using the CPAP machine. The patient's wife also mentions that the patient has only received one shingles shot in the past.         ROS    Objective:     BP 111/69 (BP Location: Left Arm, Patient Position: Sitting, Cuff Size: Large)   Pulse 80   Temp 97.6 F (36.4 C) (Oral)   Ht 5\' 11"  (1.803 m)   Wt (!) 348 lb 9.6 oz (158.1 kg)   SpO2 93%   BMI 48.62 kg/m    Physical Exam Vitals reviewed.  Constitutional:      General: He is not in acute distress.    Appearance: Normal appearance. He is not diaphoretic.  HENT:     Head: Normocephalic and atraumatic.  Eyes:     General: No scleral icterus.    Conjunctiva/sclera: Conjunctivae normal.  Cardiovascular:     Rate and Rhythm: Normal rate.  Pulmonary:     Effort: Pulmonary effort is normal. No respiratory distress.     Breath sounds: Normal breath sounds. No wheezing or rhonchi.  Musculoskeletal:     Cervical back: Neck supple.   Lymphadenopathy:     Cervical: No cervical adenopathy.  Skin:    General: Skin is warm and dry.  Neurological:     Mental Status: He is alert and oriented to person, place, and time. Mental status is at baseline.  Psychiatric:        Mood and Affect: Mood normal.        Behavior: Behavior normal.      No results found for any visits on 09/17/23.    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 -Continue with pain management plan as directed by specialist.  Vitamin D Deficiency Noted low levels of Vitamin D. Currently on high dose Vitamin D supplementation twice a week and daily Vitamin D3. -Continue Vitamin D supplementation as directed. -Check Vitamin D levels after completion  of high dose regimen.  Sleep Apnea Non-compliance with CPAP use noted. Reports of nightmares and talking in sleep, which were present prior to CPAP use. -Resume CPAP use nightly.  General Health Maintenance -Administer high dose influenza vaccine today. -Consider Shingrix vaccination for shingles prevention. Check coverage with Medicare Part D or other insurance. -Schedule wellness visit in six months.        Return in about 6 months (around 03/17/2024) for AWV.    Shirlee Latch, MD

## 2023-09-17 NOTE — Addendum Note (Signed)
Addended by: Janey Greaser D on: 09/17/2023 12:26 PM   Modules accepted: Orders

## 2023-09-27 ENCOUNTER — Encounter: Payer: Self-pay | Admitting: Pain Medicine

## 2023-09-27 ENCOUNTER — Ambulatory Visit
Admission: RE | Admit: 2023-09-27 | Discharge: 2023-09-27 | Disposition: A | Discharge status: Home / Self Care | Payer: Medicare Other | Source: Ambulatory Visit | Attending: Pain Medicine | Admitting: Pain Medicine

## 2023-09-27 ENCOUNTER — Ambulatory Visit: Payer: Medicare Other | Attending: Pain Medicine | Admitting: Pain Medicine

## 2023-09-27 VITALS — BP 122/77 | HR 86 | Temp 98.4°F | Resp 16 | Ht 71.5 in | Wt 345.0 lb

## 2023-09-27 DIAGNOSIS — Z6841 Body Mass Index (BMI) 40.0 and over, adult: Secondary | ICD-10-CM | POA: Diagnosis not present

## 2023-09-27 DIAGNOSIS — E66813 Obesity, class 3: Secondary | ICD-10-CM | POA: Diagnosis not present

## 2023-09-27 DIAGNOSIS — M1612 Unilateral primary osteoarthritis, left hip: Secondary | ICD-10-CM | POA: Insufficient documentation

## 2023-09-27 DIAGNOSIS — G8929 Other chronic pain: Secondary | ICD-10-CM | POA: Diagnosis not present

## 2023-09-27 DIAGNOSIS — Z7901 Long term (current) use of anticoagulants: Secondary | ICD-10-CM | POA: Insufficient documentation

## 2023-09-27 DIAGNOSIS — M7918 Myalgia, other site: Secondary | ICD-10-CM | POA: Diagnosis not present

## 2023-09-27 DIAGNOSIS — R1032 Left lower quadrant pain: Secondary | ICD-10-CM | POA: Diagnosis not present

## 2023-09-27 DIAGNOSIS — M25552 Pain in left hip: Secondary | ICD-10-CM | POA: Insufficient documentation

## 2023-09-27 MED ORDER — IOHEXOL 180 MG/ML  SOLN
INTRAMUSCULAR | Status: AC
  Filled 2023-09-27: qty 10

## 2023-09-27 MED ORDER — IOHEXOL 180 MG/ML  SOLN
10.0000 mL | Freq: Once | INTRAMUSCULAR | Status: AC
  Administered 2023-09-27: 10 mL via INTRA_ARTICULAR

## 2023-09-27 MED ORDER — LACTATED RINGERS IV SOLN: Freq: Once | INTRAVENOUS | Status: AC

## 2023-09-27 MED ORDER — FENTANYL CITRATE (PF) 100 MCG/2ML IJ SOLN
INTRAMUSCULAR | Status: AC
  Filled 2023-09-27: qty 2

## 2023-09-27 MED ORDER — MIDAZOLAM HCL 5 MG/5ML IJ SOLN
INTRAMUSCULAR | Status: AC
Start: 2023-09-27 — End: ?
  Filled 2023-09-27: qty 5

## 2023-09-27 MED ORDER — ROPIVACAINE HCL 2 MG/ML IJ SOLN
INTRAMUSCULAR | Status: AC
  Filled 2023-09-27: qty 20

## 2023-09-27 MED ORDER — PENTAFLUOROPROP-TETRAFLUOROETH EX AERO
INHALATION_SPRAY | Freq: Once | CUTANEOUS | Status: DC
  Filled 2023-09-27: qty 30

## 2023-09-27 MED ORDER — ROPIVACAINE HCL 2 MG/ML IJ SOLN
9.0000 mL | Freq: Once | INTRAMUSCULAR | Status: AC
  Administered 2023-09-27: 9 mL via INTRA_ARTICULAR

## 2023-09-27 MED ORDER — METHYLPREDNISOLONE ACETATE 80 MG/ML IJ SUSP
80.0000 mg | Freq: Once | INTRAMUSCULAR | Status: AC
  Administered 2023-09-27: 80 mg via INTRA_ARTICULAR

## 2023-09-27 MED ORDER — LIDOCAINE HCL 2 % IJ SOLN
20.0000 mL | Freq: Once | INTRAMUSCULAR | Status: AC
  Administered 2023-09-27: 400 mg

## 2023-09-27 MED ORDER — LIDOCAINE HCL 2 % IJ SOLN
INTRAMUSCULAR | Status: AC
  Filled 2023-09-27: qty 20

## 2023-09-27 MED ORDER — MIDAZOLAM HCL 5 MG/5ML IJ SOLN
0.5000 mg | Freq: Once | INTRAMUSCULAR | Status: AC
  Administered 2023-09-27: 2 mg via INTRAVENOUS

## 2023-09-27 MED ORDER — METHYLPREDNISOLONE ACETATE 80 MG/ML IJ SUSP
INTRAMUSCULAR | Status: AC
  Filled 2023-09-27: qty 1

## 2023-09-27 MED ORDER — FENTANYL CITRATE (PF) 100 MCG/2ML IJ SOLN: 25.0000 ug | INTRAMUSCULAR | Status: DC | PRN

## 2023-09-27 NOTE — Progress Notes (Signed)
Safety precautions to be maintained throughout the outpatient stay will include: orient to surroundings, keep bed in low position, maintain call bell within reach at all times, provide assistance with transfer out of bed and ambulation.  

## 2023-09-27 NOTE — Progress Notes (Signed)
PROVIDER NOTE: Interpretation of information contained herein should be left to medically-trained personnel. Specific patient instructions are provided elsewhere under "Patient Instructions" section of medical record. This document was created in part using STT-dictation technology, any transcriptional errors that may result from this process are unintentional.  Patient: Charles Floyd Type: Established DOB: 01-31-1951 MRN: [id number] PCP: Erasmo Downer, MD  Service: Procedure DOS: 09/27/2023 Setting: Ambulatory Location: Ambulatory outpatient facility Delivery: Face-to-face Provider: Oswaldo Done, MD Specialty: Interventional Pain Management Specialty designation: 09 Location: Outpatient facility Ref. Prov.: Beryle Flock [name], MD       Interventional Therapy   Procedure: Intra-articular hip injection  #1  Laterality: Left (-LT)  Approach: Percutaneous posterolateral approach. Level: Lower pelvic and hip joint level.  Imaging: Fluoroscopy-guided         Anesthesia: Local anesthesia (1-2% Lidocaine) Anxiolysis: IV Versed 2.0 mg Sedation: No Sedation                       DOS: 09/27/2023  Performed by: Oswaldo Done, MD  Purpose: Diagnostic/Therapeutic Indications: Hip pain severe enough to impact quality of life or function. Rationale (medical necessity): procedure needed and proper for the diagnosis and/or treatment of Charles Floyd medical symptoms and needs. 1. Chronic hip pain (Left)   2. Chronic groin pain (Left)   3. Chronic gluteal pain (1ry area of Pain) (Left)   4. Osteoarthritis of hip (Severe) (Left)   5. Chronic anticoagulation (Eliquis)   6. Class 3 severe obesity with serious comorbidity and body mass index (BMI) of 45.0 to 49.9 in adult, unspecified obesity type (HCC)   7. Chronic left hip pain   8. Chronic groin pain, left   9. Chronic gluteal pain   10. Primary osteoarthritis of left hip    NAS-11 Pain score:   Pre-procedure: 9 /10    Post-procedure: 0-No pain/10      Target: Intra-articular hip joint Region: Hip joint, upper (proximal) femoral region Type of procedure: Percutaneous joint injection   Position / Prep / Materials:  Position: Lateral Decubitus with affected side up  Prep solution: ChloraPrep (2% chlorhexidine gluconate and 70% isopropyl alcohol) Prep Area:  Entire Posterolateral hip area. Materials:  Tray: Block tray Needle(s):  Type: Spinal  Gauge (G): 22  Length: 7-in  Qty: 1  H&P (Pre-op Assessment):  Charles Floyd is a 72 y.o. (year old), male patient, seen today for interventional treatment. He  has a past surgical history that includes Colonoscopy (Jan 2003, 12-17-13); kidney stone extraction (07/2013); Prostatectomy (05/16/2011); Prostate surgery (2012); Cholecystectomy (N/A, 09/18/2017); Colonoscopy with propofol (N/A, 01/26/2021); Cardioversion (N/A, 07/13/2021); and Cataract extraction (Bilateral, 04/2021). Charles Floyd has a current medication list which includes the following prescription(s): acetaminophen, allopurinol, apixaban, vitamin d3, ergocalciferol, gabapentin, and metoprolol succinate, and the following Facility-Administered Medications: fentanyl, lactated ringers, and pentafluoroprop-tetrafluoroeth. His primarily concern today is the Hip Pain (left)  Initial Vital Signs:  Pulse/HCG Rate: 74ECG Heart Rate: 81 (NSR) Temp: 98.4 F (36.9 C) Resp: 17 BP: 103/66 SpO2: 94 %  BMI: Estimated body mass index is 47.45 kg/m as calculated from the following:   Height as of this encounter: 5' 11.5" (1.816 m).   Weight as of this encounter: 345 lb (156.5 kg).  Risk Assessment: Allergies: Reviewed. He has No Known Allergies.  Allergy Precautions: None required Coagulopathies: Reviewed. None identified.  Blood-thinner therapy: None at this time Active Infection(s): Reviewed. None identified. Charles Floyd is afebrile  Site Confirmation: Charles Floyd was asked to  confirm the procedure and  laterality before marking the site Procedure checklist: Completed Consent: Before the procedure and under the influence of no sedative(s), amnesic(s), or anxiolytics, the patient was informed of the treatment options, risks and possible complications. To fulfill our ethical and legal obligations, as recommended by the American Medical Association's Code of Ethics, I have informed the patient of my clinical impression; the nature and purpose of the treatment or procedure; the risks, benefits, and possible complications of the intervention; the alternatives, including doing nothing; the risk(s) and benefit(s) of the alternative treatment(s) or procedure(s); and the risk(s) and benefit(s) of doing nothing. The patient was provided information about the general risks and possible complications associated with the procedure. These may include, but are not limited to: failure to achieve desired goals, infection, bleeding, organ or nerve damage, allergic reactions, paralysis, and death. In addition, the patient was informed of those risks and complications associated to the procedure, such as failure to decrease pain; infection; bleeding; organ or nerve damage with subsequent damage to sensory, motor, and/or autonomic systems, resulting in permanent pain, numbness, and/or weakness of one or several areas of the body; allergic reactions; (i.e.: anaphylactic reaction); and/or death. Furthermore, the patient was informed of those risks and complications associated with the medications. These include, but are not limited to: allergic reactions (i.e.: anaphylactic or anaphylactoid reaction(s)); adrenal axis suppression; blood sugar elevation that in diabetics may result in ketoacidosis or comma; water retention that in patients with history of congestive heart failure may result in shortness of breath, pulmonary edema, and decompensation with resultant heart failure; weight gain; swelling or edema; medication-induced  neural toxicity; particulate matter embolism and blood vessel occlusion with resultant organ, and/or nervous system infarction; and/or aseptic necrosis of one or more joints. Finally, the patient was informed that Medicine is not an exact science; therefore, there is also the possibility of unforeseen or unpredictable risks and/or possible complications that may result in a catastrophic outcome. The patient indicated having understood very clearly. We have given the patient no guarantees and we have made no promises. Enough time was given to the patient to ask questions, all of which were answered to the patient's satisfaction. Mr. Diab has indicated that he wanted to continue with the procedure. Attestation: I, the ordering provider, attest that I have discussed with the patient the benefits, risks, side-effects, alternatives, likelihood of achieving goals, and potential problems during recovery for the procedure that I have provided informed consent. Date  Time: 09/27/2023  9:32 AM  Pre-Procedure Preparation:  Monitoring: As per clinic protocol. Respiration, ETCO2, SpO2, BP, heart rate and rhythm monitor placed and checked for adequate function Safety Precautions: Patient was assessed for positional comfort and pressure points before starting the procedure. Time-out: I initiated and conducted the "Time-out" before starting the procedure, as per protocol. The patient was asked to participate by confirming the accuracy of the "Time Out" information. Verification of the correct person, site, and procedure were performed and confirmed by me, the nursing staff, and the patient. "Time-out" conducted as per Joint Commission's Universal Protocol (UP.01.01.01). Time: 1055 Start Time: 1055 hrs.  Description/Narrative of Procedure:          Rationale (medical necessity): procedure needed and proper for the diagnosis and/or treatment of the patient's medical symptoms and needs. Procedural Technique Safety  Precautions: Aspiration looking for blood return was conducted prior to all injections. At no point did we inject any substances, as a needle was being advanced. No attempts were made at  seeking any paresthesias. Safe injection practices and needle disposal techniques used. Medications properly checked for expiration dates. SDV (single dose vial) medications used. Description of the Procedure: Protocol guidelines were followed. The patient was assisted into a comfortable position. The target area was identified and the area prepped in the usual manner. Skin & deeper tissues infiltrated with local anesthetic. Appropriate amount of time allowed to pass for local anesthetics to take effect. The procedure needles were then advanced to the target area. Proper needle placement secured. Negative aspiration confirmed. Solution injected in intermittent fashion, asking for systemic symptoms every 0.5cc of injectate. The needles were then removed and the area cleansed, making sure to leave some of the prepping solution back to take advantage of its long term bactericidal properties.  Technical description of procedure:  Skin & deeper tissues infiltrated with local anesthetic. Appropriate amount of time allowed to pass for local anesthetics to take effect. The procedure needles were then advanced to the target area. Proper needle placement secured. Negative aspiration confirmed. Solution injected in intermittent fashion, asking for systemic symptoms every 0.5cc of injectate. The needles were then removed and the area cleansed, making sure to leave some of the prepping solution back to take advantage of its long term bactericidal properties.       Vitals:   09/27/23 1055 09/27/23 1100 09/27/23 1104 09/27/23 1115  BP: (!) 138/116 109/69 126/88 122/77  Pulse:    86  Resp: 18 17 18 16   Temp:      TempSrc:      SpO2: 99% 97% 99% 96%  Weight:      Height:         Start Time: 1055 hrs. End Time: 1104  hrs.  Imaging Guidance (Non-Spinal):          Type of Imaging Technique: Fluoroscopy Guidance (Non-Spinal) Indication(s): Assistance in needle guidance and placement for procedures requiring needle placement in or near specific anatomical locations not easily accessible without such assistance. Exposure Time: Please see nurses notes. Contrast: Before injecting any contrast, we confirmed that the patient did not have an allergy to iodine, shellfish, or radiological contrast. Once satisfactory needle placement was completed at the desired level, radiological contrast was injected. Contrast injected under live fluoroscopy. No contrast complications. See chart for type and volume of contrast used. Fluoroscopic Guidance: I was personally present during the use of fluoroscopy. "Tunnel Vision Technique" used to obtain the best possible view of the target area. Parallax error corrected before commencing the procedure. "Direction-depth-direction" technique used to introduce the needle under continuous pulsed fluoroscopy. Once target was reached, antero-posterior, oblique, and lateral fluoroscopic projection used confirm needle placement in all planes. Images permanently stored in EMR. Interpretation: I personally interpreted the imaging intraoperatively. Adequate needle placement confirmed in multiple planes. Appropriate spread of contrast into desired area was observed. No evidence of afferent or efferent intravascular uptake. Permanent images saved into the patient's record.  Post-operative Assessment:  Post-procedure Vital Signs:  Pulse/HCG Rate: 8674 (NSR) Temp: 98.4 F (36.9 C) Resp: 16 BP: 122/77 SpO2: 96 %  EBL: None  Complications: No immediate post-treatment complications observed by team, or reported by patient.  Note: The patient tolerated the entire procedure well. A repeat set of vitals were taken after the procedure and the patient was kept under observation following institutional  policy, for this type of procedure. Post-procedural neurological assessment was performed, showing return to baseline, prior to discharge. The patient was provided with post-procedure discharge instructions, including a section on how  to identify potential problems. Should any problems arise concerning this procedure, the patient was given instructions to immediately contact us, at any time, without hesitation. In any case, we plan to contact the patient by telephone for a follow-up status report regarding this interventional procedure.  Comments:  No additional relevant information.  Plan of Care (POC)  Orders:  Orders Placed This Encounter  Procedures   HIP INJECTION    Scheduling Instructions:     Side: Left-sided     Sedation: Patient's choice.     Timeframe: Today   DG PAIN [facility] C-ARM 1-60 MIN NO REPORT    Intraoperative interpretation by procedural physician at [facility].    Standing Status:   Standing    Number of Occurrences:   1    Order Specific Question:   Reason for exam:    Answer:   Assistance in needle guidance and placement for procedures requiring needle placement in or near specific anatomical locations not easily accessible without such assistance.   Informed Consent Details: Physician/Practitioner Attestation; Transcribe to consent form and obtain patient signature    Nursing Order: Transcribe to consent form and obtain patient signature. Note: Always confirm laterality of pain with Mr. Wydra, before procedure.    Order Specific Question:   Physician/Practitioner attestation of informed consent for procedure/surgical case    Answer:   I, the physician/practitioner, attest that I have discussed with the patient the benefits, risks, side effects, alternatives, likelihood of achieving goals and potential problems during recovery for the procedure that I have provided informed consent.    Order Specific Question:   Procedure    Answer:   Hip injection     Order Specific Question:   Physician/Practitioner performing the procedure    Answer:   Talmage Teaster A. Laban Emperor, MD    Order Specific Question:   Indication/Reason    Answer:   Hip Joint Pain (Arthralgia)   Care order/instruction: Please confirm that the patient has stopped the Eliquis (Apixaban) x 3 days prior to procedure or surgery.    Please confirm that the patient has stopped the Eliquis (Apixaban) x 3 days prior to procedure or surgery.    Standing Status:   Standing    Number of Occurrences:   1   Provide equipment / supplies at bedside    Procedure tray: "Block Tray" (Disposable  single use) Skin infiltration needle: Regular 1.5-in, 25-G, (x1) Block Needle type: Spinal Amount/quantity: 1 Size: Long (7-inch) Gauge: 22G    Standing Status:   Standing    Number of Occurrences:   1    Order Specific Question:   Specify    Answer:   Block Tray   Bleeding precautions    Standing Status:   Standing    Number of Occurrences:   1   Chronic Opioid Analgesic:  None MME/day: 0 mg/day   Medications ordered for procedure: Meds ordered this encounter  Medications   iohexol (OMNIPAQUE) 180 MG/ML injection 10 mL    Must be Myelogram-compatible. If not available, you may substitute with a water-soluble, non-ionic, hypoallergenic, myelogram-compatible radiological contrast medium.   lidocaine (XYLOCAINE) 2 % (with pres) injection 400 mg   pentafluoroprop-tetrafluoroeth (GEBAUERS) aerosol   lactated ringers infusion   midazolam (VERSED) 5 MG/5ML injection 0.5-2 mg    Make sure Flumazenil is available in the pyxis when using this medication. If oversedation occurs, administer 0.2 mg IV over 15 sec. If after 45 sec no response, administer 0.2 mg again over 1 min;  may repeat at 1 min intervals; not to exceed 4 doses (1 mg)   fentaNYL (SUBLIMAZE) injection 25-50 mcg    Make sure Narcan is available in the pyxis when using this medication. In the event of respiratory depression (RR< 8/min):  Titrate NARCAN (naloxone) in increments of 0.1 to 0.2 mg IV at 2-3 minute intervals, until desired degree of reversal.   ropivacaine (PF) 2 mg/mL (0.2%) (NAROPIN) injection 9 mL   methylPREDNISolone acetate (DEPO-MEDROL) injection 80 mg   Medications administered: We administered iohexol, lidocaine, lactated ringers, midazolam, ropivacaine (PF) 2 mg/mL (0.2%), and methylPREDNISolone acetate.  See the medical record for exact dosing, route, and time of administration.  Follow-up plan:   Return in about 2 weeks (around 10/11/2023) for (Face2F), (PPE).       Interventional Therapies  Risk Factors  Considerations  Medical Comorbidities:  Eliquis Anticoagulation: (Stop: 3 days  Restart: 6 hours ) 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 Cardioversion)  Anxiety  panic attacks  Hx. Nephrolithiasis  Hx prostate cancer     Planned  Pending:   Diagnostic left anterolateral IA hip joint injection #1 (09/27/2023)    Under consideration:   Request for clearance to stop Eliquis (09/12/2023)  Diagnostic left anterolateral IA hip joint injection #1  Diagnostic bilateral lumbar facet MBB #1    Completed:   None at this time   Therapeutic  Palliative (PRN) options:   None established   Completed by other providers:   Therapeutic left L3-4 TFESI x1 (04/26/2020) by Filomena Jungling, MD [facility] PMR)  Therapeutic left L4-5 TFESI x2 (02/16/2020, 04/26/2020) by Filomena Jungling, MD [facility] PMR)  Therapeutic left L5-S1 TFESI x2 (02/16/2020, 08/18/2021) by Filomena Jungling, MD [facility] PMR)  Therapeutic left IA hip injection x1 (04/05/2020) by Filomena Jungling, MD [facility] PMR)         Recent Visits Date Type Provider Dept  09/12/23 Office Visit Delano Metz, MD [facility]  08/22/23 Office Visit Delano Metz, MD [facility]  Showing recent visits within past 90 days and meeting all other requirements Today's Visits Date Type Provider Dept  09/27/23 Procedure visit Delano Metz, MD [facility]  Showing today's visits and meeting all other requirements Future Appointments Date Type Provider Dept  10/18/23 Appointment Delano Metz, MD [facility]  Showing future appointments within next 90 days and meeting all other requirements  Disposition: Discharge home  Discharge (Date  Time): 09/27/2023; 1121 hrs.   Primary Care Physician: Erasmo Downer, MD Location: [facility] Note by: Oswaldo Done, MD (TTS technology used. I apologize for any typographical errors that were not detected and corrected.) Date: 09/27/2023; Time: 11:44 AM  Disclaimer:  Medicine is not an Visual merchandiser. The only guarantee in medicine is that nothing is guaranteed. It is important to note that the decision to proceed with this intervention was based on the information collected from the patient. The Data and conclusions were drawn from the patient's questionnaire, the interview, and the physical examination. Because the information was provided in large part by the patient, it cannot be guaranteed that it has not been purposely or unconsciously manipulated. Every effort has been made to obtain as much relevant data as possible for this evaluation. It is important to note that the conclusions that lead to this procedure are derived in large part from the available data. Always take into account that the treatment will also be dependent on availability of resources  and existing treatment guidelines, considered by other Pain Management Practitioners as being common knowledge and practice, at the time of the intervention. For Medico-Legal purposes, it is also important to point out that variation in procedural techniques and pharmacological choices are the acceptable norm. The indications, contraindications, technique, and results of the above procedure should only be interpreted and judged by a Board-Certified Interventional Pain  Specialist with extensive familiarity and expertise in the same exact procedure and technique.

## 2023-09-27 NOTE — Patient Instructions (Addendum)
______________________________________________________________________    Blood Thinners  IMPORTANT NOTICE:  If you take any of these, make sure to notify the nursing staff.  Failure to do so may result in serious injury.  Recommended time intervals to stop and restart blood-thinners, before & after invasive procedures  Generic Name Brand Name Pre-procedure: Stop medication for this amount of time before your procedure: Post-procedure: Wait this amount of time after the procedure before restarting your medication:  Abciximab Reopro 15 days 2 hrs  Alteplase Activase 10 days 10 days  Anagrelide Agrylin    Apixaban Eliquis 3 days 6 hrs  Cilostazol Pletal 3 days 5 hrs  Clopidogrel Plavix 7-10 days 2 hrs  Dabigatran Pradaxa 5 days 6 hrs  Dalteparin Fragmin 24 hours 4 hrs  Dipyridamole Aggrenox 11days 2 hrs  Edoxaban Lixiana; Savaysa 3 days 2 hrs  Enoxaparin  Lovenox 24 hours 4 hrs  Eptifibatide Integrillin 8 hours 2 hrs  Fondaparinux  Arixtra 72 hours 12 hrs  Hydroxychloroquine Plaquenil 11 days   Prasugrel Effient 7-10 days 6 hrs  Reteplase Retavase 10 days 10 days  Rivaroxaban Xarelto 3 days 6 hrs  Ticagrelor Brilinta 5-7 days 6 hrs  Ticlopidine Ticlid 10-14 days 2 hrs  Tinzaparin Innohep 24 hours 4 hrs  Tirofiban Aggrastat 8 hours 2 hrs  Warfarin Coumadin 5 days 2 hrs   Other medications with blood-thinning effects  Product indications Generic (Brand) names Note  Cholesterol Lipitor Stop 4 days before procedure  Blood thinner (injectable) Heparin (LMW or LMWH Heparin) Stop 24 hours before procedure  Cancer Ibrutinib (Imbruvica) Stop 7 days before procedure  Malaria/Rheumatoid Hydroxychloroquine (Plaquenil) Stop 11 days before procedure  Thrombolytics  10 days before or after procedures   Over-the-counter (OTC) Products with blood-thinning effects  Product Common names Stop Time  Aspirin > 325 mg Goody Powders, Excedrin, etc. 11 days  Aspirin <= 81 mg  7 days  Fish oil   4 days  Garlic supplements  7 days  Ginkgo biloba  36 hours  Ginseng  24 hours  NSAIDs Ibuprofen, Naprosyn, etc. 3 days  Vitamin E  4 days   ______________________________________________________________________      ______________________________________________________________________    Post-Procedure Discharge Instructions  Instructions: Apply ice:  Purpose: This will minimize any swelling and discomfort after procedure.  When: Day of procedure, as soon as you get home. How: Fill a plastic sandwich bag with crushed ice. Cover it with a small towel and apply to injection site. How long: (15 min on, 15 min off) Apply for 15 minutes then remove x 15 minutes.  Repeat sequence on day of procedure, until you go to bed. Apply heat:  Purpose: To treat any soreness and discomfort from the procedure. When: Starting the next day after the procedure. How: Apply heat to procedure site starting the day following the procedure. How long: May continue to repeat daily, until discomfort goes away. Food intake: Start with clear liquids (like water) and advance to regular food, as tolerated.  Physical activities: Keep activities to a minimum for the first 8 hours after the procedure. After that, then as tolerated. Driving: If you have received any sedation, be responsible and do not drive. You are not allowed to drive for 24 hours after having sedation. Blood thinner: (Applies only to those taking blood thinners) You may restart your blood thinner 6 hours after your procedure. Insulin: (Applies only to Diabetic patients taking insulin) As soon as you can eat, you may resume your normal dosing schedule. Infection prevention:  Keep procedure site clean and dry. Shower daily and clean area with soap and water. Post-procedure Pain Diary: Extremely important that this be done correctly and accurately. Recorded information will be used to determine the next step in treatment. For the purpose of accuracy,  follow these rules: Evaluate only the area treated. Do not report or include pain from an untreated area. For the purpose of this evaluation, ignore all other areas of pain, except for the treated area. After your procedure, avoid taking a long nap and attempting to complete the pain diary after you wake up. Instead, set your alarm clock to go off every hour, on the hour, for the initial 8 hours after the procedure. Document the duration of the numbing medicine, and the relief you are getting from it. Do not go to sleep and attempt to complete it later. It will not be accurate. If you received sedation, it is likely that you were given a medication that may cause amnesia. Because of this, completing the diary at a later time may cause the information to be inaccurate. This information is needed to plan your care. Follow-up appointment: Keep your post-procedure follow-up evaluation appointment after the procedure (usually 2 weeks for most procedures, 6 weeks for radiofrequencies). DO NOT FORGET to bring you pain diary with you.   Expect: (What should I expect to see with my procedure?) From numbing medicine (AKA: Local Anesthetics): Numbness or decrease in pain. You may also experience some weakness, which if present, could last for the duration of the local anesthetic. Onset: Full effect within 15 minutes of injected. Duration: It will depend on the type of local anesthetic used. On the average, 1 to 8 hours.  From steroids (Applies only if steroids were used): Decrease in swelling or inflammation. Once inflammation is improved, relief of the pain will follow. Onset of benefits: Depends on the amount of swelling present. The more swelling, the longer it will take for the benefits to be seen. In some cases, up to 10 days. Duration: Steroids will stay in the system x 2 weeks. Duration of benefits will depend on multiple posibilities including persistent irritating factors. Side-effects: If present, they  may typically last 2 weeks (the duration of the steroids). Frequent: Cramps (if they occur, drink Gatorade and take over-the-counter Magnesium 450-500 mg once to twice a day); water retention with temporary weight gain; increases in blood sugar; decreased immune system response; increased appetite. Occasional: Facial flushing (red, warm cheeks); mood swings; menstrual changes. Uncommon: Long-term decrease or suppression of natural hormones; bone thinning. (These are more common with higher doses or more frequent use. This is why we prefer that our patients avoid having any injection therapies in other practices.)  Very Rare: Severe mood changes; psychosis; aseptic necrosis. From procedure: Some discomfort is to be expected once the numbing medicine wears off. This should be minimal if ice and heat are applied as instructed.  Call if: (When should I call?) You experience numbness and weakness that gets worse with time, as opposed to wearing off. New onset bowel or bladder incontinence. (Applies only to procedures done in the spine)  Emergency Numbers: Durning business hours (Monday - Thursday, 8:00 AM - 4:00 PM) (Friday, 9:00 AM - 12:00 Noon): [phone] After hours: [phone] NOTE: If you are having a problem and are unable connect with, or to talk to a provider, then go to your nearest urgent care or emergency department. If the problem is serious and urgent, please call 911. ______________________________________________________________________  ______________________________________________________________________    Patient information on: Body mass index (BMI) and Weight Management  Dear Charles Floyd you are receiving this information because your weight may be adversely affecting your health.   Your current Estimated body mass index is 48.62 kg/m as calculated from the following:   Height as of 09/17/23: 5\' 11"  (1.803 m).   Weight as of 09/17/23: 348 lb 9.6 oz (158.1  kg).  We recommend you talk to your primary care physician about providing or referring you to a supervised weight management program.  Here is some information about weight and the body mass index (BMI) classification:  BMI is a measure of obesity that's calculated by dividing a person's weight in kilograms by their height in meters squared. A person can use an online calculator to determine their BMI. Body mass index (BMI) is a common tool for deciding whether a person has an appropriate body weight.  It measures a person's weight in relation to their height.  According to the [facility] of health (NIH): A BMI of less than 18.5 means that a person is underweight. A BMI of between 18.5 and 24.9 is ideal. A BMI of between 25 and 29.9 is overweight. A BMI over 30 indicates obesity.  Body Mass Index (BMI) Classification BMI level (kg/m2) Category Associated incidence of chronic pain  <18  Underweight   18.5-24.9 Ideal body weight   25-29.9 Overweight  20%  30-34.9 Obese (Class I)  68%  35-39.9 Severe obesity (Class II)  136%  >40 Extreme obesity (Class III)  254%    Morbidly Obese Classification: You will be considered to be "Morbidly Obese" if your BMI is above 30 and you have one or more of the following conditions caused or associated to obesity: 1.    Type 2 Diabetes (Leading to cardiovascular diseases (CVD), stroke, peripheral vascular diseases (PVD), retinopathy, nephropathy, and neuropathy) 2.    Cardiovascular Disease (High Blood Pressure; Congestive Heart Failure; High Cholesterol; Coronary Artery Disease; Angina; Arrhythmias, Dysrhythmias, or Heart Attacks) 3.    Breathing problems (Asthma; obesity-hypoventilation syndrome; obstructive sleep apnea; chronic inflammatory airway disease; reactive airway disease; or shortness of breath) 4.    Chronic kidney disease 5.    Liver disease (nonalcoholic fatty liver disease) 6.    High blood pressure 7.    Acid reflux  (gastroesophageal reflux disease; heartburn) 8.    Osteoarthritis (OA) (affecting the hip(s), the knee(s) and/or the lower back) (usually requiring knee and/or hip replacements, as well as back surgeries) 9.    Low back pain (Lumbar Facet Syndrome; and/or Degenerative Disc Disease) 10.  Hip pain (Osteoarthritis of hip) (For every 1 lbs of added body weight, there is a 2 lbs increase in pressure inside of each hip articulation. 1:2 mechanical relationship) 11.  Knee pain (Osteoarthritis of knee) (For every 1 lbs of added body weight, there is a 4 lbs increase in pressure inside of each knee articulation. 1:4 mechanical relationship) (patients with a BMI>30 kg/m2 were 6.8 times more likely to develop knee OA than normal-weight individuals) 12.  Cancer: Epidemiological studies have shown that obesity is a risk factor for: post-menopausal breast cancer; cancers of the endometrium, colon and kidney cancer; malignant adenomas of the esophagus. Obese subjects have an approximately 1.5-3.5-fold increased risk of developing these cancers compared with normal-weight subjects, and it has been estimated that between 15 and 45% of these cancers can be attributed to overweight. More recent studies suggest that obesity may also increase the risk of other  types of cancer, including pancreatic, hepatic and gallbladder cancer. (Ref: Obesity and cancer. Pischon T, Nthlings U, [employer] H. Proc Nutr Soc. 2008 May;67(2):128-45. doi: 10.1017/S0029665108006976.) The International Agency for Research on Cancer (IARC) has identified 13 cancers associated with overweight and obesity: meningioma, multiple myeloma, adenocarcinoma of the esophagus, and cancers of the thyroid, postmenopausal breast cancer, gallbladder, stomach, liver, pancreas, kidney, ovaries, uterus, colon and rectal (colorectal) cancers. 55 percent of all cancers diagnosed in women and 24 percent of those diagnosed in men are associated with overweight and  obesity.  Recommendation: If you have any of the above conditions it is urgent that you take a step back and concentrate in losing weight. Dedicate 100% of your efforts on this task. Nothing else will improve your health more than bringing your weight down and your BMI to less than 30.   Nutritionist and/or supervised weight-management program: We are aware that most chronic pain patients are unable to exercise secondary to their pain. For this reason, you must rely on proper nutrition and diet in order to lose the weight. We recommend you talk to a nutritionist.   Bariatric surgery: A person might be considered a candidate for bariatric surgery if they meet one of the following BMI criteria:  BMI of 40 or higher: This is considered extreme obesity (Class III). BMI of 35-39.9: This is considered obesity, and the person might also have a serious weight-related health condition, such as high blood pressure, type 2 diabetes, or severe sleep apnea  BMI of 30-34.9: This might be considered if the person has serious weight-related health problems and hasn't had substantial weight loss or improvement in co-morbidities through other methods   On your own: A realistic goal is to lose 10% of your body weight over a period of 12 months.  If over a period of six (6) months you have unsuccessfully tried to lose weight, then it is time for you to seek professional help and to enter a medically supervised weight management program, and/or undergo bariatric surgery.   Pain management considerations and possible limitations:  1.    Pharmacological Problems: Be advised that the use of opioid analgesics (oxycodone; hydrocodone; morphine; methadone; codeine; and all of their derivatives) have been associated with decreased metabolism and weight gain.  For this reason, should we see that you are unable to lose weight while taking these medications, it may become necessary for [place] to taper down and indefinitely discontinue  them.  2.    Technical Problems: The incidence of successful interventional therapies decreases as the patient's BMI increases. It is much more difficult to accomplish a safe and effective interventional therapy on a patient with a BMI above 35. 3.    Radiation Exposure Problems: The x-rays machine, used to accomplish injection therapies, will automatically increase their x-ray output in order to capture an appropriate bone image. This means that radiation exposure increases exponentially with the patient's BMI. (The higher the BMI, the higher the radiation exposure.) Although the level of radiation used at a given time is still safe to the patient, it is not for the physician and/or assisting staff. Unfortunately, radiation exposure is accumulative. Because physicians and the staff have to do procedures and be exposed on a daily basis, this can result in health problems such as cancer and radiation burns. Radiation exposure to the staff is monitored by the radiation batches that they wear. The exposure levels are reported back to the staff on a quarterly basis. Depending on levels of exposure,  physicians and staff may be obligated by law to decrease this exposure. This means that they have the right and obligation to refuse providing therapies where they may be overexposed to radiation. For this reason, physicians may decline to offer therapies such as radiofrequency ablation or implants to patients with a BMI above 40. 4.    Current Trends: Be advised that the current trend is to no longer offer certain therapies to patients with a BMI equal to, or above 35, due to increase perioperative risks, increased technical procedural difficulties, and excessive radiation exposure to healthcare personnel.  Last updated: 08/27/2023 ______________________________________________________________________

## 2023-09-28 ENCOUNTER — Telehealth: Payer: Self-pay

## 2023-09-28 NOTE — Telephone Encounter (Signed)
No problems post procedure. 

## 2023-10-09 ENCOUNTER — Other Ambulatory Visit: Payer: Self-pay | Admitting: *Deleted

## 2023-10-09 DIAGNOSIS — I4819 Other persistent atrial fibrillation: Secondary | ICD-10-CM

## 2023-10-09 MED ORDER — APIXABAN 5 MG PO TABS: 5.0000 mg | ORAL_TABLET | Freq: Two times a day (BID) | ORAL | 1 refills | Status: DC

## 2023-10-09 NOTE — Telephone Encounter (Signed)
Eliquis 5mg  refill request received. Patient is 72 years old, weight-156.5kg, Crea-0.92 on 08/22/23, Diagnosis-Afib, and last seen by Dr. Azucena Cecil on 03/29/23. Dose is appropriate based on dosing criteria. Will send in refill to requested pharmacy.

## 2023-10-18 ENCOUNTER — Ambulatory Visit: Payer: Medicare Other | Attending: Pain Medicine | Admitting: Pain Medicine

## 2023-10-18 ENCOUNTER — Telehealth: Payer: Self-pay | Admitting: Pain Medicine

## 2023-10-18 ENCOUNTER — Encounter: Payer: Self-pay | Admitting: Pain Medicine

## 2023-10-18 VITALS — BP 114/72 | HR 69 | Temp 97.2°F | Ht 71.0 in | Wt 345.0 lb

## 2023-10-18 DIAGNOSIS — M1612 Unilateral primary osteoarthritis, left hip: Secondary | ICD-10-CM | POA: Insufficient documentation

## 2023-10-18 DIAGNOSIS — M79605 Pain in left leg: Secondary | ICD-10-CM | POA: Insufficient documentation

## 2023-10-18 DIAGNOSIS — M79604 Pain in right leg: Secondary | ICD-10-CM | POA: Diagnosis not present

## 2023-10-18 DIAGNOSIS — R1032 Left lower quadrant pain: Secondary | ICD-10-CM | POA: Diagnosis not present

## 2023-10-18 DIAGNOSIS — Z09 Encounter for follow-up examination after completed treatment for conditions other than malignant neoplasm: Secondary | ICD-10-CM | POA: Diagnosis not present

## 2023-10-18 DIAGNOSIS — M25552 Pain in left hip: Secondary | ICD-10-CM | POA: Diagnosis not present

## 2023-10-18 DIAGNOSIS — M7918 Myalgia, other site: Secondary | ICD-10-CM | POA: Insufficient documentation

## 2023-10-18 DIAGNOSIS — G8929 Other chronic pain: Secondary | ICD-10-CM | POA: Diagnosis not present

## 2023-10-18 DIAGNOSIS — M545 Low back pain, unspecified: Secondary | ICD-10-CM | POA: Diagnosis not present

## 2023-10-18 NOTE — Telephone Encounter (Signed)
Bubble request sent to Dr. Laban Emperor.

## 2023-10-18 NOTE — Progress Notes (Signed)
Safety precautions to be maintained throughout the outpatient stay will include: orient to surroundings, keep bed in low position, maintain call bell within reach at all times, provide assistance with transfer out of bed and ambulation.  

## 2023-10-18 NOTE — Progress Notes (Signed)
PROVIDER NOTE: Information contained herein reflects review and annotations entered in association with encounter. Interpretation of such information and data should be left to medically-trained personnel. Information provided to patient can be located elsewhere in the medical record under "Patient Instructions". Document created using STT-dictation technology, any transcriptional errors that may result from process are unintentional.    Patient: Charles Floyd  Service Category: E/M  Provider: Oswaldo Done, MD  DOB: [birth year]  DOS: 10/18/2023  Referring Provider: Erasmo Downer, MD  MRN: [id number]  Specialty: Interventional Pain Management  PCP: Charles Downer, MD  Type: Established Patient  Setting: Ambulatory outpatient    Location: Office  Delivery: Face-to-face     HPI  Charles Floyd, a 72 y.o. year old male, is here today because of his Chronic left hip pain [M25.552, G89.29]. Charles Floyd. Tenner primary complain today is Hip Pain (left)  Pertinent problems: Charles Floyd. 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); Varicose veins of lower extremities (Bilateral); Chronic pain syndrome; Abnormal MRI, lumbar spine (01/20/2020); Lumbar lateral recess stenosis (Left: L2-3) (Right: L5-S1); Lumbosacral facet arthropathy (Multilevel) (Bilateral); Ligamentum flavum hypertrophy (L2-3, L3-4, L4-5); Lumbar intervertebral disc protrusion (Left: L2-3) (Central: L5-S1); Dextroscoliosis of lumbar spine; Chronic gluteal pain (1ry area of Pain) (Left); Chronic lower extremity pain (2ry area of Pain) (Bilateral) (L>R); Chronic low back pain (3ry area of Pain) (Bilateral) (L>R) w/o sciatica;  Numbness and tingling of lower extremity (intermittent) (Bilateral); Numbness and tingling in hands (intermittent) (Bilateral); Chronic elbow pain (4th area of Pain) (Bilateral) (R>L); Gout of big toe (Left); Chronic groin pain (Left); Osteoarthritis of hips (Bilateral); and Osteoarthritis of hip (Severe) (Left) on their pertinent problem list. Pain Assessment: Severity of Chronic pain is reported as a 9 /10. Location: Hip Left/pain radiaties down to his foot. Onset: More than a month ago. Quality: Tightness, Tingling, Aching, Constant. Timing: Constant. Modifying factor(s): heat, meds. Vitals:  height is 5\' 11"  (1.803 m) and weight is 345 lb (156.5 kg) (abnormal). His temperature is 72.0 F (36.2 C) (abnormal). His blood pressure is 114/72 and his pulse is 69. His oxygen saturation is 95%.  BMI: Estimated body mass index is 48.12 kg/m as calculated from the following:   Height as of this encounter: 5\' 11"  (1.803 m).   Weight as of this encounter: 345 lb (156.5 kg). Last encounter: 09/12/2023. Last procedure: 09/27/2023.  Reason for encounter: post-procedure evaluation and assessment.  The patient initially indicated having attained absolutely no relief of the pain from the diagnostic hip joint injections.  However further questioning revealed that he apparently was really not having a significant amount of pain while the local anesthetic was in place.  Because of his clear lack of understanding on the objectives that he needed to be looking for, today I have taken the time to go over the diagnostic role of the local anesthetic and steroids.  After I explained these to the patient he seems to be having a better understanding.  However there is also very clear to me that unless he does something to turn around the problem of his weight, we will be very limited as to what is it that we can offer him.  He is currently 345 pounds with  a BMI of 48.12 kg/m.  Today he comes in in a wheelchair primarily because  of his difficulty ambulating secondary to this pain and weight.  Based on today's evaluation, it would seem that the neck step is to get an MRI of his left hip joint.  Today we have ordered that and we will see him back once he has completed that diagnostic test.  Post-procedure evaluation   Procedure: Intra-articular hip injection  #1  Laterality: Left (-LT)  Approach: Percutaneous posterolateral approach. Level: Lower pelvic and hip joint level.  Imaging: Fluoroscopy-guided         Anesthesia: Local anesthesia (1-2% Lidocaine) Anxiolysis: IV Versed 2.0 mg Sedation: No Sedation                       DOS: 09/27/2023  Performed by: Oswaldo Done, MD  Purpose: Diagnostic/Therapeutic Indications: Hip pain severe enough to impact quality of life or function. Rationale (medical necessity): procedure needed and proper for the diagnosis and/or treatment of Charles Floyd. Charles Floyd medical symptoms and needs. 1. Chronic hip pain (Left)   2. Chronic groin pain (Left)   3. Chronic gluteal pain (1ry area of Pain) (Left)   4. Osteoarthritis of hip (Severe) (Left)   5. Chronic anticoagulation (Eliquis)   6. Class 3 severe obesity with serious comorbidity and body mass index (BMI) of 45.0 to 49.9 in adult, unspecified obesity type (HCC)   7. Chronic left hip pain   8. Chronic groin pain, left   9. Chronic gluteal pain   10. Primary osteoarthritis of left hip    NAS-11 Pain score:   Pre-procedure: 9 /10   Post-procedure: 0-No pain/10       Effectiveness:  Initial hour after procedure: 0 %. Subsequent 4-6 hours post-procedure: 0 %. Analgesia past initial 6 hours: 0 %. Ongoing improvement:  Analgesic: The patient indicated initially having attained absolutely no relief of the pain for the duration of local anesthetic.  However on his discharge pain score he had indicated having no pain.  Further questioning revealed that for the duration of the local anesthetic he seemed to not be having any of his  pain however he does not seem to understand yet the concept of these evaluations. Function: No benefit ROM: No benefit  Pharmacotherapy Assessment  Analgesic: None MME/day: 0 mg/day   Monitoring: [place] PMP: PDMP reviewed during this encounter.       Pharmacotherapy: No side-effects or adverse reactions reported. Compliance: No problems identified. Effectiveness: Clinically acceptable.  Brigitte Pulse, RN  10/18/2023  2:18 PM  Sign when Signing Visit Safety precautions to be maintained throughout the outpatient stay will include: orient to surroundings, keep bed in low position, maintain call bell within reach at all times, provide assistance with transfer out of bed and ambulation.     No results found for: "CBDTHCR" No results found for: "D8THCCBX" No results found for: "D9THCCBX"  UDS:  Summary  Date Value Ref Range Status  08/22/2023 Note  Final    Comment:    ==================================================================== Compliance Drug Analysis, Ur ==================================================================== Test                             Result       Flag       Units  Drug Present and Declared for Prescription Verification   Gabapentin  PRESENT      EXPECTED   Acetaminophen                  PRESENT      EXPECTED  Drug Absent but Declared for Prescription Verification   Metoprolol                     Not Detected UNEXPECTED ==================================================================== Test                      Result    Flag   Units      Ref Range   Creatinine              102              mg/dL      >=16 ==================================================================== Declared Medications:  The flagging and interpretation on this report are based on the  following declared medications.  Unexpected results may arise from  inaccuracies in the declared medications.   **Note: The testing scope of this panel includes these  medications:   Gabapentin (Neurontin)  Metoprolol (Toprol)   **Note: The testing scope of this panel does not include small to  moderate amounts of these reported medications:   Acetaminophen (Tylenol)   **Note: The testing scope of this panel does not include the  following reported medications:   Allopurinol (Zyloprim)  Apixaban (Eliquis) ==================================================================== For clinical consultation, please call [phone]. ====================================================================       ROS  Constitutional: Denies any fever or chills Gastrointestinal: No reported hemesis, hematochezia, vomiting, or acute GI distress Musculoskeletal: Denies any acute onset joint swelling, redness, loss of ROM, or weakness Neurological: No reported episodes of acute onset apraxia, aphasia, dysarthria, agnosia, amnesia, paralysis, loss of coordination, or loss of consciousness  Medication Review  Vitamin D3, acetaminophen, allopurinol, apixaban, ergocalciferol, gabapentin, and metoprolol succinate  History Review  Allergy: Charles Floyd. Zecher has No Known Allergies. Drug: Charles Floyd. Boulet  reports no history of drug use. Alcohol:  reports current alcohol use. Tobacco:  reports that he has never smoked. He has never used smokeless tobacco. Social: Charles Floyd. Bedore  reports that he has never smoked. He has never used smokeless tobacco. He reports current alcohol use. He reports that he does not use drugs. Medical:  has a past medical history of Arthritis, Asthma, Atrial fibrillation (HCC) (08/2019), Cancer of prostate (HCC) (2012), Gout, Gout, History of chicken pox, History of kidney stones, History of measles, History of mumps, Panic attacks, Panic attacks, and Sleep apnea. Surgical: Charles Floyd. [place]  has a past surgical history that includes Colonoscopy (Jan 2003, 12-17-13); kidney stone extraction (07/2013); Prostatectomy (05/16/2011); Prostate surgery (2012);  Cholecystectomy (N/A, 09/18/2017); Colonoscopy with propofol (N/A, 01/26/2021); Cardioversion (N/A, 07/13/2021); and Cataract extraction (Bilateral, 04/2021). Family: family history includes Anemia in his mother; CAD in his father and mother; Cancer in his mother; Colon cancer in his mother; Congestive Heart Failure in his father; Emphysema in his father; Tuberculosis in his father.  Laboratory Chemistry Profile   Renal Lab Results  Component Value Date   BUN 16 08/22/2023   CREATININE 0.92 08/22/2023   BCR 17 08/22/2023   GFRAA >60 09/02/2019   GFRNONAA >60 09/02/2019    Hepatic Lab Results  Component Value Date   AST 22 08/22/2023   ALT 14 03/16/2023   ALBUMIN 4.0 08/22/2023   ALKPHOS 68 08/22/2023   AMYLASE 18 (L) 08/23/2017   LIPASE 5 (L) 08/23/2017    Electrolytes Lab Results  Component Value Date   NA 143 08/22/2023   K 4.5 08/22/2023   CL 101 08/22/2023   CALCIUM 9.1 08/22/2023   MG 1.8 08/22/2023    Bone Lab Results  Component Value Date   VD25OH 14.8 (L) 03/24/2016   25OHVITD1 12 (L) 08/22/2023   25OHVITD2 1.0 08/22/2023   25OHVITD3 11 08/22/2023    Inflammation (CRP: Acute Phase) (ESR: Chronic Phase) Lab Results  Component Value Date   CRP 3 08/22/2023   ESRSEDRATE 16 08/22/2023         Note: Above Lab results reviewed.  Recent Imaging Review  DG PAIN CLINIC C-ARM 1-60 MIN NO REPORT Fluoro was used, but no Radiologist interpretation will be provided.  Please refer to "NOTES" tab for provider progress note. Note: Reviewed        Physical Exam  General appearance: Well nourished, well developed, and well hydrated. In no apparent acute distress Mental status: Alert, oriented x 3 (person, place, & time)       Respiratory: No evidence of acute respiratory distress Eyes: PERLA Vitals: BP 114/72   Pulse 69   Temp (!) 97.2 F (36.2 C)   Ht 5\' 11"  (1.803 m)   Wt (!) 345 lb (156.5 kg)   SpO2 95%   BMI 48.12 kg/m  BMI: Estimated body mass index is  48.12 kg/m as calculated from the following:   Height as of this encounter: 5\' 11"  (1.803 m).   Weight as of this encounter: 345 lb (156.5 kg). Ideal: Ideal body weight: 75.3 kg (166 lb 0.1 oz) Adjusted ideal body weight: 107.8 kg (237 lb 9.7 oz)  Assessment   Diagnosis Status  1. Chronic hip pain (Left)   2. Chronic groin pain (Left)   3. Chronic gluteal pain (1ry area of Pain) (Left)   4. Chronic lower extremity pain (2ry area of Pain) (Bilateral) (L>R)   5. Chronic low back pain (3ry area of Pain) (Bilateral) (L>R) w/o sciatica   6. Postop check   7. Osteoarthritis of hip (Severe) (Left)    Controlled Controlled Controlled   Updated Problems: No problems updated.  Plan of Care  Problem-specific:  No problem-specific Assessment & Plan notes found for this encounter.  Charles Floyd. DERRALL BRACCIA has a current medication list which includes the following long-term medication(s): allopurinol, apixaban, gabapentin, and metoprolol succinate.  Pharmacotherapy (Medications Ordered): No orders of the defined types were placed in this encounter.  Orders:  Orders Placed This Encounter  Procedures   Charles Floyd HIP LEFT WO CONTRAST    Standing Status:   Future    Standing Expiration Date:   01/18/2024    Scheduling Instructions:     Please make sure that the patient understands that this needs to be done as soon as possible. Never have the patient do the imaging "just before the next appointment". Inform patient that having the imaging done within the [facility] will expedite the availability of the results and will provide      imaging availability to the requesting physician. In addition inform the patient that the imaging order has an expiration date and will not be renewed if not done within the active period.    Order Specific Question:   What is the patient's sedation requirement?    Answer:   No Sedation    Order Specific Question:   Does the patient have a pacemaker or implanted devices?     Answer:   No    Order Specific Question:  Preferred imaging location?    Answer:   [facility] (table limit-350lbs)    Order Specific Question:   Call Results- Best Contact Number?    Answer:   [phone] [place] [facility]    Order Specific Question:   Radiology Contrast Protocol - do NOT remove file path    Answer:   [email or web address]   Nursing Instructions:    Please complete this patient's postprocedure evaluation.    Scheduling Instructions:     Please complete this patient's postprocedure evaluation.   Follow-up plan:   Return for (F2F), for review of ordered tests after getting results of MRI.      Interventional Therapies  Risk Factors  Considerations  Medical Comorbidities:  Eliquis Anticoagulation: (Stop: 3 days  Restart: 6 hours ) 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 Cardioversion)  Anxiety  panic attacks  Hx. Nephrolithiasis  Hx prostate cancer     Planned  Pending:   Diagnostic left anterolateral IA hip joint injection #1 (09/27/2023)    Under consideration:   Request for clearance to stop Eliquis (09/12/2023)  Diagnostic left anterolateral IA hip joint injection #1  Diagnostic bilateral lumbar facet MBB #1    Completed:   None at this time   Therapeutic  Palliative (PRN) options:   None established   Completed by other providers:   Therapeutic left L3-4 TFESI x1 (04/26/2020) by Filomena Jungling, MD [facility] PMR)  Therapeutic left L4-5 TFESI x2 (02/16/2020, 04/26/2020) by Filomena Jungling, MD [facility] PMR)  Therapeutic left L5-S1 TFESI x2 (02/16/2020, 08/18/2021) by Filomena Jungling, MD [facility] PMR)  Therapeutic left IA hip injection x1 (04/05/2020) by Filomena Jungling, MD [facility] PMR)          Recent Visits Date Type Provider Dept  10/18/23 Office Visit Delano Metz, MD [facility]  09/27/23 Procedure visit Delano Metz, MD [facility]   09/12/23 Office Visit Delano Metz, MD [facility]  08/22/23 Office Visit Delano Metz, MD [facility]  Showing recent visits within past 90 days and meeting all other requirements Future Appointments No visits were found meeting these conditions. Showing future appointments within next 90 days and meeting all other requirements  I discussed the assessment and treatment plan with the patient. The patient was provided an opportunity to ask questions and all were answered. The patient agreed with the plan and demonstrated an understanding of the instructions.  Patient advised to call back or seek an in-person evaluation if the symptoms or condition worsens.  Duration of encounter: 35 minutes.  Total time on encounter, as per AMA guidelines included both the face-to-face and non-face-to-face time personally spent by the physician and/or other qualified health care professional(s) on the day of the encounter (includes time in activities that require the physician or other qualified health care professional and does not include time in activities normally performed by clinical staff). Physician's time may include the following activities when performed: Preparing to see the patient (e.g., pre-charting review of records, searching for previously ordered imaging, lab work, and nerve conduction tests) Review of prior analgesic pharmacotherapies. Reviewing PMP Interpreting ordered tests (e.g., lab work, imaging, nerve conduction tests) Performing post-procedure evaluations, including interpretation of diagnostic procedures Obtaining and/or reviewing separately obtained history Performing a medically appropriate examination and/or evaluation Counseling and educating the patient/family/caregiver Ordering medications, tests, or procedures Referring and communicating with other health care professionals (when not separately reported) Documenting clinical information in the  electronic  or other health record Independently interpreting results (not separately reported) and communicating results to the patient/ family/caregiver Care coordination (not separately reported)  Note by: Oswaldo Done, MD Date: 10/18/2023; Time: 8:55 PM

## 2023-10-18 NOTE — Telephone Encounter (Signed)
PT has an appt for MRI on left hip, patient is claustrophobic and will need medication to be call into his pharmacy. PT appt is 11-05-23 at 11am . FYI

## 2023-11-05 ENCOUNTER — Ambulatory Visit
Admission: RE | Admit: 2023-11-05 | Discharge: 2023-11-05 | Disposition: A | Discharge status: Home / Self Care | Payer: Medicare Other | Source: Ambulatory Visit | Attending: Pain Medicine | Admitting: Pain Medicine

## 2023-11-05 DIAGNOSIS — R1032 Left lower quadrant pain: Secondary | ICD-10-CM | POA: Diagnosis not present

## 2023-11-05 DIAGNOSIS — M7918 Myalgia, other site: Secondary | ICD-10-CM | POA: Diagnosis not present

## 2023-11-05 DIAGNOSIS — M25552 Pain in left hip: Secondary | ICD-10-CM | POA: Diagnosis not present

## 2023-11-05 DIAGNOSIS — G8929 Other chronic pain: Secondary | ICD-10-CM | POA: Insufficient documentation

## 2023-11-05 DIAGNOSIS — M79604 Pain in right leg: Secondary | ICD-10-CM | POA: Diagnosis not present

## 2023-11-05 DIAGNOSIS — M79605 Pain in left leg: Secondary | ICD-10-CM | POA: Diagnosis not present

## 2023-11-05 DIAGNOSIS — M1612 Unilateral primary osteoarthritis, left hip: Secondary | ICD-10-CM | POA: Insufficient documentation

## 2023-11-05 DIAGNOSIS — S73192A Other sprain of left hip, initial encounter: Secondary | ICD-10-CM | POA: Diagnosis not present

## 2023-11-05 DIAGNOSIS — M25452 Effusion, left hip: Secondary | ICD-10-CM | POA: Diagnosis not present

## 2023-11-09 ENCOUNTER — Encounter: Payer: Self-pay | Admitting: Family Medicine

## 2023-11-09 ENCOUNTER — Telehealth (INDEPENDENT_AMBULATORY_CARE_PROVIDER_SITE_OTHER): Payer: Medicare Other | Admitting: Family Medicine

## 2023-11-09 DIAGNOSIS — N3000 Acute cystitis without hematuria: Secondary | ICD-10-CM

## 2023-11-09 MED ORDER — SULFAMETHOXAZOLE-TRIMETHOPRIM 800-160 MG PO TABS: 1.0000 | ORAL_TABLET | Freq: Two times a day (BID) | ORAL | 0 refills | Status: AC

## 2023-11-09 NOTE — Progress Notes (Signed)
MyChart Video Visit    Virtual Visit via Video Note   This format is felt to be most appropriate for this patient at this time. Physical exam was limited by quality of the video and audio technology used for the visit.    Patient location: home Provider location: [facility] Persons involved in the visit: patient, provider  I discussed the limitations of evaluation and management by telemedicine and the availability of in person appointments. The patient expressed understanding and agreed to proceed.  Patient: Charles Floyd   DOB: [birth year]   72 y.o. Male  MRN: [id number] Visit Date: 11/09/2023  Today's healthcare provider: Shirlee Latch, MD   No chief complaint on file.  Subjective    HPI   Discussed the use of AI scribe software for clinical note transcription with the patient, who gave verbal consent to proceed.  History of Present Illness   The patient, with a history of urinary incontinence requiring the use of a pad, presents with worsening urinary symptoms over the last several days. He reports increased frequency, with hourly trips to the bathroom, and urgency, sometimes not making it to the bathroom in time. He also describes a new onset of dysuria, particularly at the end of urination, and dark urine. He has been limiting his intake to water with no improvement in symptoms. He denies any previous history of urinary tract infections.       Review of Systems      Objective    There were no vitals taken for this visit.     Physical Exam Constitutional:      General: He is not in acute distress.    Appearance: Normal appearance. He is not diaphoretic.  HENT:     Head: Normocephalic.  Eyes:     Conjunctiva/sclera: Conjunctivae normal.  Pulmonary:     Effort: Pulmonary effort is normal. No respiratory distress.  Neurological:     Mental Status: He is alert and oriented to person, place, and time. Mental status is at baseline.         Assessment & Plan     Problem List Items Addressed This Visit   None Visit Diagnoses     Acute cystitis without hematuria    -  Primary           Urinary Tract Infection (UTI) Presents with increased urinary frequency, urgency, and dysuria over the past several days. Urine noted to be dark. No prior history of UTI. Symptoms suggestive of UTI. Agreed to treat empirically with antibiotics and consider urine culture if symptoms do not improve by Monday (unable to get into the office to give a sample today). Emphasized the importance of completing the full course of antibiotics. - Prescribe Bactrim (trimethoprim/sulfamethoxazole) DS 1 tablet twice a day for 7 days - Advise to take the antibiotic with food to avoid gastrointestinal upset - Order urine culture if symptoms do not improve by Monday - Instruct to contact the office if symptoms worsen or do not improve.       Meds ordered this encounter  Medications   sulfamethoxazole-trimethoprim (BACTRIM DS) 800-160 MG tablet    Sig: Take 1 tablet by mouth 2 (two) times daily for 7 days.    Dispense:  14 tablet    Refill:  0     Return if symptoms worsen or fail to improve.     I discussed the assessment and treatment plan with the patient. The patient was provided an opportunity  to ask questions and all were answered. The patient agreed with the plan and demonstrated an understanding of the instructions.   The patient was advised to call back or seek an in-person evaluation if the symptoms worsen or if the condition fails to improve as anticipated.   Shirlee Latch, MD [facility] [phone] (phone) [phone] (fax)  [facility]

## 2023-12-18 ENCOUNTER — Encounter: Payer: Self-pay | Admitting: Family Medicine

## 2023-12-18 ENCOUNTER — Telehealth (INDEPENDENT_AMBULATORY_CARE_PROVIDER_SITE_OTHER): Payer: Medicare Other | Admitting: Family Medicine

## 2023-12-18 DIAGNOSIS — N3000 Acute cystitis without hematuria: Secondary | ICD-10-CM

## 2023-12-18 DIAGNOSIS — M1612 Unilateral primary osteoarthritis, left hip: Secondary | ICD-10-CM

## 2023-12-18 MED ORDER — SULFAMETHOXAZOLE-TRIMETHOPRIM 800-160 MG PO TABS: 1.0000 | ORAL_TABLET | Freq: Two times a day (BID) | ORAL | 0 refills | Status: AC

## 2023-12-18 NOTE — Progress Notes (Signed)
 MyChart Video Visit    Virtual Visit via Video Note   This format is felt to be most appropriate for this patient at this time. Physical exam was limited by quality of the video and audio technology used for the visit.    Patient location: home Provider location: [facility] Persons involved in the visit: patient, provider  I discussed the limitations of evaluation and management by telemedicine and the availability of in person appointments. The patient expressed understanding and agreed to proceed.  Patient: Charles Floyd   DOB: [birth year]   73 y.o. Male  MRN: [id number] Visit Date: 12/18/2023  Today's healthcare provider: Jon Eva, MD   No chief complaint on file.  Subjective    HPI   Discussed the use of AI scribe software for clinical note transcription with the patient, who gave verbal consent to proceed.  History of Present Illness   The patient, with a history of recurrent urinary tract infections and incontinence, presents with worsening urinary symptoms. He describes a significant increase in urinary frequency and urgency, with an inability to 'stand up without flooding himself.' He reports dark urine but denies dysuria. Despite drinking large volumes of water, he continues to have significant urinary output. The patient also reports extreme fatigue, to the point of being unable to leave the house.  In addition to his urinary symptoms, the patient also reports severe left hip pain, which has significantly limited his mobility and overall quality of life. The pain has recently become sharp, and he finds relief only by remaining in a position that doesn't exacerbate the pain. He recently had an MRI of the hip, which he reports showed 'bone on bone' changes.       Review of Systems      Objective    There were no vitals taken for this visit.      Physical Exam Constitutional:      General: He is not in acute distress.     Appearance: Normal appearance. He is not diaphoretic.  HENT:     Head: Normocephalic.  Eyes:     Conjunctiva/sclera: Conjunctivae normal.  Pulmonary:     Effort: Pulmonary effort is normal. No respiratory distress.  Neurological:     Mental Status: He is alert and oriented to person, place, and time. Mental status is at baseline.        Assessment & Plan     Problem List Items Addressed This Visit       Musculoskeletal and Integument   Osteoarthritis of hip (Severe) (Left) (Chronic)   Other Visit Diagnoses       Acute cystitis without hematuria    -  Primary   Relevant Orders   Urine Culture          Urinary Tract Infection (UTI) Recurrent UTI with symptoms of frequency, urgency, incontinence, dark urine, and significant fatigue. Symptoms have worsened since last week. Previous treatment with Bactrim  was effective. Discussed risks of untreated UTI, including potential kidney infection. Benefits of Bactrim  include symptom relief and infection clearance. Immediate treatment preferred due to severity of symptoms. - Prescribe Bactrim  1 tablet twice a day for 7 days - Order urine culture if symptoms do not improve by Thursday - Advise to drop off a urine sample if symptoms persist  Severe Osteoarthritis of the Left Hip MRI indicates severe osteoarthritis with subchondral edema, osteophytes, cartilage degeneration, and joint effusion. Reports significant pain and loss of mobility, impacting daily function. Discussed potential hip  replacement versus pain management strategies. Risks of hip replacement include surgical complications and recovery time. Benefits include significant pain relief and improved mobility. Pain management may delay but not prevent need for surgery. Patient prefers to consult pain management specialist first but is open to orthopedic evaluation if necessary. - Send message to pain management specialist for assessment and potential referral to orthopedics -  Consider referral to orthopedics for evaluation of hip replacement candidacy if pain management is insufficient.       Meds ordered this encounter  Medications   sulfamethoxazole -trimethoprim  (BACTRIM  DS) 800-160 MG tablet    Sig: Take 1 tablet by mouth 2 (two) times daily for 7 days.    Dispense:  14 tablet    Refill:  0     No follow-ups on file.     I discussed the assessment and treatment plan with the patient. The patient was provided an opportunity to ask questions and all were answered. The patient agreed with the plan and demonstrated an understanding of the instructions.   The patient was advised to call back or seek an in-person evaluation if the symptoms worsen or if the condition fails to improve as anticipated.   Jon Eva, MD [facility] [phone] (phone) [phone] (fax)  [facility]

## 2024-01-07 ENCOUNTER — Encounter: Payer: Self-pay | Admitting: Pain Medicine

## 2024-01-31 ENCOUNTER — Other Ambulatory Visit: Payer: Self-pay | Admitting: *Deleted

## 2024-01-31 MED ORDER — METOPROLOL SUCCINATE ER 25 MG PO TB24: 25.0000 mg | ORAL_TABLET | Freq: Every day | ORAL | 0 refills | Status: DC

## 2024-02-18 ENCOUNTER — Encounter: Payer: Self-pay | Admitting: Family Medicine

## 2024-02-18 ENCOUNTER — Ambulatory Visit: Payer: Self-pay | Admitting: Family Medicine

## 2024-02-18 NOTE — Telephone Encounter (Signed)
  Chief Complaint: urinary symptoms Symptoms: increased frequency, burning with urination, dribbling, dark urine Frequency: Ongoing since January Pertinent Negatives: Patient denies fever, blood in urine, flank pain Disposition: [] ED /[] Urgent Care (no appt availability in office) / [x] Appointment(In office/virtual)/ []  [facility]/ [] Home Care/ [] Refused Recommended Disposition /[] [facility]/ []  Follow-up with PCP Additional Notes: Patient calls reporting ongoing urinary symptoms that began in Jan 2025. Patient reports he has completed several rounds of abx with no relief. Per protocol, patient to be evaluated within 24 hours. First available appointment with PCP outside of guideline. Patient scheduled with first available provider in clinic for 02/19/24 @ 1100. Care advice reviewed, patient verbalized understanding and denies further questions at this time. Alerting PCP for review.    Copied from CRM [id number]. Topic: Clinical - Red Word Triage >> Feb 18, 2024 10:22 AM Elle L wrote: Red Word that prompted transfer to Nurse Triage: The patient's wife, Charles Floyd, states that the patient is having painful reoccurring kidney and bladder infections. He has finished two rounds of medications for it with no improvement. Reason for Disposition  Urinating more frequently than usual (i.e., frequency)  Answer Assessment - Initial Assessment Questions 1. SYMPTOM: "What's the main symptom you're concerned about?" (e.g., frequency, incontinence)     Frequent urination, sometimes urinating on himself 2. ONSET: "When did the  symptoms  start?"     January, reoccurring 3. PAIN: "Is there any pain?" If Yes, ask: "How bad is it?" (Scale: 1-10; mild, moderate, severe)     6/10 4. CAUSE: "What do you think is causing the symptoms?"     UTI, bladder infection 5. OTHER SYMPTOMS: "Do you have any other symptoms?" (e.g., blood in urine, fever, flank pain, pain with urination)      Increased frequency, dark urine, burning with urination  Protocols used: Urinary Symptoms-A-AH

## 2024-02-19 ENCOUNTER — Ambulatory Visit (INDEPENDENT_AMBULATORY_CARE_PROVIDER_SITE_OTHER): Admitting: Physician Assistant

## 2024-02-19 ENCOUNTER — Encounter: Payer: Self-pay | Admitting: Physician Assistant

## 2024-02-19 VITALS — BP 103/72 | HR 74 | Temp 97.2°F | Resp 16 | Ht 71.0 in | Wt 337.0 lb

## 2024-02-19 DIAGNOSIS — S81801A Unspecified open wound, right lower leg, initial encounter: Secondary | ICD-10-CM

## 2024-02-19 DIAGNOSIS — K5909 Other constipation: Secondary | ICD-10-CM | POA: Diagnosis not present

## 2024-02-19 DIAGNOSIS — M169 Osteoarthritis of hip, unspecified: Secondary | ICD-10-CM

## 2024-02-19 DIAGNOSIS — S81801D Unspecified open wound, right lower leg, subsequent encounter: Secondary | ICD-10-CM | POA: Diagnosis not present

## 2024-02-19 DIAGNOSIS — M1612 Unilateral primary osteoarthritis, left hip: Secondary | ICD-10-CM | POA: Diagnosis not present

## 2024-02-19 DIAGNOSIS — R399 Unspecified symptoms and signs involving the genitourinary system: Secondary | ICD-10-CM | POA: Diagnosis not present

## 2024-02-19 DIAGNOSIS — N390 Urinary tract infection, site not specified: Secondary | ICD-10-CM

## 2024-02-19 NOTE — Progress Notes (Unsigned)
 Established patient visit  Patient: Charles Floyd   DOB: [birth year]   73 y.o. Male  MRN: [id number] Visit Date: 02/19/2024  Today's healthcare provider: Debera Lat, PA-C   Chief Complaint  Patient presents with   Urinary Urgency    F/u Urinary concerns .Marland Kitchen Sore on rt leg that has healed numbness and tingling MRI about his HIp   Subjective     HPI     Urinary Urgency    Additional comments: F/u Urinary concerns .Marland Kitchen Sore on rt leg that has healed numbness and tingling MRI about his HIp      Last edited by Liz Beach, CMA on 02/19/2024 10:54 AM.       Discussed the use of AI scribe software for clinical note transcription with the patient, who gave verbal consent to proceed.  History of Present Illness               02/19/2024   11:05 AM 09/27/2023    9:36 AM 09/17/2023   11:18 AM  Depression screen PHQ 2/9  Decreased Interest 1 0 1  Down, Depressed, Hopeless 1 0 1  PHQ - 2 Score 2 0 2  Altered sleeping 1 0 1  Tired, decreased energy 1 0 1  Change in appetite 0 0 0  Feeling bad or failure about yourself  1 0 0  Trouble concentrating 1 0 1  Moving slowly or fidgety/restless 0 0 1  Suicidal thoughts 0 0 0  PHQ-9 Score 6 0 6  Difficult doing work/chores Somewhat difficult  Somewhat difficult      02/19/2024   11:05 AM  GAD 7 : Generalized Anxiety Score  Nervous, Anxious, on Edge 1  Control/stop worrying 1  Worry too much - different things 1  Trouble relaxing 1  Restless 0  Easily annoyed or irritable 1  Afraid - awful might happen 1  Total GAD 7 Score 6  Anxiety Difficulty Somewhat difficult    Medications: Outpatient Medications Prior to Visit  Medication Sig   acetaminophen (TYLENOL) 650 MG CR tablet Take 1,300 mg by mouth every 8 (eight) hours as needed for pain.   allopurinol (ZYLOPRIM) 300 MG tablet TAKE 1 TABLET BY MOUTH EVERY DAY   apixaban (ELIQUIS) 5 MG TABS tablet Take 1 tablet (5 mg total) by mouth 2 (two) times daily.   gabapentin  (NEURONTIN) 300 MG capsule Take 1 capsule (300 mg total) by mouth 3 (three) times daily.   metoprolol succinate (TOPROL XL) 25 MG 24 hr tablet Take 1 tablet (25 mg total) by mouth daily.   No facility-administered medications prior to visit.    Review of Systems All negative Except see HPI   {Insert previous labs (optional):23779} {See past labs  Heme  Chem  Endocrine  Serology  Results Review (optional):1}   Objective    BP 103/72 (BP Location: Left Arm, Patient Position: Sitting, Cuff Size: Large)   Pulse 74   Temp (!) 97.2 F (36.2 C) (Oral)   Resp 16   Ht 5\' 11"  (1.803 m)   Wt (!) 337 lb (152.9 kg)   SpO2 97%   BMI 47.00 kg/m  {Insert last BP/Wt (optional):23777}{See vitals history (optional):1}   Physical Exam   No results found for any visits on 02/19/24.      Assessment and Plan             No orders of the defined types were placed in this encounter.   No follow-ups on file.  The patient was advised to call back or seek an in-person evaluation if the symptoms worsen or if the condition fails to improve as anticipated.  I discussed the assessment and treatment plan with the patient. The patient was provided an opportunity to ask questions and all were answered. The patient agreed with the plan and demonstrated an understanding of the instructions.  I, Debera Lat, PA-C have reviewed all documentation for this visit. The documentation on 02/19/2024  for the exam, diagnosis, procedures, and orders are all accurate and complete.  Debera Lat, [facility], MMS [facility] [phone] (phone) [phone] (fax)  [facility]

## 2024-02-20 ENCOUNTER — Encounter: Payer: Self-pay | Admitting: Physician Assistant

## 2024-02-20 DIAGNOSIS — N390 Urinary tract infection, site not specified: Secondary | ICD-10-CM | POA: Insufficient documentation

## 2024-02-20 DIAGNOSIS — K5909 Other constipation: Secondary | ICD-10-CM | POA: Insufficient documentation

## 2024-02-20 DIAGNOSIS — S81801A Unspecified open wound, right lower leg, initial encounter: Secondary | ICD-10-CM | POA: Insufficient documentation

## 2024-02-20 LAB — POCT URINALYSIS DIPSTICK
Bilirubin, UA: NEGATIVE
Blood, UA: NEGATIVE
Glucose, UA: NEGATIVE
Ketones, UA: NEGATIVE
Leukocytes, UA: NEGATIVE
Nitrite, UA: NEGATIVE
Protein, UA: NEGATIVE
Spec Grav, UA: 1.02 (ref 1.010–1.025)
Urobilinogen, UA: 0.2 U/dL
pH, UA: 6 (ref 5.0–8.0)

## 2024-02-20 LAB — URINALYSIS, MICROSCOPIC ONLY
Casts: NONE SEEN /LPF
WBC, UA: >30 /HPF — AB (ref 0–5)

## 2024-02-20 MED ORDER — SULFAMETHOXAZOLE-TRIMETHOPRIM 800-160 MG PO TABS: 1.0000 | ORAL_TABLET | Freq: Two times a day (BID) | ORAL | 0 refills | Status: DC

## 2024-02-20 NOTE — Progress Notes (Signed)
 Antibiotic will be sent to your pharmacy. Charles Floyd has an UTI. Please, contact me as soon as possible regarding a name for a general surgeon unless you are able to contact him directly and schedule an urgent appointment.

## 2024-02-21 NOTE — Progress Notes (Signed)
 If pt prefers who can contact his surgeon directly, he needs to let us know

## 2024-02-22 ENCOUNTER — Encounter: Payer: Self-pay | Admitting: Physician Assistant

## 2024-02-22 LAB — URINE CULTURE

## 2024-03-05 DIAGNOSIS — L989 Disorder of the skin and subcutaneous tissue, unspecified: Secondary | ICD-10-CM | POA: Diagnosis not present

## 2024-03-06 ENCOUNTER — Telehealth: Payer: Self-pay | Admitting: Cardiology

## 2024-03-06 ENCOUNTER — Ambulatory Visit: Payer: Self-pay | Admitting: Surgery

## 2024-03-06 NOTE — Telephone Encounter (Signed)
   Pre-operative Risk Assessment    Patient Name: Charles Floyd  DOB: [birth year] MRN: [id number]   Date of last office visit: unkown Date of next office visit: unknown   Request for Surgical Clearance    Procedure:   Excision,lesion,lower leg (RT)  Date of Surgery:  Clearance 04/11/24                                Surgeon:  Dr. Tonna Boehringer Surgeon's Group or Practice Name:  [facility] Phone number:  [phone] Fax number:  [phone]   Type of Clearance Requested:   - Pharmacy:  Hold Apixaban (Eliquis) no instructions   Type of Anesthesia:  Not Indicated   Additional requests/questions:    Burnett Sheng   03/06/2024, 11:09 AM

## 2024-03-06 NOTE — H&P (Signed)
 Subjective:   CC: Skin lesion of right leg [L98.9]  HPI:  Charles Floyd is a 73 y.o. male who was referred by Debera Lat, PA for evaluation of above. First noted 6 months ago.  Trauma to area and though it was healing but now has started to form a palpable lump.  No reported discharge.   Past Medical History:  has a past medical history of A-fib (CMS/HHS-HCC) (08/2019), Arthritis, Asthma (HHS-HCC), Gout, History of chicken pox, History of kidney stones, History of measles, History of mumps, Panic attacks, Prostate cancer (CMS/HHS-HCC) (2012), and Sleep apnea.  Past Surgical History:  has a past surgical history that includes prostate removal (05/16/2011); kidney stone extraction (2015); Laparoscopic Prostatectomy (05/2011); Lumbar epidural injection (02/16/2020); cholecystectomy (09/18/2017); Colonoscopy (12/2001); Colonoscopy; Colonoscopy (01/26/2021); extraction cataract extracapsular w/insertion intraocular prosthesis (Right, 03/22/2021); and extraction cataract extracapsular w/insertion intraocular prosthesis (Left, 04/05/2021).  Family History: family history includes Anemia in his mother; Breast cancer in his mother; Colon cancer in his mother; Coronary Artery Disease (Blocked arteries around heart) in his father and mother; Emphysema in his father; Heart disease in his father; Heart failure in his father; Stroke in his mother; Tuberculosis in his father.  Social History:  reports that he has never smoked. He has never used smokeless tobacco. He reports current alcohol use. He reports that he does not use drugs.  Current Medications: has a current medication list which includes the following prescription(s): acetaminophen, allopurinol, apixaban, bisacodyl, fluticasone propionate, gabapentin, hydrocodone-acetaminophen, metoprolol succinate, metoprolol tartrate, polyethylene glycol, promethazine-dextromethorphan, and tramadol.  Allergies:  No Known Allergies  ROS:  A 15 point review of  systems was performed and pertinent positives and negatives noted in HPI   Objective:     BP 92/65   Pulse 89   Ht 181.6 cm (5' 11.5")   Wt (!) 152.9 kg (337 lb)   BMI 46.35 kg/m   Constitutional :  No distress, cooperative, alert  Lymphatics/Throat:  Supple with no lymphadenopathy  Respiratory:  Clear to auscultation bilaterally  Cardiovascular:  Regular rate and rhythm  Gastrointestinal: Soft, non-tender, non-distended, no organomegaly.  Musculoskeletal: Steady gait and movement  Skin: Cool and moist, right shin area, 2 x 2 raised scaly lesion that is slightly tender, non-fluctuant, no surrounding erythema, extensive dry skin. surgical scars  Psychiatric: Normal affect, non-agitated, not confused         LABS:  N/a   RADS: N/a  Assessment:      Skin lesion of right leg [L98.9] Inicisional biopsy at minium to determine etiology Ortho possible hip surgery so will try to avoid large wound Plan:     1. Skin lesion of right leg [L98.9] Discussed surgical excision.  Alternatives include continued observation.  Benefits include possible symptom relief, pathologic evaluation, improved cosmesis. Discussed the risk of surgery including recurrence, chronic pain, post-op infxn, poor cosmesis, poor/delayed wound healing, and possible re-operation to address said risks. The risks of general anesthetic, if used, includes MI, CVA, sudden death or even reaction to anesthetic medications also discussed.  Typical post-op recovery time of 3-5 days with possible activity restrictions were also discussed.  The patient verbalized understanding and all questions were answered to the patient's satisfaction.  2. Patient has elected to proceed with surgical treatment. Procedure will be scheduled. Hold eliquis 3 days.   labs/images/medications/previous chart entries reviewed personally and relevant changes/updates noted above.

## 2024-03-06 NOTE — Telephone Encounter (Signed)
 Please advise holding Eliquis prior to excision of lesion right lower leg.   Thank you!  DW

## 2024-03-07 ENCOUNTER — Telehealth: Payer: Self-pay

## 2024-03-07 NOTE — Telephone Encounter (Signed)
  Patient Consent for Virtual Visit        Charles Floyd has provided verbal consent on 03/07/2024 for a virtual visit (video or telephone). Med rec complete. Patient has a scheduled appointment on 03/19/2024 @ 9:20am   CONSENT FOR VIRTUAL VISIT FOR:  Charles Floyd  By participating in this virtual visit I agree to the following:  I hereby voluntarily request, consent and authorize [facility] and its employed or contracted physicians, physician assistants, nurse practitioners or other licensed health care professionals (the Practitioner), to provide me with telemedicine health care services (the "Services") as deemed necessary by the treating Practitioner. I acknowledge and consent to receive the Services by the Practitioner via telemedicine. I understand that the telemedicine visit will involve communicating with the Practitioner through live audiovisual communication technology and the disclosure of certain medical information by electronic transmission. I acknowledge that I have been given the opportunity to request an in-person assessment or other available alternative prior to the telemedicine visit and am voluntarily participating in the telemedicine visit.  I understand that I have the right to withhold or withdraw my consent to the use of telemedicine in the course of my care at any time, without affecting my right to future care or treatment, and that the Practitioner or I may terminate the telemedicine visit at any time. I understand that I have the right to inspect all information obtained and/or recorded in the course of the telemedicine visit and may receive copies of available information for a reasonable fee.  I understand that some of the potential risks of receiving the Services via telemedicine include:  Delay or interruption in medical evaluation due to technological equipment failure or disruption; Information transmitted may not be sufficient (e.g. poor resolution  of images) to allow for appropriate medical decision making by the Practitioner; and/or  In rare instances, security protocols could fail, causing a breach of personal health information.  Furthermore, I acknowledge that it is my responsibility to provide information about my medical history, conditions and care that is complete and accurate to the best of my ability. I acknowledge that Practitioner's advice, recommendations, and/or decision may be based on factors not within their control, such as incomplete or inaccurate data provided by me or distortions of diagnostic images or specimens that may result from electronic transmissions. I understand that the practice of medicine is not an exact science and that Practitioner makes no warranties or guarantees regarding treatment outcomes. I acknowledge that a copy of this consent can be made available to me via my patient portal [facility] MyChart), or I can request a printed copy by calling the office of [facility].    I understand that my insurance will be billed for this visit.   I have read or had this consent read to me. I understand the contents of this consent, which adequately explains the benefits and risks of the Services being provided via telemedicine.  I have been provided ample opportunity to ask questions regarding this consent and the Services and have had my questions answered to my satisfaction. I give my informed consent for the services to be provided through the use of telemedicine in my medical care

## 2024-03-07 NOTE — Telephone Encounter (Signed)
 Patient with diagnosis of A Fib on Eliquis for anticoagulation.    Procedure:  Excision,lesion,lower leg (RT)  Date of procedure: 04/11/24   CHA2DS2-VASc Score = 1  This indicates a 0.6% annual risk of stroke. The patient's score is based upon: CHF History: 0 HTN History: 0 Diabetes History: 0 Stroke History: 0 Vascular Disease History: 0 Age Score: 1 Gender Score: 0    CrCl 108 ml/min   Per office protocol, patient can hold Eliquis for 2 days prior to procedure.  .  **This guidance is not considered finalized until pre-operative APP has relayed final recommendations.**

## 2024-03-07 NOTE — Telephone Encounter (Signed)
   Name: Charles Floyd  DOB: [birth year]  MRN: [id number]  Primary Cardiologist: Debbe Odea, MD   Preoperative team, please contact this patient and set up a phone call appointment for further preoperative risk assessment. Please obtain consent and complete medication review. Thank you for your help.  I confirm that guidance regarding antiplatelet and oral anticoagulation therapy has been completed and, if necessary, noted below.  Per Pharm D, patient may hold Eliquis for 2 days prior to procedure.    I also confirmed the patient resides in the state of [place]. As per [facility] Medical Board telemedicine laws, the patient must reside in the state in which the provider is licensed.   [name], NP 03/07/2024, 7:54 AM [facility]

## 2024-03-07 NOTE — Telephone Encounter (Signed)
 Patient has a scheduled pre-op clearance tele visit appointment on 03/19/2024 @ 9:20am.

## 2024-03-19 ENCOUNTER — Ambulatory Visit: Attending: Nurse Practitioner

## 2024-03-19 DIAGNOSIS — Z01810 Encounter for preprocedural cardiovascular examination: Secondary | ICD-10-CM | POA: Diagnosis not present

## 2024-03-19 NOTE — Progress Notes (Signed)
 Virtual Visit via Telephone Note   Because of Charles Floyd co-morbid illnesses, he is at least at moderate risk for complications without adequate follow up.  This format is felt to be most appropriate for this patient at this time.  Due to technical limitations with video connection (technology), today's appointment will be conducted as an audio only telehealth visit, and CLEON SIGNORELLI verbally agreed to proceed in this manner.   All issues noted in this document were discussed and addressed.  No physical exam could be performed with this format.  Evaluation Performed:  Preoperative cardiovascular risk assessment _____________   Date:  03/19/2024   Patient ID:  Charles Floyd, DOB 1951-08-01, MRN [id number] Patient Location:  Home Provider location:   Office  Primary Care Provider:  Erasmo Downer, MD Primary Cardiologist:  Debbe Odea, MD  Chief Complaint / Patient Profile   73 y.o. y/o male with a h/o f OSA, permanent A-fib (s/p failed DC cardioversion), OSA, obesity   who is pending excision of lower leg lesion and presents today for telephonic preoperative cardiovascular risk assessment.  History of Present Illness    Charles Floyd is a 73 y.o. male who presents via audio/video conferencing for a telehealth visit today.  Pt was last seen in cardiology clinic on 03/29/2023 by Dr. Azucena Cecil.  At that time DYKE WEIBLE was doing well with no new cardiac concerns at that time.  The patient is now pending procedure as outlined above. Since his last visit, he has been doing well from a cardiac perspective.  He has been limited in activity due to hip pain and his leg lesion.  He is able to complete 4 METS of activity but at a very slow pace due to arthritic hip pain.  He reports that he is unable to bend down due to his hip which limits his ability to complete several tasks.  He reports compliance with his current medications and denies any adverse  reactions.   He denies chest pain, shortness of breath, lower extremity edema, fatigue, palpitations, melena, hematuria, hemoptysis, diaphoresis, weakness, presyncope, syncope, orthopnea, and PND.    Past Medical History    Past Medical History:  Diagnosis Date   Arthritis    BACK   Asthma    AS A CHILD-NO INHALERS AS AN ADULT   Atrial fibrillation (HCC) 08/2019   Cancer of prostate [facility]) 2012   Robotic Assisted prostatectomy 2012 [facility]   Gout    Gout    History of chicken pox    History of kidney stones    H/O   History of measles    History of mumps    Panic attacks    Panic attacks    Sleep apnea    using CPAP occasionally    Past Surgical History:  Procedure Laterality Date   CARDIOVERSION N/A 07/13/2021   Procedure: CARDIOVERSION;  Surgeon: Debbe Odea, MD;  Location: [facility] ORS;  Service: Cardiovascular;  Laterality: N/A;   CATARACT EXTRACTION Bilateral 04/2021   CHOLECYSTECTOMY N/A 09/18/2017   Procedure: LAPAROSCOPIC CHOLECYSTECTOMY WITH INTRAOPERATIVE CHOLANGIOGRAM;  Surgeon: Earline Mayotte, MD;  Location: [facility] ORS;  Service: General;  Laterality: N/A;   COLONOSCOPY  Jan 2003, 12-17-13   Dr Lemar Livings   COLONOSCOPY WITH PROPOFOL N/A 01/26/2021   Procedure: COLONOSCOPY WITH PROPOFOL;  Surgeon: Earline Mayotte, MD;  Location: [facility] ENDOSCOPY;  Service: Endoscopy;  Laterality: N/A;  COVID POSITIVE 01/03/2021   kidney stone extraction  07/2013  stent placed   PROSTATE SURGERY  2012   PROSTATECTOMY  05/16/2011   [facility]. Abdominal Laparoscopic, robotic assisted    Allergies  No Known Allergies  Home Medications    Prior to Admission medications   Medication Sig Start Date End Date Taking? Authorizing Provider  acetaminophen (TYLENOL) 650 MG CR tablet Take 1,300 mg by mouth every 8 (eight) hours as needed for pain.    [provider]  allopurinol (ZYLOPRIM) 300 MG tablet TAKE 1 TABLET BY MOUTH EVERY DAY 09/12/23   Bacigalupo, Stan Eans, MD   apixaban (ELIQUIS) 5 MG TABS tablet Take 1 tablet (5 mg total) by mouth 2 (two) times daily. 10/09/23   Constancia Delton, MD  gabapentin (NEURONTIN) 300 MG capsule Take 1 capsule (300 mg total) by mouth 3 (three) times daily. 06/18/23   Bacigalupo, Angela M, MD  metoprolol succinate (TOPROL XL) 25 MG 24 hr tablet Take 1 tablet (25 mg total) by mouth daily. 01/31/24   Constancia Delton, MD    Physical Exam    Vital Signs:  Gaston Karvonen does not have vital signs available for review today.  Given telephonic nature of communication, physical exam is limited. AAOx3. NAD. Normal affect.  Speech and respirations are unlabored.  Accessory Clinical Findings    None  Assessment & Plan    1.  Preoperative Cardiovascular Risk Assessment: - Patient's RCRI score is 0.9%  The patient affirms he has been doing well without any new cardiac symptoms. They are able to achieve 4 METS without cardiac limitations. Therefore, based on ACC/AHA guidelines, the patient would be at acceptable risk for the planned procedure without further cardiovascular testing. The patient was advised that if he develops new symptoms prior to surgery to contact our office to arrange for a follow-up visit, and he verbalized understanding.   The patient was advised that if he develops new symptoms prior to surgery to contact our office to arrange for a follow-up visit, and he verbalized understanding.  Per office protocol, patient can hold Eliquis for 2 days prior to procedure.  A copy of this note will be routed to requesting surgeon.  Time:   Today, I have spent 6 minutes with the patient with telehealth technology discussing medical history, symptoms, and management plan.     Francene Ing, Retha Cast, NP  03/19/2024, 7:12 AM

## 2024-03-20 ENCOUNTER — Encounter: Payer: Self-pay | Admitting: Family Medicine

## 2024-03-20 DIAGNOSIS — M25552 Pain in left hip: Secondary | ICD-10-CM | POA: Diagnosis not present

## 2024-03-20 DIAGNOSIS — M1612 Unilateral primary osteoarthritis, left hip: Secondary | ICD-10-CM | POA: Diagnosis not present

## 2024-03-20 DIAGNOSIS — Z6841 Body Mass Index (BMI) 40.0 and over, adult: Secondary | ICD-10-CM | POA: Diagnosis not present

## 2024-03-20 DIAGNOSIS — G8929 Other chronic pain: Secondary | ICD-10-CM | POA: Diagnosis not present

## 2024-03-20 NOTE — Telephone Encounter (Signed)
Duplicate. Please see other encounter.

## 2024-04-01 ENCOUNTER — Ambulatory Visit (INDEPENDENT_AMBULATORY_CARE_PROVIDER_SITE_OTHER): Payer: Self-pay | Admitting: Family Medicine

## 2024-04-01 ENCOUNTER — Encounter: Payer: Self-pay | Admitting: Family Medicine

## 2024-04-01 VITALS — BP 100/70 | HR 70 | Ht 71.0 in | Wt 329.0 lb

## 2024-04-01 DIAGNOSIS — I4819 Other persistent atrial fibrillation: Secondary | ICD-10-CM | POA: Diagnosis not present

## 2024-04-01 DIAGNOSIS — N3281 Overactive bladder: Secondary | ICD-10-CM

## 2024-04-01 DIAGNOSIS — Z6841 Body Mass Index (BMI) 40.0 and over, adult: Secondary | ICD-10-CM | POA: Diagnosis not present

## 2024-04-01 DIAGNOSIS — Z0001 Encounter for general adult medical examination with abnormal findings: Secondary | ICD-10-CM

## 2024-04-01 DIAGNOSIS — Z79899 Other long term (current) drug therapy: Secondary | ICD-10-CM | POA: Diagnosis not present

## 2024-04-01 DIAGNOSIS — E559 Vitamin D deficiency, unspecified: Secondary | ICD-10-CM | POA: Diagnosis not present

## 2024-04-01 DIAGNOSIS — Z0000 Encounter for general adult medical examination without abnormal findings: Secondary | ICD-10-CM

## 2024-04-01 MED ORDER — BUPROPION HCL ER (XL) 150 MG PO TB24: 150.0000 mg | ORAL_TABLET | Freq: Every day | ORAL | 3 refills | Status: DC

## 2024-04-01 MED ORDER — NALTREXONE HCL 50 MG PO TABS: 25.0000 mg | ORAL_TABLET | Freq: Every day | ORAL | 3 refills | Status: AC

## 2024-04-01 NOTE — Progress Notes (Signed)
 Annual Wellness Visit     Patient: Charles Floyd, Male    DOB: 1951-01-13, 73 y.o.   MRN: [id number] Visit Date: 04/01/2024  Today's Provider: Aden Agreste, MD   Chief Complaint  Patient presents with   Annual Exam    Last completed 004/11/2023 Diet -  General diet, well balanced Exercise - none Feeling - poorly Sleeping - fairly well Concerns -  bladder reports urinary frequency   Subjective    Charles Floyd is a 72 y.o. male who presents today for his Annual Wellness Visit.  Discussed the use of AI scribe software for clinical note transcription with the patient, who gave verbal consent to proceed.  History of Present Illness   Charles Floyd "Al Alias" is a 73 year old male who presents for an annual wellness visit.  He experiences frequent urination with significant volume and wears a pad for incontinence. Urine flow is present but lighter in color. He experiences discomfort when getting up to urinate, attributed to pain, but no dribbling or hesitancy. Urgency increases upon standing. He has a history of prostate removal and did not undergo pelvic floor physical therapy post-surgery. A previous urine test showed E. coli.  He experiences hip pain, which hinders weight loss efforts. Aquatic physical therapy has been suggested but not yet scheduled.  He and his wife have reduced portion sizes and are making healthier dietary choices, resulting in some weight loss, attributed to fluid loss from increased urination.  He takes Eliquis  for atrial fibrillation, gabapentin  for chronic pain, metoprolol  for heart rate control, and allopurinol  for gout prevention, though he has not taken allopurinol  recently due to lack of gout issues.  He has fallen off using his CPAP machine, affecting sleep quality, which is further disrupted by frequent nocturnal urination.             Medications: Outpatient Medications Prior to Visit  Medication Sig   acetaminophen   (TYLENOL ) 650 MG CR tablet Take 1,300 mg by mouth every 8 (eight) hours as needed for pain.   apixaban  (ELIQUIS ) 5 MG TABS tablet Take 1 tablet (5 mg total) by mouth 2 (two) times daily.   gabapentin  (NEURONTIN ) 300 MG capsule Take 1 capsule (300 mg total) by mouth 3 (three) times daily.   metoprolol  succinate (TOPROL  XL) 25 MG 24 hr tablet Take 1 tablet (25 mg total) by mouth daily.   allopurinol  (ZYLOPRIM ) 300 MG tablet TAKE 1 TABLET BY MOUTH EVERY DAY (Patient not taking: Reported on 04/01/2024)   No facility-administered medications prior to visit.    No Known Allergies  Patient Care Team: Mazie Speed, MD as PCP - General (Family Medicine) Constancia Delton, MD as PCP - Cardiology (Cardiology) Clair Crews, MD as Referring Physician (Ophthalmology) Bert Britain, PA-C (Orthopedic Surgery) Jolie Neat, MD as Referring Physician (Physical Medicine and Rehabilitation) Cleve Dale, MD as Consulting Physician (Pulmonary Disease) Marquita Situ Magali Schmitz, MD as Consulting Physician (General Surgery)  Review of Systems       Objective    Vitals: BP 100/70 (BP Location: Left Arm, Patient Position: Sitting, Cuff Size: Large)   Pulse 70   Ht 5\' 11"  (1.803 m)   Wt (!) 329 lb (149.2 kg)   SpO2 98%   BMI 45.89 kg/m      Physical Exam Vitals reviewed.  Constitutional:      General: He is not in acute distress.    Appearance: Normal appearance. He is not diaphoretic.  HENT:  Head: Normocephalic and atraumatic.  Eyes:     General: No scleral icterus.    Conjunctiva/sclera: Conjunctivae normal.  Cardiovascular:     Rate and Rhythm: Normal rate and regular rhythm.     Heart sounds: Normal heart sounds. No murmur heard. Pulmonary:     Effort: Pulmonary effort is normal. No respiratory distress.     Breath sounds: Normal breath sounds. No wheezing or rhonchi.  Abdominal:     General: There is no distension.     Palpations: Abdomen is soft.  Musculoskeletal:      Cervical back: Neck supple.     Right lower leg: Edema present.     Left lower leg: Edema present.  Lymphadenopathy:     Cervical: No cervical adenopathy.  Skin:    General: Skin is warm and dry.     Findings: No rash.  Neurological:     Mental Status: He is alert and oriented to person, place, and time. Mental status is at baseline.  Psychiatric:        Mood and Affect: Mood normal.        Behavior: Behavior normal.     Most recent functional status assessment:    04/01/2024   10:41 AM  In your present state of health, do you have any difficulty performing the following activities:  Hearing? 0  Vision? 0  Difficulty concentrating or making decisions? 0  Walking or climbing stairs? 1  Dressing or bathing? 1  Doing errands, shopping? 1  Preparing Food and eating ? N  Using the Toilet? Y  In the past six months, have you accidently leaked urine? Y  Do you have problems with loss of bowel control? N  Managing your Medications? N  Managing your Finances? N  Housekeeping or managing your Housekeeping? N   Most recent fall risk assessment:    04/01/2024   10:43 AM  Fall Risk   Falls in the past year? 0  Number falls in past yr: 0  Injury with Fall? 0  Risk for fall due to : No Fall Risks  Follow up Falls evaluation completed    Most recent depression screenings:    04/01/2024   10:43 AM 02/19/2024   11:05 AM  PHQ 2/9 Scores  PHQ - 2 Score 1 2  PHQ- 9 Score 6 6   Most recent cognitive screening:    04/01/2024   10:44 AM  6CIT Screen  What Year? 0 points  What month? 0 points  What time? 0 points  Count back from 20 0 points  Months in reverse 0 points  Repeat phrase 4 points  Total Score 4 points   Most recent Audit-C alcohol use screening    11/30/2021   11:40 AM  Alcohol Use Disorder Test (AUDIT)  1. How often do you have a drink containing alcohol? 1  2. How many drinks containing alcohol do you have on a typical day when you are drinking? 0  3.  How often do you have six or more drinks on one occasion? 0  AUDIT-C Score 1   A score of 3 or more in women, and 4 or more in men indicates increased risk for alcohol abuse, EXCEPT if all of the points are from question 1   No results found.  No results found for any visits on 04/01/24.  Assessment & Plan     Annual wellness visit done today including the all of the following: Reviewed patient's Family Medical History Reviewed and  updated list of patient's medical providers Assessment of cognitive impairment was done Assessed patient's functional ability Established a written schedule for health screening services Health Risk Assessent Completed and Reviewed  Exercise Activities and Dietary recommendations  Goals      DIET - EAT MORE FRUITS AND VEGETABLES     DIET - INCREASE WATER INTAKE     Recommend to drink at least 6-8 8oz glasses of water per day.     Prevent falls     Recommend to remove any items from the home that may cause slips or trips.        Immunization History  Administered Date(s) Administered   Fluad Trivalent(High Dose 65+) 09/17/2023   Influenza Split 10/14/2012   Influenza, High Dose Seasonal PF 09/19/2017   Influenza-Unspecified 09/22/2020   Pneumococcal Conjugate-13 03/23/2016   Pneumococcal Polysaccharide-23 09/19/2017   Tdap 03/23/2016   Zoster, Live 10/14/2012    Health Maintenance  Topic Date Due   COVID-19 Vaccine (1) Never done   Zoster Vaccines- Shingrix (1 of 2) 02/09/1970   INFLUENZA VACCINE  07/04/2024   Medicare Annual Wellness (AWV)  04/01/2025   Colonoscopy  01/26/2026   DTaP/Tdap/Td (2 - Td or Tdap) 03/23/2026   Pneumonia Vaccine 70+ Years old  Completed   Hepatitis C Screening  Completed   HPV VACCINES  Aged Out   Meningococcal B Vaccine  Aged Out     Discussed health benefits of physical activity, and encouraged him to engage in regular exercise appropriate for his age and condition.    Problem List Items Addressed  This Visit       Cardiovascular and Mediastinum   Persistent atrial fibrillation (HCC) (Chronic)   Relevant Orders   Comprehensive metabolic panel with GFR   Lipid panel   CBC w/Diff/Platelet     Other   Morbid obesity with BMI of 45.0-49.9, adult (HCC) (Chronic)   Relevant Orders   Comprehensive metabolic panel with GFR   Lipid panel   CBC w/Diff/Platelet   VITAMIN D  25 Hydroxy (Vit-D Deficiency, Fractures)   Other Visit Diagnoses       Encounter for annual wellness visit (AWV) in Medicare patient    -  Primary     OAB (overactive bladder)         Avitaminosis D       Relevant Orders   VITAMIN D  25 Hydroxy (Vit-D Deficiency, Fractures)     Long-term use of high-risk medication       Relevant Orders   CBC w/Diff/Platelet           Obesity Obesity requiring weight loss to facilitate hip surgery. Challenges include hip pain and dietary habits. Consideration of appetite suppressant medication. Potential benefits of aquatic physical therapy for weight loss and hip pain management. - Prescribe Wellbutrin 150 mg extended release and naltrexone 25 mg daily for weight loss - Encourage dietary modifications, including reducing junk food and portion control - Discuss potential benefits of aquatic physical therapy for weight loss and hip pain management  Chronic Pain Chronic back pain managed with gabapentin  300 mg TID. Weight impacts pain; weight loss may be beneficial.  Sleep Apnea Sleep apnea with non-compliance to CPAP therapy. Quality sleep is important for weight loss and overall health. - Encourage resumption of CPAP use to improve sleep quality  Urinary Incontinence Urinary incontinence post-prostatectomy with increased frequency and urgency. Potential benefits of pelvic floor exercises and medication for overactive bladder. Consideration of a scheduled voiding regimen. - Encourage pelvic floor  exercises - Consider medication for overactive bladder if symptoms  persist - Implement a scheduled voiding regimen  Prostatectomy Prostatectomy contributing to urinary incontinence. Pelvic floor strength is important post-prostatectomy.  Atrial Fibrillation Atrial fibrillation managed with Eliquis  5 mg BID. No new concerns or changes in management.  Gout Gout previously managed with allopurinol . Currently not taking allopurinol  as there have been no recent flares.  Wellness Visit Annual wellness visit for a 73 year old male. General health maintenance, including screenings and lifestyle modifications, discussed. Labs for cholesterol, kidney and liver function, vitamin D , and blood counts due to Eliquis  use ordered. - Perform annual wellness visit - Order labs for cholesterol, kidney and liver function, vitamin D , and blood counts        Return in about 3 months (around 07/01/2024) for chronic disease f/u.     Aden Agreste, MD  [facility] [phone] (phone) [phone] (fax)  [facility]

## 2024-04-02 ENCOUNTER — Encounter: Payer: Self-pay | Admitting: Family Medicine

## 2024-04-02 LAB — LIPID PANEL
Chol/HDL Ratio: 3.3 ratio (ref 0.0–5.0)
Cholesterol, Total: 144 mg/dL (ref 100–199)
HDL: 44 mg/dL (ref >39)
LDL Chol Calc (NIH): 89 mg/dL (ref 0–99)
Triglycerides: 49 mg/dL (ref 0–149)
VLDL Cholesterol Cal: 11 mg/dL (ref 5–40)

## 2024-04-02 LAB — CBC WITH DIFFERENTIAL/PLATELET
Basophils Absolute: 0.1 10*3/uL (ref 0.0–0.2)
Basos: 1 %
EOS (ABSOLUTE): 0.2 10*3/uL (ref 0.0–0.4)
Eos: 2 %
Hematocrit: 44.8 % (ref 37.5–51.0)
Hemoglobin: 14.7 g/dL (ref 13.0–17.7)
Immature Grans (Abs): 0 10*3/uL (ref 0.0–0.1)
Immature Granulocytes: 0 %
Lymphocytes Absolute: 3.7 10*3/uL — ABNORMAL HIGH (ref 0.7–3.1)
Lymphs: 37 %
MCH: 31.3 pg (ref 26.6–33.0)
MCHC: 32.8 g/dL (ref 31.5–35.7)
MCV: 96 fL (ref 79–97)
Monocytes Absolute: 0.9 10*3/uL (ref 0.1–0.9)
Monocytes: 9 %
Neutrophils Absolute: 5.2 10*3/uL (ref 1.4–7.0)
Neutrophils: 51 %
Platelets: 213 10*3/uL (ref 150–450)
RBC: 4.69 x10E6/uL (ref 4.14–5.80)
RDW: 12.2 % (ref 11.6–15.4)
WBC: 10.2 10*3/uL (ref 3.4–10.8)

## 2024-04-02 LAB — COMPREHENSIVE METABOLIC PANEL WITH GFR
ALT: 15 IU/L (ref 0–44)
AST: 20 IU/L (ref 0–40)
Albumin: 3.9 g/dL (ref 3.8–4.8)
Alkaline Phosphatase: 61 IU/L (ref 44–121)
BUN/Creatinine Ratio: 22 (ref 10–24)
BUN: 21 mg/dL (ref 8–27)
Bilirubin Total: 0.9 mg/dL (ref 0.0–1.2)
CO2: 26 mmol/L (ref 20–29)
Calcium: 9.6 mg/dL (ref 8.6–10.2)
Chloride: 100 mmol/L (ref 96–106)
Creatinine, Ser: 0.94 mg/dL (ref 0.76–1.27)
Globulin, Total: 3.3 g/dL (ref 1.5–4.5)
Glucose: 94 mg/dL (ref 70–99)
Potassium: 4.8 mmol/L (ref 3.5–5.2)
Sodium: 142 mmol/L (ref 134–144)
Total Protein: 7.2 g/dL (ref 6.0–8.5)
eGFR: 86 mL/min/{1.73_m2} (ref >59)

## 2024-04-02 LAB — VITAMIN D 25 HYDROXY (VIT D DEFICIENCY, FRACTURES): Vit D, 25-Hydroxy: 37.9 ng/mL (ref 30.0–100.0)

## 2024-04-03 ENCOUNTER — Encounter
Admission: RE | Admit: 2024-04-03 | Discharge: 2024-04-03 | Disposition: A | Discharge status: Home / Self Care | Source: Ambulatory Visit | Attending: Surgery | Admitting: Surgery

## 2024-04-03 ENCOUNTER — Encounter: Payer: Self-pay | Admitting: Surgery

## 2024-04-03 ENCOUNTER — Other Ambulatory Visit: Payer: Self-pay

## 2024-04-03 DIAGNOSIS — I482 Chronic atrial fibrillation, unspecified: Secondary | ICD-10-CM

## 2024-04-03 HISTORY — DX: Dyspnea, unspecified: R06.00

## 2024-04-03 NOTE — Patient Instructions (Addendum)
 Your procedure is scheduled on: 04/11/24 - Friday Report to the Registration Desk on the 1st floor of the [facility]. To find out your arrival time, please call [phone] between 1PM - 3PM on: 04/10/24 - Thursday If your arrival time is 6:00 am, do not arrive before that time as the [facility] entrance doors do not open until 6:00 am.  REMEMBER: Instructions that are not followed completely may result in serious medical risk, up to and including death; or upon the discretion of your surgeon and anesthesiologist your surgery may need to be rescheduled.  Do not eat food after midnight the night before surgery.  No gum chewing or hard candies.  You may however, drink CLEAR liquids up to 2 hours before you are scheduled to arrive for your surgery. Do not drink anything within 2 hours of your scheduled arrival time.  Clear liquids include: - water  - apple juice without pulp - gatorade (not RED colors) - black coffee or tea (Do NOT add milk or creamers to the coffee or tea) Do NOT drink anything that is not on this list.   One week prior to surgery: Stop Anti-inflammatories (NSAIDS) such as Advil, Aleve, Ibuprofen, Motrin, Naproxen, Naprosyn and Aspirin based products such as Excedrin, Goody's Powder, BC Powder. You may take Tylenol  if needed for pain up until the day of surgery.  Stop ANY OVER THE COUNTER supplements until after surgery.  HOLD ELIQUIS  beginning 04/08/24, may resume with doctors order.  HOLD naltrexone  (DEPADE)  beginning 04/08/24, may resume after surgery.   ON THE DAY OF SURGERY ONLY TAKE THESE MEDICATIONS WITH SIPS OF WATER:  metoprolol  succinate  gabapentin  (NEURONTIN )  buPROPion  (WELLBUTRIN  XL)    No Alcohol for 24 hours before or after surgery.  No Smoking including e-cigarettes for 24 hours before surgery.  No chewable tobacco products for at least 6 hours before surgery.  No nicotine patches on the day of surgery.  Do not use any "recreational"  drugs for at least a week (preferably 2 weeks) before your surgery.  Please be advised that the combination of cocaine and anesthesia may have negative outcomes, up to and including death. If you test positive for cocaine, your surgery will be cancelled.  On the morning of surgery brush your teeth with toothpaste and water, you may rinse your mouth with mouthwash if you wish. Do not swallow any toothpaste or mouthwash.  Use CHG Soap or wipes as directed on instruction sheet.  Do not wear jewelry, make-up, hairpins, clips or nail polish.  For welded (permanent) jewelry: bracelets, anklets, waist bands, etc.  Please have this removed prior to surgery.  If it is not removed, there is a chance that hospital personnel will need to cut it off on the day of surgery.  Do not wear lotions, powders, or perfumes.   Do not shave body hair from the neck down 48 hours before surgery.  Contact lenses, hearing aids and dentures may not be worn into surgery.  Do not bring valuables to the hospital. [facility] is not responsible for any missing/lost belongings or valuables.   Bring your C-PAP to the hospital in case you may have to spend the night.   Notify your doctor if there is any change in your medical condition (cold, fever, infection).  Wear comfortable clothing (specific to your surgery type) to the hospital.  After surgery, you can help prevent lung complications by doing breathing exercises.  Take deep breaths and cough every 1-2  hours. Your doctor may order a device called an Incentive Spirometer to help you take deep breaths.  When coughing or sneezing, hold a pillow firmly against your incision with both hands. This is called "splinting." Doing this helps protect your incision. It also decreases belly discomfort.  If you are being admitted to the hospital overnight, leave your suitcase in the car. After surgery it may be brought to your room.  In case of increased patient census, it may  be necessary for you, the patient, to continue your postoperative care in the Same Day Surgery department.  If you are being discharged the day of surgery, you will not be allowed to drive home. You will need a responsible individual to drive you home and stay with you for 24 hours after surgery.   If you are taking public transportation, you will need to have a responsible individual with you.  Please call the Pre-admissions Testing Dept. at [phone] if you have any questions about these instructions.  Surgery Visitation Policy:  Patients having surgery or a procedure may have two visitors.  Children under the age of 65 must have an adult with them who is not the patient.  Inpatient Visitation:    Visiting hours are 7 a.m. to 8 p.m. Up to four visitors are allowed at one time in a patient room. The visitors may rotate out with other people during the day.  One visitor age 38 or older may stay with the patient overnight and must be in the room by 8 p.m.     Preparing for Surgery with CHLORHEXIDINE GLUCONATE (CHG) Soap  Chlorhexidine Gluconate (CHG) Soap  o An antiseptic cleaner that kills germs and bonds with the skin to continue killing germs even after washing  o Used for showering the night before surgery and morning of surgery  Before surgery, you can play an important role by reducing the number of germs on your skin.  CHG (Chlorhexidine gluconate) soap is an antiseptic cleanser which kills germs and bonds with the skin to continue killing germs even after washing.  Please do not use if you have an allergy to CHG or antibacterial soaps. If your skin becomes reddened/irritated stop using the CHG.  1. Shower the NIGHT BEFORE SURGERY and the MORNING OF SURGERY with CHG soap.  2. If you choose to wash your hair, wash your hair first as usual with your normal shampoo.  3. After shampooing, rinse your hair and body thoroughly to remove the shampoo.  4. Use CHG as you  would any other liquid soap. You can apply CHG directly to the skin and wash gently with a scrungie or a clean washcloth.  5. Apply the CHG soap to your body only from the neck down. Do not use on open wounds or open sores. Avoid contact with your eyes, ears, mouth, and genitals (private parts). Wash face and genitals (private parts) with your normal soap.  6. Wash thoroughly, paying special attention to the area where your surgery will be performed.  7. Thoroughly rinse your body with warm water.  8. Do not shower/wash with your normal soap after using and rinsing off the CHG soap.  9. Pat yourself dry with a clean towel.  10. Wear clean pajamas to bed the night before surgery.  12. Place clean sheets on your bed the night of your first shower and do not sleep with pets.  13. Shower again with the CHG soap on the day of surgery prior to  arriving at the hospital.  14. Do not apply any deodorants/lotions/powders.  15. Please wear clean clothes to the hospital.

## 2024-04-07 ENCOUNTER — Inpatient Hospital Stay: Admission: RE | Admit: 2024-04-07 | Source: Ambulatory Visit

## 2024-04-08 ENCOUNTER — Encounter
Admission: RE | Admit: 2024-04-08 | Discharge: 2024-04-08 | Disposition: A | Discharge status: Home / Self Care | Source: Ambulatory Visit | Attending: Surgery | Admitting: Surgery

## 2024-04-08 DIAGNOSIS — I482 Chronic atrial fibrillation, unspecified: Secondary | ICD-10-CM | POA: Diagnosis not present

## 2024-04-08 DIAGNOSIS — Z01810 Encounter for preprocedural cardiovascular examination: Secondary | ICD-10-CM | POA: Diagnosis not present

## 2024-04-09 ENCOUNTER — Encounter: Payer: Self-pay | Admitting: Surgery

## 2024-04-09 NOTE — Progress Notes (Signed)
 Perioperative / Anesthesia Services  Pre-Admission Testing Clinical Review / Pre-Operative Anesthesia Consult  Date: 04/09/24  Patient Demographics:  Name: Charles Floyd DOB: [birth year] MRN:   [id number]  Planned Surgical Procedure(s):    Case: [id number] Date/Time: 04/11/24 1243   Procedure: EXCISION, LESION, LOWER EXTREMITY (Right: Leg Lower)   Anesthesia type: General   Pre-op diagnosis: L98.9  Skin lesion leg right   Location: [facility] OR ROOM 04 / [facility] ORS FOR ANEST[facility]ESIA [facility]   Surgeons: Charles Delay, DO      NOTE: Available PAT nursing documentation and vital signs have been reviewed. Clinical nursing staff has updated patient's PMH/PSHx, current medication list, and drug allergies/intolerances to ensure comprehensive history available to assist in medical decision making as it pertains to the aforementioned surgical procedure and anticipated anesthetic course. Extensive review of available clinical information personally performed. [place] PMH and PSHx updated with any diagnoses/procedures that  may have been inadvertently omitted during his intake with the pre-admission testing department's nursing staff.  Clinical Discussion:  Charles Floyd is a 73 y.o. male who is submitted for pre-surgical anesthesia review and clearance prior to him undergoing the above procedure. Patient has never been a smoker in the past. Pertinent PMH includes: atrial fibrillation, RBBB, dyspnea, OSAH (requires nocturnal PAP therapy), nephrolithiasis, OA, lumbar stenosis, panic attacks.  Patient is followed by cardiology Charles Olives, MD). He was last seen in the cardiology clinic on ***; notes reviewed. ***At the time of his clinic visit, patient doing well overall from a cardiovascular perspective. Patient denied any chest pain, shortness of breath, PND, orthopnea, palpitations, significant peripheral edema, weakness, fatigue, vertiginous symptoms, or presyncope/syncope. Patient with a past medical  history significant for cardiovascular diagnoses. Documented physical exam was grossly benign, providing no evidence of acute exacerbation and/or decompensation of the patient's known cardiovascular conditions.  ***  Blood pressure***controlled at *** mmHg on currently prescribed *** therapies.  Patient is on *** for his HLD diagnosis and ASCVD prevention. ***Patient is not diabetic. ***He does not have an OSAH diagnosis. ***FC. No changes were made to his medication regimen during his visit with cardiology.  Patient scheduled to follow-up with outpatient cardiology in***months or sooner if needed.  Charles Floyd is scheduled for an elective EXCISION, LESION, LOWER EXTREMITY (Right: Leg Lower) on 04/11/2028 with Dr. Conrado Delay, DO.  Given patient's past medical history significant for cardiovascular diagnoses, presurgical cardiac clearance was sought by the PAT team.  Per cardiology, "patient's RCRI score is 0.9%. The patient affirms he has been doing well without any new cardiac symptoms. They are able to achieve 4 METS without cardiac limitations. Therefore, based on ACC/AHA guidelines, the patient would be at ACCEPTABLE risk for the planned procedure without further cardiovascular testing".    Again, this patient is on daily oral anticoagulation therapy using a DOAC medication. He has been instructed on recommendations for holding his apixaban  for 3 days prior to his procedure with plans to restart as soon as postoperative bleeding risk felt to be minimized by his attending surgeon. The patient has been instructed that his last dose of apixaban  should be on 04/07/2024.  Patient denies previous perioperative complications with anesthesia in the past. In review his EMR, it is noted that patient underwent a general anesthetic course here at [facility] ([facility]) in 07/2021 without documented complications.      04/01/2024   10:40 AM 02/19/2024   10:55 AM 10/18/2023     2:17 PM  Vitals with  BMI  Height 5\' 11"  5\' 11"  5\' 11"   Weight 329 lbs 337 lbs 345 lbs  BMI 45.91 47.02 48.14  Systolic 100 103 098  Diastolic 70 72 72  Pulse 70 74 69   Providers/Specialists:  NOTE: Primary physician provider listed below. Patient may have been seen by APP or partner within same practice.   PROVIDER ROLE / SPECIALTY LAST Charles Sees, DO General Surgery (Surgeon) 03/05/2024  Charles Speed, MD Primary Care Provider 04/01/2024  Constancia Delton, MD Cardiology 03/29/2023; preop APP call 03/19/2024  Cyndia Drape, MD Pain Management 10/18/2023   Allergies:  No Known Allergies Current Home Medications:   No current facility-administered medications for this encounter.    acetaminophen  (TYLENOL ) 650 MG CR tablet   apixaban  (ELIQUIS ) 5 MG TABS tablet   bisacodyl  (DULCOLAX) 5 MG EC tablet   cholecalciferol (VITAMIN D3) 25 MCG (1000 UNIT) tablet   gabapentin  (NEURONTIN ) 300 MG capsule   metoprolol  succinate (TOPROL  XL) 25 MG 24 hr tablet   buPROPion  (WELLBUTRIN  XL) 150 MG 24 hr tablet   naltrexone  (DEPADE) 50 MG tablet   History:   Past Medical History:  Diagnosis Date   Arthritis of back    Atrial fibrillation (HCC) 08/2019   a.) CHA2DS2VASc = 1 (age) as of 04/09/2024; b.) s/p DCCV 07/13/2021 --> 200J x 1 --> NSR with ERAF within 2 days; c.) rate/rhythm maintained on oral metoprolol  succinate; chronically anticoagulated with apixaban    Cancer of prostate (HCC) 2012   a.) s/p robotic assisted prostatectomy in 2012 at [facility]   Childhood asthma    Dyspnea    Gout    Gout    History of chicken pox    History of kidney stones    History of measles    History of mumps    On apixaban  therapy    OSA on CPAP    a.) only utilizes nocturnal PAP therapy "occassionally"   Panic attacks    Past Surgical History:  Procedure Laterality Date   CARDIOVERSION N/A 07/13/2021   Procedure: CARDIOVERSION;  Surgeon: Constancia Delton, MD;  Location: [facility]  ORS;  Service: Cardiovascular;  Laterality: N/A;   CATARACT EXTRACTION Bilateral 04/2021   CHOLECYSTECTOMY N/A 09/18/2017   Procedure: LAPAROSCOPIC CHOLECYSTECTOMY WITH INTRAOPERATIVE CHOLANGIOGRAM;  Surgeon: Marshall Skeeter, MD;  Location: [facility] ORS;  Service: General;  Laterality: N/A;   COLONOSCOPY  Jan 2003, 12-17-13   Dr Marquita Situ   COLONOSCOPY WITH PROPOFOL  N/A 01/26/2021   Procedure: COLONOSCOPY WITH PROPOFOL ;  Surgeon: Marshall Skeeter, MD;  Location: [facility] ENDOSCOPY;  Service: Endoscopy;  Laterality: N/A;  COVID POSITIVE 01/03/2021   KIDNEY STONE REMOVAL AND URETERAL STENTING  07/2013   PROSTATECTOMY  05/16/2011   [facility]. Abdominal Laparoscopic, robotic assisted   Family History  Problem Relation Age of Onset   Cancer Mother        colon and breast   CAD Mother    Anemia Mother    Colon cancer Mother    Congestive Heart Failure Father    Tuberculosis Father    CAD Father    Emphysema Father    Social History   Tobacco Use   Smoking status: Never   Smokeless tobacco: Never  Substance Use Topics   Alcohol use: Yes    Alcohol/week: 0.0 standard drinks of alcohol    Comment: occasional use 1 drink every couple weeks (wine or beer)   Pertinent Clinical Results:  LABS:  Office Visit on 04/01/2024  Component Date Value  Ref Range Status   Glucose 04/01/2024 94  70 - 99 mg/dL Final   BUN 40/98/1191 21  8 - 27 mg/dL Final   Creatinine, Ser 04/01/2024 0.94  0.76 - 1.27 mg/dL Final   eGFR 47/82/9562 86  >59 mL/min/1.73 Final   BUN/Creatinine Ratio 04/01/2024 22  10 - 24 Final   Sodium 04/01/2024 142  134 - 144 mmol/L Final   Potassium 04/01/2024 4.8  3.5 - 5.2 mmol/L Final   Chloride 04/01/2024 100  96 - 106 mmol/L Final   CO2 04/01/2024 26  20 - 29 mmol/L Final   Calcium 04/01/2024 9.6  8.6 - 10.2 mg/dL Final   Total Protein 13/07/6577 7.2  6.0 - 8.5 g/dL Final   Albumin 46/96/2952 3.9  3.8 - 4.8 g/dL Final   Globulin, Total 04/01/2024 3.3  1.5 - 4.5 g/dL Final    Bilirubin Total 04/01/2024 0.9  0.0 - 1.2 mg/dL Final   Alkaline Phosphatase 04/01/2024 61  44 - 121 IU/L Final   AST 04/01/2024 20  0 - 40 IU/L Final   ALT 04/01/2024 15  0 - 44 IU/L Final   Cholesterol, Total 04/01/2024 144  100 - 199 mg/dL Final   Triglycerides 84/13/2440 49  0 - 149 mg/dL Final   HDL 09/30/2535 44  >39 mg/dL Final   VLDL Cholesterol Cal 04/01/2024 11  5 - 40 mg/dL Final   LDL Chol Calc (NIH) 04/01/2024 89  0 - 99 mg/dL Final   Chol/HDL Ratio 04/01/2024 3.3  0.0 - 5.0 ratio Final   Comment:                                   T. Chol/HDL Ratio                                             Men  Women                               1/2 Avg.Risk  3.4    3.3                                   Avg.Risk  5.0    4.4                                2X Avg.Risk  9.6    7.1                                3X Avg.Risk 23.4   11.0    WBC 04/01/2024 10.2  3.4 - 10.8 x10E3/uL Final   RBC 04/01/2024 4.69  4.14 - 5.80 x10E6/uL Final   Hemoglobin 04/01/2024 14.7  13.0 - 17.7 g/dL Final   Hematocrit 64/40/3474 44.8  37.5 - 51.0 % Final   MCV 04/01/2024 96  79 - 97 fL Final   MCH 04/01/2024 31.3  26.6 - 33.0 pg Final   MCHC 04/01/2024 32.8  31.5 - 35.7 g/dL Final   RDW 25/95/6387 12.2  11.6 - 15.4 % Final   Platelets 04/01/2024  213  150 - 450 x10E3/uL Final   Neutrophils 04/01/2024 51  Not Estab. % Final   Lymphs 04/01/2024 37  Not Estab. % Final   Monocytes 04/01/2024 9  Not Estab. % Final   Eos 04/01/2024 2  Not Estab. % Final   Basos 04/01/2024 1  Not Estab. % Final   Neutrophils Absolute 04/01/2024 5.2  1.4 - 7.0 x10E3/uL Final   Lymphocytes Absolute 04/01/2024 3.7 (H)  0.7 - 3.1 x10E3/uL Final   Monocytes Absolute 04/01/2024 0.9  0.1 - 0.9 x10E3/uL Final   EOS (ABSOLUTE) 04/01/2024 0.2  0.0 - 0.4 x10E3/uL Final   Basophils Absolute 04/01/2024 0.1  0.0 - 0.2 x10E3/uL Final   Immature Granulocytes 04/01/2024 0  Not Estab. % Final   Immature Grans (Abs) 04/01/2024 0.0  0.0 - 0.1  x10E3/uL Final   Vit D, 25-Hydroxy 04/01/2024 37.9  30.0 - 100.0 ng/mL Final   Comment: Vitamin D  deficiency has been defined by the Institute of Medicine and an Endocrine Society practice guideline as a level of serum 25-OH vitamin D  less than 20 ng/mL (1,2). The Endocrine Society went on to further define vitamin D  insufficiency as a level between 21 and 29 ng/mL (2). 1. IOM (Institute of Medicine). 2010. Dietary reference    intakes for calcium and D. [place]: The    [employer]. 2. Holick MF, Binkley , Bischoff-Ferrari HA, et al.    Evaluation, treatment, and prevention of vitamin D     deficiency: an Endocrine Society clinical practice    guideline. JCEM. 2011 Jul; 96(7):1911-30.     ECG: Date: 04/08/2024  Time ECG obtained: 1454 PM Rate: 79 bpm Rhythm:  Atrial fibrillation; RBBB Axis (leads I and aVF): normal Intervals: QRS 124 ms. QTc 442 ms. ST segment and T wave changes: No evidence of acute T wave abnormalities or significant ST segment elevation or depression.  Evidence of a possible, age undetermined, prior infarct:  No Comparison: Similar to previous tracing obtained on 03/29/2023   IMAGING / PROCEDURES: DIAGNOSTIC RADIOGRAPHS OF LEFT HIP 2-3 VIEWS performed on 03/20/2024 Severe osteoarthritic changes of the left femoral acetabular joint with complete invasion of the femoral head into the superior margin of the acetabulum.   Approximately 30% loss of femoral head noted.   No signs of AVN present.   Subchondral changes present, osteophyte  formation is present.   No fractures, lytic lesions or gross deformities appreciated on films.   MR HIP LEFT WO CONTRAST performed on 11/05/2023 No fracture, stress change or worrisome lesion.  No avascular necrosis of the femoral heads.  Extensive subchondral edema is present about the left hip. O Osteophytosis about the left femoral head also noted.  TRANSTHORACIC ECHOCARDIOGRAM performed on  09/17/2019 Left ventricular ejection fraction, by visual estimation, is 55 to 60%. The left ventricle has normal function. Normal left ventricular size. There is borderline left ventricular hypertrophy.  Left ventricular diastolic function could not be evaluated pattern of LV diastolic filling. Global right ventricle has normal systolic function.The right ventricular size is normal. Right vetricular wall thickness was not assessed.  Left atrial size was normal.  Right atrial size was normal.  The mitral valve is normal in structure. Trace mitral valve regurgitation.  The tricuspid valve is normal in structure. Tricuspid valve regurgitation is mild.  The aortic valve is normal in structure. Aortic valve regurgitation was not visualized by color flow Doppler.  The pulmonic valve was not well visualized. Pulmonic valve regurgitation is not visualized by  color flow Doppler.  Mildly elevated pulmonary artery systolic pressure (37.3 mmHg)  Impression and Plan:  ROLDAN COXEY has been referred for pre-anesthesia review and clearance prior to him undergoing the planned anesthetic and procedural courses. Available labs, pertinent testing, and imaging results were personally reviewed by me in preparation for upcoming operative/procedural course. [facility] medical record has been updated following extensive record review and patient interview with PAT staff.   ATTENTION --> PENDING CLEARANCE AT THIS TIME -- NOTE/CONTENTS NOT FINAL UNTIL SIGNED This patient has been appropriately cleared by cardiology with an overall *** risk of patient experiencing significant perioperative cardiovascular complications. Based on clinical review performed today (04/09/24), barring any significant acute changes in the patient's overall condition, it is anticipated that he will be able to proceed with the planned surgical intervention. Any acute changes in clinical condition may necessitate his procedure being postponed  and/or cancelled. Patient will meet with anesthesia team (MD and/or CRNA) on the day of his procedure for preoperative evaluation/assessment. Questions regarding anesthetic course will be fielded at that time.   Pre-surgical instructions were reviewed with the patient during his PAT appointment, and questions were fielded to satisfaction by PAT clinical staff. He has been instructed on which medications that he will need to hold prior to surgery, as well as the ones that have been deemed safe/appropriate to take on the day of his procedure. As part of the general education provided by PAT, patient made aware both verbally and in writing, that he would need to abstain from the use of any illegal substances during his perioperative course. He was advised that failure to follow the provided instructions could necessitate case cancellation or result in serious perioperative complications up to and including death. Patient encouraged to contact PAT and/or his surgeon's office to discuss any questions or concerns that may arise prior to surgery; verbalized understanding.   Renate Caroline, MSN, APRN, FNP-C, CEN [facility]  Perioperative Services Nurse Practitioner Phone: [phone] Fax: [phone] 04/09/24 11:05 AM  NOTE: This note has been prepared using Dragon dictation software. Despite my best ability to proofread, there is always the potential that unintentional transcriptional errors may still occur from this process.

## 2024-04-10 ENCOUNTER — Encounter: Payer: Self-pay | Admitting: Surgery

## 2024-04-10 MED ORDER — CEFAZOLIN SODIUM-DEXTROSE 2-4 GM/100ML-% IV SOLN: 2.0000 g | INTRAVENOUS | Status: DC

## 2024-04-10 MED ORDER — ORAL CARE MOUTH RINSE: 15.0000 mL | Freq: Once | OROMUCOSAL | Status: AC

## 2024-04-10 MED ORDER — LACTATED RINGERS IV SOLN: INTRAVENOUS | Status: DC

## 2024-04-10 MED ORDER — CHLORHEXIDINE GLUCONATE 0.12 % MT SOLN
15.0000 mL | Freq: Once | OROMUCOSAL | Status: AC
  Administered 2024-04-11: 15 mL via OROMUCOSAL

## 2024-04-10 MED ORDER — CHLORHEXIDINE GLUCONATE CLOTH 2 % EX PADS
6.0000 | MEDICATED_PAD | Freq: Once | CUTANEOUS | Status: AC
  Administered 2024-04-11: 6 via TOPICAL

## 2024-04-11 ENCOUNTER — Ambulatory Visit
Admission: RE | Admit: 2024-04-11 | Discharge: 2024-04-11 | Disposition: A | Discharge status: Home / Self Care | Attending: Surgery | Admitting: Surgery

## 2024-04-11 ENCOUNTER — Encounter
Admission: RE | Disposition: A | Discharge status: Home / Self Care | Payer: Self-pay | Source: Home / Self Care | Attending: Surgery

## 2024-04-11 ENCOUNTER — Ambulatory Visit: Payer: Self-pay | Admitting: Urgent Care

## 2024-04-11 ENCOUNTER — Other Ambulatory Visit: Payer: Self-pay

## 2024-04-11 ENCOUNTER — Encounter: Payer: Self-pay | Admitting: Surgery

## 2024-04-11 DIAGNOSIS — R001 Bradycardia, unspecified: Secondary | ICD-10-CM | POA: Diagnosis not present

## 2024-04-11 DIAGNOSIS — I4891 Unspecified atrial fibrillation: Secondary | ICD-10-CM | POA: Insufficient documentation

## 2024-04-11 DIAGNOSIS — Z91199 Patient's noncompliance with other medical treatment and regimen due to unspecified reason: Secondary | ICD-10-CM | POA: Diagnosis not present

## 2024-04-11 DIAGNOSIS — M1990 Unspecified osteoarthritis, unspecified site: Secondary | ICD-10-CM | POA: Insufficient documentation

## 2024-04-11 DIAGNOSIS — I482 Chronic atrial fibrillation, unspecified: Secondary | ICD-10-CM | POA: Diagnosis not present

## 2024-04-11 DIAGNOSIS — C44722 Squamous cell carcinoma of skin of right lower limb, including hip: Secondary | ICD-10-CM | POA: Insufficient documentation

## 2024-04-11 DIAGNOSIS — Z6841 Body Mass Index (BMI) 40.0 and over, adult: Secondary | ICD-10-CM | POA: Insufficient documentation

## 2024-04-11 DIAGNOSIS — E66813 Obesity, class 3: Secondary | ICD-10-CM | POA: Diagnosis not present

## 2024-04-11 DIAGNOSIS — I4510 Unspecified right bundle-branch block: Secondary | ICD-10-CM | POA: Insufficient documentation

## 2024-04-11 DIAGNOSIS — G4733 Obstructive sleep apnea (adult) (pediatric): Secondary | ICD-10-CM | POA: Diagnosis not present

## 2024-04-11 DIAGNOSIS — L989 Disorder of the skin and subcutaneous tissue, unspecified: Secondary | ICD-10-CM | POA: Diagnosis not present

## 2024-04-11 DIAGNOSIS — Z7901 Long term (current) use of anticoagulants: Secondary | ICD-10-CM | POA: Diagnosis not present

## 2024-04-11 DIAGNOSIS — Z79899 Other long term (current) drug therapy: Secondary | ICD-10-CM | POA: Insufficient documentation

## 2024-04-11 DIAGNOSIS — I10 Essential (primary) hypertension: Secondary | ICD-10-CM | POA: Insufficient documentation

## 2024-04-11 HISTORY — DX: Vitamin D deficiency, unspecified: E55.9

## 2024-04-11 HISTORY — DX: Essential (primary) hypertension: I10

## 2024-04-11 HISTORY — DX: Obstructive sleep apnea (adult) (pediatric): G47.33

## 2024-04-11 HISTORY — DX: Spondylosis, unspecified: M47.9

## 2024-04-11 HISTORY — DX: Unspecified right bundle-branch block: I45.10

## 2024-04-11 HISTORY — DX: Spinal stenosis, lumbar region without neurogenic claudication: M48.061

## 2024-04-11 HISTORY — DX: Long term (current) use of anticoagulants: Z79.01

## 2024-04-11 HISTORY — DX: Overactive bladder: N32.81

## 2024-04-11 HISTORY — DX: Unspecified asthma, uncomplicated: J45.909

## 2024-04-11 HISTORY — PX: LESION REMOVAL: SHX5196

## 2024-04-11 HISTORY — DX: Melanoma in situ, unspecified: D03.9

## 2024-04-11 HISTORY — DX: Bradycardia, unspecified: R00.1

## 2024-04-11 SURGERY — EXCISION, LESION, LOWER EXTREMITY
Anesthesia: Choice | Laterality: Right

## 2024-04-11 SURGERY — EXCISION, LESION, LOWER EXTREMITY
Anesthesia: Monitor Anesthesia Care | Site: Leg Lower | Laterality: Right

## 2024-04-11 MED ORDER — PROPOFOL 500 MG/50ML IV EMUL
INTRAVENOUS | Status: DC | PRN
  Administered 2024-04-11: 30 ug/kg/min via INTRAVENOUS

## 2024-04-11 MED ORDER — METOPROLOL TARTRATE 5 MG/5ML IV SOLN
INTRAVENOUS | Status: DC | PRN
  Administered 2024-04-11 (×2): 2.5 mg via INTRAVENOUS

## 2024-04-11 MED ORDER — LIDOCAINE HCL (PF) 2 % IJ SOLN
INTRAMUSCULAR | Status: DC | PRN
  Administered 2024-04-11: 100 mg via INTRADERMAL

## 2024-04-11 MED ORDER — LIDOCAINE-EPINEPHRINE 1 %-1:100000 IJ SOLN
INTRAMUSCULAR | Status: DC | PRN
  Administered 2024-04-11: 30 mL

## 2024-04-11 MED ORDER — GLYCOPYRROLATE 0.2 MG/ML IJ SOLN
INTRAMUSCULAR | Status: DC | PRN
  Administered 2024-04-11: .2 mg via INTRAVENOUS

## 2024-04-11 MED ORDER — ESMOLOL HCL 100 MG/10ML IV SOLN
INTRAVENOUS | Status: DC | PRN
  Administered 2024-04-11 (×2): 30 mg via INTRAVENOUS
  Administered 2024-04-11: 20 mg via INTRAVENOUS

## 2024-04-11 MED ORDER — ACETAMINOPHEN 10 MG/ML IV SOLN: 1000.0000 mg | Freq: Once | INTRAVENOUS | Status: DC | PRN

## 2024-04-11 MED ORDER — OXYCODONE HCL 5 MG/5ML PO SOLN: 5.0000 mg | Freq: Once | ORAL | Status: DC | PRN

## 2024-04-11 MED ORDER — OXYCODONE HCL 5 MG PO TABS: 5.0000 mg | ORAL_TABLET | Freq: Once | ORAL | Status: DC | PRN

## 2024-04-11 MED ORDER — CEFAZOLIN SODIUM-DEXTROSE 3-4 GM/150ML-% IV SOLN
3.0000 g | Freq: Once | INTRAVENOUS | Status: AC
  Administered 2024-04-11: 3 g via INTRAVENOUS
  Filled 2024-04-11: qty 150

## 2024-04-11 MED ORDER — ONDANSETRON HCL 4 MG/2ML IJ SOLN
INTRAMUSCULAR | Status: DC | PRN
  Administered 2024-04-11: 4 mg via INTRAVENOUS

## 2024-04-11 MED ORDER — CEFAZOLIN SODIUM-DEXTROSE 2-4 GM/100ML-% IV SOLN
INTRAVENOUS | Status: AC
  Filled 2024-04-11: qty 100

## 2024-04-11 MED ORDER — BUPIVACAINE HCL (PF) 0.5 % IJ SOLN
INTRAMUSCULAR | Status: AC
  Filled 2024-04-11: qty 30

## 2024-04-11 MED ORDER — CHLORHEXIDINE GLUCONATE 0.12 % MT SOLN
OROMUCOSAL | Status: AC
  Filled 2024-04-11: qty 15

## 2024-04-11 MED ORDER — PROPOFOL 1000 MG/100ML IV EMUL
INTRAVENOUS | Status: AC
  Filled 2024-04-11: qty 100

## 2024-04-11 MED ORDER — LIDOCAINE-EPINEPHRINE 1 %-1:100000 IJ SOLN
INTRAMUSCULAR | Status: AC
  Filled 2024-04-11: qty 1

## 2024-04-11 MED ORDER — DROPERIDOL 2.5 MG/ML IJ SOLN: 0.6250 mg | Freq: Once | INTRAMUSCULAR | Status: DC | PRN

## 2024-04-11 MED ORDER — MIDAZOLAM HCL 2 MG/2ML IJ SOLN
INTRAMUSCULAR | Status: AC
  Filled 2024-04-11: qty 2

## 2024-04-11 MED ORDER — DOCUSATE SODIUM 100 MG PO CAPS: 100.0000 mg | ORAL_CAPSULE | Freq: Two times a day (BID) | ORAL | 0 refills | Status: AC | PRN

## 2024-04-11 MED ORDER — KETAMINE HCL 50 MG/5ML IJ SOSY
PREFILLED_SYRINGE | INTRAMUSCULAR | Status: DC | PRN
  Administered 2024-04-11 (×2): 20 mg via INTRAVENOUS
  Administered 2024-04-11: 10 mg via INTRAVENOUS

## 2024-04-11 MED ORDER — KETAMINE HCL 50 MG/5ML IJ SOSY
PREFILLED_SYRINGE | INTRAMUSCULAR | Status: AC
  Filled 2024-04-11: qty 5

## 2024-04-11 MED ORDER — TRAMADOL HCL 50 MG PO TABS: 50.0000 mg | ORAL_TABLET | Freq: Four times a day (QID) | ORAL | 0 refills | Status: AC | PRN

## 2024-04-11 MED ORDER — PHENYLEPHRINE 80 MCG/ML (10ML) SYRINGE FOR IV PUSH (FOR BLOOD PRESSURE SUPPORT)
PREFILLED_SYRINGE | INTRAVENOUS | Status: DC | PRN
  Administered 2024-04-11: 80 ug via INTRAVENOUS
  Administered 2024-04-11: 240 ug via INTRAVENOUS
  Administered 2024-04-11: 160 ug via INTRAVENOUS

## 2024-04-11 MED ORDER — 0.9 % SODIUM CHLORIDE (POUR BTL) OPTIME
TOPICAL | Status: DC | PRN
  Administered 2024-04-11: 500 mL

## 2024-04-11 MED ORDER — FENTANYL CITRATE (PF) 100 MCG/2ML IJ SOLN
25.0000 ug | INTRAMUSCULAR | Status: DC | PRN
Start: 2024-04-11 — End: 2024-04-11

## 2024-04-11 MED ORDER — MIDAZOLAM HCL 2 MG/2ML IJ SOLN
INTRAMUSCULAR | Status: DC | PRN
  Administered 2024-04-11: 2 mg via INTRAVENOUS

## 2024-04-11 SURGICAL SUPPLY — 26 items
BLADE SURG 15 STRL LF DISP TIS (BLADE) ×1 IMPLANT
DERMABOND ADVANCED .7 DNX12 (GAUZE/BANDAGES/DRESSINGS) ×1 IMPLANT
DRAPE LAPAROTOMY 100X77 ABD (DRAPES) ×1 IMPLANT
DRAPE SHEET LG 3/4 BI-LAMINATE (DRAPES) ×1 IMPLANT
ELECTRODE REM PT RTRN 9FT ADLT (ELECTROSURGICAL) ×1 IMPLANT
GAUZE 4X4 16PLY ~~LOC~~+RFID DBL (SPONGE) IMPLANT
GLOVE BIOGEL PI IND STRL 7.0 (GLOVE) ×1 IMPLANT
GLOVE SURG SYN 6.5 ES PF (GLOVE) ×1 IMPLANT
GLOVE SURG SYN 6.5 PF PI (GLOVE) ×1 IMPLANT
GOWN STRL REUS W/ TWL LRG LVL3 (GOWN DISPOSABLE) ×2 IMPLANT
KIT TURNOVER KIT A (KITS) ×1 IMPLANT
LABEL OR SOLS (LABEL) ×1 IMPLANT
MANIFOLD NEPTUNE II (INSTRUMENTS) ×1 IMPLANT
NDL HYPO 22X1.5 SAFETY MO (MISCELLANEOUS) ×1 IMPLANT
NEEDLE HYPO 22X1.5 SAFETY MO (MISCELLANEOUS) ×2 IMPLANT
NS IRRIG 1000ML POUR BTL (IV SOLUTION) ×1 IMPLANT
PACK BASIN MINOR ARMC (MISCELLANEOUS) ×1 IMPLANT
SUT ETHILON 3-0 (SUTURE) IMPLANT
SUT SILK 3 0 SH 30 (SUTURE) IMPLANT
SUT VIC AB 3-0 SH 27X BRD (SUTURE) ×1 IMPLANT
SUTURE EHLN 3-0 FS-10 30 BLK (SUTURE) IMPLANT
SUTURE MNCRL 4-0 27XMF (SUTURE) ×1 IMPLANT
SYR 20ML LL LF (SYRINGE) ×1 IMPLANT
TOWEL OR 17X26 4PK STRL BLUE (TOWEL DISPOSABLE) IMPLANT
TRAP FLUID SMOKE EVACUATOR (MISCELLANEOUS) ×1 IMPLANT
WATER STERILE IRR 500ML POUR (IV SOLUTION) ×1 IMPLANT

## 2024-04-11 NOTE — Op Note (Signed)
 Pre-Op Dx: Leg lesion right leg Post-Op Dx: Same Anesthesia: MAC EBL: 20 mL Complications:  none apparent Specimen: Leg lesion right leg Procedure: excisional biopsy of right leg lesion Surgeon: Rosea Conch  Indications for procedure: See H&P  Description of Procedure:  Consent obtained, time out performed.  Patient placed in supine position.  Area sterilized and draped in usual position.  Local infused to area previously marked.  10 cm elliptical incision made through dermis around lesion with 15blade  The  2.5 cm x 4 cm lesion then removed from surrounding tissue completely using electrocautery, passed off field pending pathology.  Wound hemostasis noted, then closed with combination of interrupted and horizontal mattress 3-0 nylon.  Wound then dressed with Mepilex.  Pt tolerated procedure well, and transferred to PACU in stable condition. Sponge and instrument count correct at end of procedure.

## 2024-04-11 NOTE — Anesthesia Preprocedure Evaluation (Addendum)
 Anesthesia Evaluation  Patient identified by MRN, date of birth, ID band Patient awake    Reviewed: Allergy & Precautions, H&P , NPO status , Patient's Chart, lab work & pertinent test results  Airway Mallampati: II  TM Distance: >3 FB Neck ROM: full    Dental no notable dental hx.    Pulmonary shortness of breath, asthma , sleep apnea    Pulmonary exam normal        Cardiovascular hypertension, + dysrhythmias Atrial Fibrillation      Neuro/Psych  PSYCHIATRIC DISORDERS Anxiety      Neuromuscular disease (sciatica)    GI/Hepatic negative GI ROS, Neg liver ROS,,,  Endo/Other    Class 3 obesity  Renal/GU negative Renal ROS  negative genitourinary   Musculoskeletal  (+) Arthritis ,    Abdominal  (+) + obese  Peds  Hematology negative hematology ROS (+)   Anesthesia Other Findings Per cardiology:   Preoperative Cardiovascular Risk Assessment: - Patient's RCRI score is 0.9%   The patient affirms he has been doing well without any new cardiac symptoms. They are able to achieve 4 METS without cardiac limitations. Therefore, based on ACC/AHA guidelines, the patient would be at acceptable risk for the planned procedure without further cardiovascular testing. The patient was advised that if he develops new symptoms prior to surgery to contact our office to arrange for a follow-up visit, and he verbalized understanding.    The patient was advised that if he develops new symptoms prior to surgery to contact our office to arrange for a follow-up visit, and he verbalized understanding.   Per office protocol, patient can hold Eliquis  for 2 days prior to procedure.    Past Medical History: No date: Arthritis of back 08/2019: Atrial fibrillation (HCC)     Comment:  a.) CHA2DS2VASc = 1 (age) as of 04/09/2024; b.) s/p DCCV              07/13/2021 --> 200J x 1 --> NSR with ERAF within 2 days;               c.) rate/rhythm maintained  on oral metoprolol  succinate;               chronically anticoagulated with apixaban  No date: Bradycardia 2012: Cancer of prostate (HCC)     Comment:  a.) s/p robotic assisted prostatectomy in 2012 at [facility] No date: Childhood asthma No date: Dyspnea No date: Gout No date: Gout No date: History of chicken pox No date: History of kidney stones No date: History of measles No date: History of mumps No date: HTN (hypertension) No date: Lumbar stenosis No date: Melanoma in situ (HCC) No date: OAB (overactive bladder) No date: On apixaban  therapy No date: OSA on CPAP     Comment:  a.) only utilizes nocturnal PAP therapy "occassionally" No date: Panic attacks No date: RBBB (right bundle branch block) No date: Vitamin D  deficiency  Past Surgical History: 07/13/2021: CARDIOVERSION; N/A     Comment:  Procedure: CARDIOVERSION;  Surgeon: Constancia Delton,               MD;  Location: [facility] ORS;  Service: Cardiovascular;                Laterality: N/A; 04/2021: CATARACT EXTRACTION; Bilateral 09/18/2017: CHOLECYSTECTOMY; N/A     Comment:  Procedure: LAPAROSCOPIC CHOLECYSTECTOMY WITH               INTRAOPERATIVE CHOLANGIOGRAM;  Surgeon: Glenetta Lane  W, MD;  Location: [facility] ORS;  Service: General;                Laterality: N/A; Jan 2003, 12-17-13: COLONOSCOPY     Comment:  Dr Marquita Situ 01/26/2021: COLONOSCOPY WITH PROPOFOL ; N/A     Comment:  Procedure: COLONOSCOPY WITH PROPOFOL ;  Surgeon: Marshall Skeeter, MD;  Location: [facility] ENDOSCOPY;  Service:               Endoscopy;  Laterality: N/A;  COVID POSITIVE 01/03/2021 07/2013: KIDNEY STONE REMOVAL AND URETERAL STENTING 05/16/2011: PROSTATECTOMY     Comment:  [facility]. Abdominal Laparoscopic, robotic assisted  BMI    Body Mass Index: 45.11 kg/m      Reproductive/Obstetrics negative OB ROS                             Anesthesia Physical Anesthesia Plan  ASA: 3  Anesthesia Plan: MAC    Post-op Pain Management: Regional block*   Induction: Intravenous  PONV Risk Score and Plan:   Airway Management Planned: Natural Airway  Additional Equipment:   Intra-op Plan:   Post-operative Plan:   Informed Consent: I have reviewed the patients History and Physical, chart, labs and discussed the procedure including the risks, benefits and alternatives for the proposed anesthesia with the patient or authorized representative who has indicated his/her understanding and acceptance.     Dental Advisory Given  Plan Discussed with: CRNA and Surgeon  Anesthesia Plan Comments: (MAC + Local per surgeon with back--up GA as needed)       Anesthesia Quick Evaluation

## 2024-04-11 NOTE — Transfer of Care (Signed)
 Immediate Anesthesia Transfer of Care Note  Patient: Charles Floyd  Procedure(s) Performed: EXCISION, LESION, LOWER EXTREMITY (Right: Leg Lower)  Patient Location: PACU  Anesthesia Type:General  Level of Consciousness: awake, alert , and oriented  Airway & Oxygen Therapy: Patient Spontanous Breathing  Post-op Assessment: Report given to RN and Post -op Vital signs reviewed and stable  Post vital signs: stable  Last Vitals:  Vitals Value Taken Time  BP    Temp    Pulse 102 04/11/24 1305  Resp 18 04/11/24 1305  SpO2 94 % 04/11/24 1305  Vitals shown include unfiled device data.  Last Pain:  Vitals:   04/11/24 1132  PainSc: 9       Patients Stated Pain Goal: 3 (04/11/24 1132)  Complications: No notable events documented.

## 2024-04-11 NOTE — H&P (Signed)
 Subjective:   CC: Skin lesion of right leg [L98.9]  HPI:  Charles Floyd is a 73 y.o. male who was referred by Debera Lat, PA for evaluation of above. First noted 6 months ago.  Trauma to area and though it was healing but now has started to form a palpable lump.  No reported discharge.   Past Medical History:  has a past medical history of A-fib (CMS/HHS-HCC) (08/2019), Arthritis, Asthma (HHS-HCC), Gout, History of chicken pox, History of kidney stones, History of measles, History of mumps, Panic attacks, Prostate cancer (CMS/HHS-HCC) (2012), and Sleep apnea.  Past Surgical History:  has a past surgical history that includes prostate removal (05/16/2011); kidney stone extraction (2015); Laparoscopic Prostatectomy (05/2011); Lumbar epidural injection (02/16/2020); cholecystectomy (09/18/2017); Colonoscopy (12/2001); Colonoscopy; Colonoscopy (01/26/2021); extraction cataract extracapsular w/insertion intraocular prosthesis (Right, 03/22/2021); and extraction cataract extracapsular w/insertion intraocular prosthesis (Left, 04/05/2021).  Family History: family history includes Anemia in his mother; Breast cancer in his mother; Colon cancer in his mother; Coronary Artery Disease (Blocked arteries around heart) in his father and mother; Emphysema in his father; Heart disease in his father; Heart failure in his father; Stroke in his mother; Tuberculosis in his father.  Social History:  reports that he has never smoked. He has never used smokeless tobacco. He reports current alcohol use. He reports that he does not use drugs.  Current Medications: has a current medication list which includes the following prescription(s): acetaminophen, allopurinol, apixaban, bisacodyl, fluticasone propionate, gabapentin, hydrocodone-acetaminophen, metoprolol succinate, metoprolol tartrate, polyethylene glycol, promethazine-dextromethorphan, and tramadol.  Allergies:  No Known Allergies  ROS:  A 15 point review of  systems was performed and pertinent positives and negatives noted in HPI   Objective:     BP 92/65   Pulse 89   Ht 181.6 cm (5' 11.5")   Wt (!) 152.9 kg (337 lb)   BMI 46.35 kg/m   Constitutional :  No distress, cooperative, alert  Lymphatics/Throat:  Supple with no lymphadenopathy  Respiratory:  Clear to auscultation bilaterally  Cardiovascular:  Regular rate and rhythm  Gastrointestinal: Soft, non-tender, non-distended, no organomegaly.  Musculoskeletal: Steady gait and movement  Skin: Cool and moist, right shin area, 2 x 2 raised scaly lesion that is slightly tender, non-fluctuant, no surrounding erythema, extensive dry skin. surgical scars  Psychiatric: Normal affect, non-agitated, not confused         LABS:  N/a   RADS: N/a  Assessment:      Skin lesion of right leg [L98.9] Inicisional biopsy at minium to determine etiology Ortho possible hip surgery so will try to avoid large wound Plan:     1. Skin lesion of right leg [L98.9] Discussed surgical excision.  Alternatives include continued observation.  Benefits include possible symptom relief, pathologic evaluation, improved cosmesis. Discussed the risk of surgery including recurrence, chronic pain, post-op infxn, poor cosmesis, poor/delayed wound healing, and possible re-operation to address said risks. The risks of general anesthetic, if used, includes MI, CVA, sudden death or even reaction to anesthetic medications also discussed.  Typical post-op recovery time of 3-5 days with possible activity restrictions were also discussed.  The patient verbalized understanding and all questions were answered to the patient's satisfaction.  2. Patient has elected to proceed with surgical treatment. Procedure will be scheduled. Hold eliquis 3 days.   labs/images/medications/previous chart entries reviewed personally and relevant changes/updates noted above.

## 2024-04-11 NOTE — Discharge Instructions (Addendum)
 Removal, Care After This sheet gives you information about how to care for yourself after your procedure. Your health care provider may also give you more specific instructions. If you have problems or questions, contact your health care provider. What can I expect after the procedure? After the procedure, it is common to have: Soreness. Bruising. Itching. Follow these instructions at home: site care Follow instructions from your health care provider about how to take care of your site. Make sure you: Wash your hands with soap and water before and after you change your bandage (dressing). If soap and water are not available, use hand sanitizer. Leave stitches (sutures), skin glue, or adhesive strips in place. These skin closures may need to stay in place for 2 weeks or longer. If adhesive strip edges start to loosen and curl up, you may trim the loose edges. Do not remove adhesive strips completely unless your health care provider tells you to do that. If the area bleeds or bruises, apply gentle pressure for 10 minutes. NO SHOWER UNTIL SEEN IN OFFICE  Check your site every day for signs of infection. Check for: Redness, swelling, or pain. Fluid or blood. Warmth. Pus or a bad smell.  General instructions Rest and then return to your normal activities as told by your health care provider. RESUME ELIQUIS  IN 48HRS  tylenol  needed for discomfort.    Use narcotics, if prescribed, only when tylenol  is not enough to control pain.  325-650mg  every 8hrs to max of 3000mg /24hrs (including the 325mg  in every norco dose) for the tylenol .    Advil up to 800mg  per dose every 8hrs as needed for pain.   Keep all follow-up visits as told by your health care provider. This is important. Contact a health care provider if: You have redness, swelling, or pain around your site. You have fluid or blood coming from your site. Your site feels warm to the touch. You have pus or a bad smell coming from your  site. You have a fever. Your sutures, skin glue, or adhesive strips loosen or come off sooner than expected. Get help right away if: You have bleeding that does not stop with pressure or a dressing. Summary After the procedure, it is common to have some soreness, bruising, and itching at the site. Follow instructions from your health care provider about how to take care of your site. Check your site every day for signs of infection. Contact a health care provider if you have redness, swelling, or pain around your site, or your site feels warm to the touch. Keep all follow-up visits as told by your health care provider. This is important. This information is not intended to replace advice given to you by your health care provider. Make sure you discuss any questions you have with your health care provider. Document Released: 12/17/2015 Document Revised: 05/20/2018 Document Reviewed: 05/20/2018 Elsevier Interactive Patient Education  [employer].

## 2024-04-11 NOTE — Interval H&P Note (Signed)
 History and Physical Interval Note:  04/11/2024 11:40 AM  Charles Floyd  has presented today for surgery, with the diagnosis of L98.9  Skin lesion leg right.  The various methods of treatment have been discussed with the patient and family. After consideration of risks, benefits and other options for treatment, the patient has consented to  Procedure(s): EXCISION, LESION, LOWER EXTREMITY (Right) as a surgical intervention.  The patient's history has been reviewed, patient examined, no change in status, stable for surgery.  I have reviewed the patient's chart and labs.  Questions were answered to the patient's satisfaction.     [name]

## 2024-04-14 ENCOUNTER — Encounter: Payer: Self-pay | Admitting: Surgery

## 2024-04-15 LAB — SURGICAL PATHOLOGY

## 2024-04-21 NOTE — Anesthesia Postprocedure Evaluation (Signed)
 Anesthesia Post Note  Patient: Charles Floyd  Procedure(s) Performed: EXCISION, LESION, LOWER EXTREMITY (Right: Leg Lower)  Patient location during evaluation: PACU Anesthesia Type: MAC Level of consciousness: awake and alert Pain management: pain level controlled Vital Signs Assessment: post-procedure vital signs reviewed and stable Respiratory status: spontaneous breathing, nonlabored ventilation and respiratory function stable Cardiovascular status: blood pressure returned to baseline and stable Postop Assessment: no apparent nausea or vomiting Anesthetic complications: no   There were no known notable events for this encounter.   Last Vitals:  Vitals:   04/11/24 1345 04/11/24 1400  BP: 99/85 114/71  Pulse: 91 83  Resp: 18 18  Temp: 36.6 C   SpO2: 97% 98%    Last Pain:  Vitals:   04/11/24 1345  PainSc: 9                  Emory Gallentine [address]

## 2024-04-22 ENCOUNTER — Encounter (INDEPENDENT_AMBULATORY_CARE_PROVIDER_SITE_OTHER): Payer: Self-pay

## 2024-05-07 ENCOUNTER — Ambulatory Visit: Attending: Nurse Practitioner | Admitting: Nurse Practitioner

## 2024-05-07 ENCOUNTER — Encounter: Payer: Self-pay | Admitting: Nurse Practitioner

## 2024-05-07 VITALS — BP 100/64 | HR 89 | Ht 71.5 in | Wt 331.2 lb

## 2024-05-07 DIAGNOSIS — G4733 Obstructive sleep apnea (adult) (pediatric): Secondary | ICD-10-CM | POA: Insufficient documentation

## 2024-05-07 DIAGNOSIS — I4821 Permanent atrial fibrillation: Secondary | ICD-10-CM | POA: Insufficient documentation

## 2024-05-07 MED ORDER — DABIGATRAN ETEXILATE MESYLATE 150 MG PO CAPS: 150.0000 mg | ORAL_CAPSULE | Freq: Two times a day (BID) | ORAL | 11 refills | Status: AC

## 2024-05-07 MED ORDER — DABIGATRAN ETEXILATE MESYLATE 150 MG PO CAPS: 150.0000 mg | ORAL_CAPSULE | Freq: Two times a day (BID) | ORAL | 6 refills | Status: DC

## 2024-05-07 NOTE — Patient Instructions (Signed)
 Medication Instructions:  Your physician recommends the following medication changes.  STOP TAKING: Eliquis   START TAKING: Dabigatran (Pradaxa) 150 mg twice daily   *If you need a refill on your cardiac medications before your next appointment, please call your pharmacy*  Lab Work: Your provider would like for you to return in 1 month to have the following labs drawn: CBC and BMeT.   Please go to [facility] [address] ([facility] [address], [place] [address] You do not need an appointment.  They are open from 8 am- 4:30 pm.  Lunch from 1:00 pm- 2:00 pm You DO NOT need to be fasting.   If you have labs (blood work) drawn today and your tests are completely normal, you will receive your results only by: MyChart Message (if you have MyChart) OR A paper copy in the mail If you have any lab test that is abnormal or we need to change your treatment, we will call you to review the results.  Follow-Up: At [facility], you and your health needs are our priority.  As part of our continuing mission to provide you with exceptional heart care, our providers are all part of one team.  This team includes your primary Cardiologist (physician) and Advanced Practice Providers or APPs (Physician Assistants and Nurse Practitioners) who all work together to provide you with the care you need, when you need it.  Your next appointment:   1 year(s)  Provider:   Constancia Delton, MD

## 2024-05-07 NOTE — Progress Notes (Signed)
 Office Visit    Patient Name: Charles Floyd Date of Encounter: 05/07/2024  Primary Care Provider:  Mazie Speed, MD Primary Cardiologist:  Constancia Delton, MD  Chief Complaint    73 y.o. male with a history of permanent atrial fibrillation, morbid obesity, sleep apnea, hypertension, and gout, who presents for follow-up related to A-fib.  Past Medical History   Subjective   Past Medical History:  Diagnosis Date   Arthritis of back    Atrial fibrillation (HCC) 08/2019   a.) CHA2DS2VASc = 1 (age) as of 04/09/2024; b.) s/p DCCV 07/13/2021 --> 200J x 1 --> NSR with ERAF within 2 days; c.) rate/rhythm maintained on oral metoprolol  succinate; chronically anticoagulated with apixaban .   Bradycardia    Cancer of prostate (HCC) 2012   a.) s/p robotic assisted prostatectomy in 2012 at [facility]   Childhood asthma    Dyspnea    Gout    Gout    History of chicken pox    History of echocardiogram    a. 09/2019 Echo: EF 55-60%, no rwma, nl RV fxn, trace MR, mild TR.   History of kidney stones    History of measles    History of mumps    HTN (hypertension)    Lumbar stenosis    Melanoma in situ (HCC)    OAB (overactive bladder)    On apixaban  therapy    OSA on CPAP    a.) only utilizes nocturnal PAP therapy "occassionally"   Panic attacks    RBBB (right bundle branch block)    Vitamin D  deficiency    Past Surgical History:  Procedure Laterality Date   CARDIOVERSION N/A 07/13/2021   Procedure: CARDIOVERSION;  Surgeon: Constancia Delton, MD;  Location: [facility] ORS;  Service: Cardiovascular;  Laterality: N/A;   CATARACT EXTRACTION Bilateral 04/2021   CHOLECYSTECTOMY N/A 09/18/2017   Procedure: LAPAROSCOPIC CHOLECYSTECTOMY WITH INTRAOPERATIVE CHOLANGIOGRAM;  Surgeon: Marshall Skeeter, MD;  Location: [facility] ORS;  Service: General;  Laterality: N/A;   COLONOSCOPY  Jan 2003, 12-17-13   Dr Marquita Situ   COLONOSCOPY WITH PROPOFOL  N/A 01/26/2021   Procedure: COLONOSCOPY WITH  PROPOFOL ;  Surgeon: Marshall Skeeter, MD;  Location: [facility] ENDOSCOPY;  Service: Endoscopy;  Laterality: N/A;  COVID POSITIVE 01/03/2021   KIDNEY STONE REMOVAL AND URETERAL STENTING  07/2013   LESION REMOVAL Right 04/11/2024   Procedure: EXCISION, LESION, LOWER EXTREMITY;  Surgeon: Conrado Delay, DO;  Location: [facility] ORS;  Service: General;  Laterality: Right;   PROSTATECTOMY  05/16/2011   [facility]. Abdominal Laparoscopic, robotic assisted    Allergies  No Known Allergies     History of Present Illness      73 y.o. y/o male with a history of permanent atrial fibrillation, morbid obesity, sleep apnea, hypertension, and gout.  He was initially diagnosed with persistent atrial fibrillation in October 2020, at which time he presented to the emergency department because his Apple Watch told him he was in A-fib.  He was placed beta-blocker and Eliquis  therapy and established care with Dr. Junnie Olives.  Echocardiogram at that time showed normal LV function without significant valvular disease.  He was well rate controlled at follow-up and initially opted to forego cardioversion.  In early 2022, he noted worsening fatigue and decision was made to pursue cardioversion.  This was performed in July 2022 however, he had early return of A-fib and has been rate controlled since.   [name] was last seen in cardiology clinic in April 2024, at which time  he was doing well.  Over the past year, he has done well from a cardiac standpoint.  He does not experience chest pain or palpitations.  He has chronic dyspnea on exertion which is unchanged.  Unfortunately, he has had worsening left hip pain and has been seen by orthopedics with recommendation for weight loss and possibly surgery in the future.  He has been working with his primary care provider regarding weight loss and is now on naltrexone  and bupropion .  He is down about nearly about 20 pounds since April of last year.  In the setting of hip pain, he is very  sedentary.  He denies PND, orthopnea, dizziness, syncope, edema, or early satiety.  He has a wound on his right anterior lower leg in the setting of prior excisional biopsy of lesion, diagnosis of well-differentiated squamous cell carcinoma. Objective   Home Medications    Current Outpatient Medications  Medication Sig Dispense Refill   acetaminophen  (TYLENOL ) 650 MG CR tablet Take 1,300 mg by mouth every 8 (eight) hours as needed for pain.     apixaban  (ELIQUIS ) 5 MG TABS tablet Take 1 tablet (5 mg total) by mouth 2 (two) times daily. 180 tablet 1   bisacodyl  (DULCOLAX) 5 MG EC tablet Take 5 mg by mouth daily as needed for moderate constipation.     buPROPion  (WELLBUTRIN  XL) 150 MG 24 hr tablet Take 1 tablet (150 mg total) by mouth daily. 30 tablet 3   cholecalciferol (VITAMIN D3) 25 MCG (1000 UNIT) tablet Take 1,000 Units by mouth daily.     gabapentin  (NEURONTIN ) 300 MG capsule Take 1 capsule (300 mg total) by mouth 3 (three) times daily. (Patient taking differently: Take 300 mg by mouth 2 (two) times daily.) 90 capsule 3   metoprolol  succinate (TOPROL  XL) 25 MG 24 hr tablet Take 1 tablet (25 mg total) by mouth daily. 90 tablet 0   naltrexone  (DEPADE) 50 MG tablet Take 0.5 tablets (25 mg total) by mouth daily. 15 tablet 3   traMADol  (ULTRAM ) 50 MG tablet Take 1 tablet (50 mg total) by mouth every 6 (six) hours as needed. 20 tablet 0   No current facility-administered medications for this visit.     Physical Exam    VS:  BP 100/64 (BP Location: Left Arm, Patient Position: Sitting, Cuff Size: Large)   Pulse 89   Ht 5' 11.5" (1.816 m)   Wt (!) 331 lb 4 oz (150.3 kg)   SpO2 96%   BMI 45.56 kg/m  , BMI Body mass index is 45.56 kg/m.       GEN: This, in no acute distress. HEENT: normal. Neck: Supple, base, difficult to gauge JVP.  No bruits or masses.  Cardiac: Irregularly irregular, distant, no murmurs, rubs, or gallops. No clubbing, cyanosis, edema.  Lower extremities are very dry.   Anterior right lower leg wound noted without erythema or drainage.  Radials 2+/PT 2+ and equal bilaterally.  Respiratory:  Respirations regular and unlabored, clear to auscultation bilaterally. GI: Obese, soft, nontender, nondistended, BS + x 4. MS: no deformity or atrophy. Skin: warm and dry, no rash. Neuro:  Strength and sensation are intact. Psych: Normal affect.  Accessory Clinical Findings    ECG personally reviewed by me today - EKG Interpretation Date/Time:  Wednesday May 07 2024 13:16:23 EDT Ventricular Rate:  89 PR Interval:    QRS Duration:  144 QT Interval:  362 QTC Calculation: 440 R Axis:   23  Text Interpretation: Atrial fibrillation with  premature ventricular or aberrantly conducted complexes Right bundle branch block Confirmed by Laneta Pintos [phone]) on 05/07/2024 1:21:22 PM  - no acute changes.  Lab Results  Component Value Date   WBC 10.2 04/01/2024   HGB 14.7 04/01/2024   HCT 44.8 04/01/2024   MCV 96 04/01/2024   PLT 213 04/01/2024   Lab Results  Component Value Date   CREATININE 0.94 04/01/2024   BUN 21 04/01/2024   NA 142 04/01/2024   K 4.8 04/01/2024   CL 100 04/01/2024   CO2 26 04/01/2024   Lab Results  Component Value Date   ALT 15 04/01/2024   AST 20 04/01/2024   ALKPHOS 61 04/01/2024   BILITOT 0.9 04/01/2024   Lab Results  Component Value Date   CHOL 144 04/01/2024   HDL 44 04/01/2024   LDLCALC 89 04/01/2024   TRIG 49 04/01/2024   CHOLHDL 3.3 04/01/2024    Lab Results  Component Value Date   TSH 4.142 11/03/2019       Assessment & Plan    1.  Permanent atrial fibrillation: Initially diagnosed in 2020 with subsequent attempted cardioversion 2022, which was unsuccessful.  He remains rate controlled on beta-blocker therapy.  He has been chronic anticoagulated with Eliquis  however, he has had significant issues with the cost.  Currently costing about 380 dollars a month.  He would like to try an alternate.  We discussed less  expensive options like generic dabigatran versus warfarin.  He would like to try dabigatran.  I have discontinued Eliquis  and sent in a prescription for dabigatran 150 mg twice daily to his pharmacy.  I also printed out a coupon via [employer].  If his costs remain too high, we will plan to transition to warfarin in the future.  Continue current dose of beta-blocker.  2.  Morbid obesity: Patient now on bupropion  and naltrexone  and has noted weight loss, as he is hoping to lose enough weight to make him a candidate for left hip surgery.  He is down about 20 pounds since April of last year.  He has adjusted his diet some.  He is an active in the setting of chronic left hip pain and chronic, stable dyspnea on exertion.  3.  Obstructive sleep apnea: Historically noncompliant with CPAP in the setting of intolerance of mask.  4.  Disposition: Will plan to follow-up CBC and basic metabolic panel in 1 month given switched from Eliquis  to dabigatran.  Otherwise, follow-up with cardiology in 1 year.  Laneta Pintos, NP 05/07/2024, 1:25 PM

## 2024-05-14 DIAGNOSIS — C44722 Squamous cell carcinoma of skin of right lower limb, including hip: Secondary | ICD-10-CM | POA: Diagnosis not present

## 2024-06-03 DIAGNOSIS — C44722 Squamous cell carcinoma of skin of right lower limb, including hip: Secondary | ICD-10-CM | POA: Diagnosis not present

## 2024-06-03 DIAGNOSIS — S81801A Unspecified open wound, right lower leg, initial encounter: Secondary | ICD-10-CM | POA: Diagnosis not present

## 2024-06-24 DIAGNOSIS — M1612 Unilateral primary osteoarthritis, left hip: Secondary | ICD-10-CM | POA: Diagnosis not present

## 2024-06-24 DIAGNOSIS — M25552 Pain in left hip: Secondary | ICD-10-CM | POA: Diagnosis not present

## 2024-06-24 DIAGNOSIS — G8929 Other chronic pain: Secondary | ICD-10-CM | POA: Diagnosis not present

## 2024-06-24 DIAGNOSIS — Z6841 Body Mass Index (BMI) 40.0 and over, adult: Secondary | ICD-10-CM | POA: Diagnosis not present

## 2024-06-27 DIAGNOSIS — M1612 Unilateral primary osteoarthritis, left hip: Secondary | ICD-10-CM | POA: Diagnosis not present

## 2024-06-28 ENCOUNTER — Other Ambulatory Visit: Payer: Self-pay | Admitting: Cardiology

## 2024-07-01 ENCOUNTER — Encounter: Payer: Self-pay | Admitting: Family Medicine

## 2024-07-01 ENCOUNTER — Ambulatory Visit: Admitting: Family Medicine

## 2024-07-01 DIAGNOSIS — S81801S Unspecified open wound, right lower leg, sequela: Secondary | ICD-10-CM

## 2024-07-01 DIAGNOSIS — I482 Chronic atrial fibrillation, unspecified: Secondary | ICD-10-CM

## 2024-07-01 DIAGNOSIS — G4733 Obstructive sleep apnea (adult) (pediatric): Secondary | ICD-10-CM

## 2024-07-01 DIAGNOSIS — M160 Bilateral primary osteoarthritis of hip: Secondary | ICD-10-CM | POA: Diagnosis not present

## 2024-07-01 DIAGNOSIS — S81801A Unspecified open wound, right lower leg, initial encounter: Secondary | ICD-10-CM

## 2024-07-01 MED ORDER — BUPROPION HCL ER (XL) 300 MG PO TB24: 300.0000 mg | ORAL_TABLET | Freq: Every day | ORAL | 1 refills | Status: AC

## 2024-07-01 NOTE — Assessment & Plan Note (Signed)
 Atrial fibrillation managed with Pradaxa , switched from Eliquis  due to cost. No issues reported with the new medication and recent EKG was normal.

## 2024-07-01 NOTE — Assessment & Plan Note (Signed)
 Pain management is challenging due to interactions between naltrexone  and tramadol . Tramadol  was discontinued due to adverse effects. Physical activity is limited, affecting muscle strength and rehabilitation potential post-surgery. - Continue Tylenol  and gabapentin  for pain management. - Avoid tramadol  due to interaction with naltrexone .

## 2024-07-01 NOTE — Assessment & Plan Note (Signed)
 Obstructive sleep apnea with nonadherence to CPAP. Recent non-use of CPAP, particularly during vacation. - Encourage resumption of CPAP use for cardiovascular health.

## 2024-07-01 NOTE — Assessment & Plan Note (Signed)
 Non-healing lower extremity wound previously managed with a wound vac, which was discontinued due to discomfort and lack of efficacy. Wound is not infected but is not healing optimally. - Continue wet-to-dry dressings with Vaseline to keep the wound moisturized. - Schedule follow-up appointment with Dr. Tye for wound management.

## 2024-07-01 NOTE — Assessment & Plan Note (Signed)
 Obesity managed with Wellbutrin  and naltrexone  for weight loss. Current weight is 320 lbs, down from 349 lbs in January. Target weight for surgery is 290 lbs, with consideration at 300 lbs. Weight loss is progressing with 11 lbs lost in the last six weeks. Difficulty with activity due to hip pain impacts weight loss efforts. - Increase Wellbutrin  to 300 mg daily. If 150 mg tablets are available, take two until he runs out, then switch to 300 mg tablets. - Continue naltrexone  at current dose. - Encourage continued weight loss efforts.

## 2024-07-01 NOTE — Progress Notes (Signed)
 Established patient visit   Patient: Charles Floyd   DOB: [birth year]   73 y.o. Male  MRN: [id number] Visit Date: 07/01/2024  Today's healthcare provider: Jon Eva, MD   Chief Complaint  Patient presents with   Medical Management of Chronic Issues   Subjective    HPI   Discussed the use of AI scribe software for clinical note transcription with the patient, who gave verbal consent to proceed.  History of Present Illness   Charles Floyd is a 73 year old male who presents for weight management and pain control.  He experiences chronic hip pain that is persistent and debilitating, affecting his ability to be active. An injection last Friday provided no relief. He is actively working on weight loss, having reduced from 349 pounds in January to 320 pounds currently. His weight loss is due to dietary changes, including increased water intake and reduced soft drink consumption. Despite the weight loss, he feels inactive due to hip pain.  He takes Wellbutrin  and naltrexone  for appetite control, with Wellbutrin  taken in the morning to avoid interference with pain medication. He has stopped tramadol  due to adverse effects and manages pain with Tylenol  and gabapentin . He acknowledges muscle weakness, particularly in his legs, which he attributes to inactivity due to hip pain.  He has a history of a non-healing wound on his leg, previously managed with a wound vac, now using wet to dry dressings and Vaseline. He switched from Eliquis  to generic Pradaxa  for anticoagulation due to cost, with no issues reported. He mentions inconsistent use of his CPAP machine, attributed to recent vacation and personal habits.         Medications: Outpatient Medications Prior to Visit  Medication Sig   acetaminophen  (TYLENOL ) 650 MG CR tablet Take 1,300 mg by mouth every 8 (eight) hours as needed for pain.   bisacodyl  (DULCOLAX) 5 MG EC tablet Take 5 mg by mouth daily as needed for  moderate constipation.   cholecalciferol (VITAMIN D3) 25 MCG (1000 UNIT) tablet Take 1,000 Units by mouth daily.   dabigatran  (PRADAXA ) 150 MG CAPS capsule Take 1 capsule (150 mg total) by mouth 2 (two) times daily.   gabapentin  (NEURONTIN ) 300 MG capsule Take 1 capsule (300 mg total) by mouth 3 (three) times daily. (Patient taking differently: Take 300 mg by mouth 2 (two) times daily.)   metoprolol  succinate (TOPROL -XL) 25 MG 24 hr tablet TAKE 1 TABLET (25 MG TOTAL) BY MOUTH DAILY.   naltrexone  (DEPADE) 50 MG tablet Take 0.5 tablets (25 mg total) by mouth daily.   traMADol  (ULTRAM ) 50 MG tablet Take 1 tablet (50 mg total) by mouth every 6 (six) hours as needed.   [DISCONTINUED] buPROPion  (WELLBUTRIN  XL) 150 MG 24 hr tablet Take 1 tablet (150 mg total) by mouth daily.   No facility-administered medications prior to visit.    Review of Systems     Objective    BP 124/84   Pulse 78   Ht 5' 11.5 (1.816 m)   Wt (!) 320 lb 11.2 oz (145.5 kg)   SpO2 94%   BMI 44.11 kg/m    Physical Exam Vitals reviewed.  Constitutional:      General: He is not in acute distress.    Appearance: Normal appearance. He is not diaphoretic.  HENT:     Head: Normocephalic and atraumatic.  Eyes:     General: No scleral icterus.    Conjunctiva/sclera: Conjunctivae normal.  Cardiovascular:  Rate and Rhythm: Normal rate and regular rhythm.     Heart sounds: Normal heart sounds.  Pulmonary:     Effort: Pulmonary effort is normal. No respiratory distress.     Breath sounds: Normal breath sounds. No wheezing or rhonchi.  Abdominal:     General: There is no distension.     Palpations: Abdomen is soft.     Tenderness: There is no abdominal tenderness.  Musculoskeletal:     Cervical back: Neck supple.     Right lower leg: No edema.     Left lower leg: No edema.  Lymphadenopathy:     Cervical: No cervical adenopathy.  Skin:    General: Skin is warm and dry.     Comments: Open wound on R tibia   Neurological:     Mental Status: He is alert. Mental status is at baseline.  Psychiatric:        Mood and Affect: Mood normal.        Behavior: Behavior normal.      No results found for any visits on 07/01/24.  Assessment & Plan     Problem List Items Addressed This Visit       Cardiovascular and Mediastinum   Chronic atrial fibrillation (HCC) (Chronic)   Atrial fibrillation managed with Pradaxa , switched from Eliquis  due to cost. No issues reported with the new medication and recent EKG was normal.        Respiratory   Obstructive sleep apnea on CPAP (Chronic)   Obstructive sleep apnea with nonadherence to CPAP. Recent non-use of CPAP, particularly during vacation. - Encourage resumption of CPAP use for cardiovascular health.        Musculoskeletal and Integument   Osteoarthritis of hips (Bilateral) (Chronic)   Pain management is challenging due to interactions between naltrexone  and tramadol . Tramadol  was discontinued due to adverse effects. Physical activity is limited, affecting muscle strength and rehabilitation potential post-surgery. - Continue Tylenol  and gabapentin  for pain management. - Avoid tramadol  due to interaction with naltrexone .        Other   Morbid obesity (HCC) - Primary   Obesity managed with Wellbutrin  and naltrexone  for weight loss. Current weight is 320 lbs, down from 349 lbs in January. Target weight for surgery is 290 lbs, with consideration at 300 lbs. Weight loss is progressing with 11 lbs lost in the last six weeks. Difficulty with activity due to hip pain impacts weight loss efforts. - Increase Wellbutrin  to 300 mg daily. If 150 mg tablets are available, take two until he runs out, then switch to 300 mg tablets. - Continue naltrexone  at current dose. - Encourage continued weight loss efforts.      Leg wound, right, initial encounter   Non-healing lower extremity wound previously managed with a wound vac, which was discontinued due to  discomfort and lack of efficacy. Wound is not infected but is not healing optimally. - Continue wet-to-dry dressings with Vaseline to keep the wound moisturized. - Schedule follow-up appointment with Dr. Tye for wound management.        Return in about 3 months (around 10/01/2024) for weight f/u.       Jon Eva, MD  [facility] [phone] (phone) [phone] (fax)  [facility]

## 2024-08-25 ENCOUNTER — Other Ambulatory Visit: Payer: Self-pay | Admitting: Family Medicine

## 2024-10-02 ENCOUNTER — Ambulatory Visit: Admitting: Family Medicine

## 2024-10-06 ENCOUNTER — Ambulatory Visit: Admitting: Family Medicine

## 2024-10-06 ENCOUNTER — Encounter: Payer: Self-pay | Admitting: Family Medicine

## 2024-10-06 DIAGNOSIS — Z23 Encounter for immunization: Secondary | ICD-10-CM | POA: Diagnosis not present

## 2024-10-06 DIAGNOSIS — G8929 Other chronic pain: Secondary | ICD-10-CM

## 2024-10-06 DIAGNOSIS — R600 Localized edema: Secondary | ICD-10-CM | POA: Diagnosis not present

## 2024-10-06 DIAGNOSIS — F329 Major depressive disorder, single episode, unspecified: Secondary | ICD-10-CM

## 2024-10-06 DIAGNOSIS — M25552 Pain in left hip: Secondary | ICD-10-CM

## 2024-10-06 DIAGNOSIS — M1612 Unilateral primary osteoarthritis, left hip: Secondary | ICD-10-CM | POA: Diagnosis not present

## 2024-10-06 DIAGNOSIS — S81801A Unspecified open wound, right lower leg, initial encounter: Secondary | ICD-10-CM

## 2024-10-06 DIAGNOSIS — K5903 Drug induced constipation: Secondary | ICD-10-CM | POA: Diagnosis not present

## 2024-10-06 NOTE — Progress Notes (Signed)
 Established patient visit   Patient: Charles Floyd   DOB: September 24, 1951   73 y.o. Male  MRN: [id number] Visit Date: 10/06/2024  Today's healthcare provider: Jon Eva, MD   Chief Complaint  Patient presents with   Medical Management of Chronic Issues   Weight Check   Subjective    HPI   Discussed the use of AI scribe software for clinical note transcription with the patient, who gave verbal consent to proceed.  History of Present Illness   Charles Floyd is a 73 year old male with chronic hip pain and mobility issues who presents with worsening pain and swelling in his legs. He is accompanied by his wife, Charles Floyd.  He experiences persistent hip pain and depression, which are exacerbated by his limited mobility and inability to lose weight. He fears his hip condition might worsen and has experienced falls. His hip feels 'worn out.'  He is inconsistent with his medication regimen, including Wellbutrin  for weight loss and mood improvement, and gabapentin , which he does not take consistently due to timing issues. He takes Tylenol  two to three times a day for pain relief, leading to constipation.  His legs are swelling and leaking fluids, which he attributes to limited mobility. Vein specialists have confirmed good circulation and recommended compression stockings, which he finds difficult to use due to mobility issues. He is currently on Pradaxa , having switched from Eliquis  due to cost concerns. His mobility issues impact his ability to manage constipation and perform daily activities.         Medications: Outpatient Medications Prior to Visit  Medication Sig   acetaminophen  (TYLENOL ) 650 MG CR tablet Take 1,300 mg by mouth every 8 (eight) hours as needed for pain.   bisacodyl  (DULCOLAX) 5 MG EC tablet Take 5 mg by mouth daily as needed for moderate constipation.   buPROPion  (WELLBUTRIN  XL) 300 MG 24 hr tablet Take 1 tablet (300 mg total) by mouth daily.    cholecalciferol (VITAMIN D3) 25 MCG (1000 UNIT) tablet Take 1,000 Units by mouth daily.   dabigatran  (PRADAXA ) 150 MG CAPS capsule Take 1 capsule (150 mg total) by mouth 2 (two) times daily.   gabapentin  (NEURONTIN ) 300 MG capsule TAKE 1 CAPSULE BY MOUTH THREE TIMES A DAY   metoprolol  succinate (TOPROL -XL) 25 MG 24 hr tablet TAKE 1 TABLET (25 MG TOTAL) BY MOUTH DAILY.   naltrexone  (DEPADE) 50 MG tablet Take 0.5 tablets (25 mg total) by mouth daily.   traMADol  (ULTRAM ) 50 MG tablet Take 1 tablet (50 mg total) by mouth every 6 (six) hours as needed.   No facility-administered medications prior to visit.    Review of Systems     Objective    There were no vitals taken for this visit.   Physical Exam Constitutional:      General: He is not in acute distress.    Appearance: Normal appearance. He is not diaphoretic.  HENT:     Head: Normocephalic.  Eyes:     Conjunctiva/sclera: Conjunctivae normal.  Pulmonary:     Effort: Pulmonary effort is normal. No respiratory distress.  Musculoskeletal:     Right lower leg: Edema present.     Left lower leg: Edema present.  Skin:    Comments: Lichenification of b/l LEs, weeping.  Wound of R shin with scabbing  Neurological:     Mental Status: He is alert and oriented to person, place, and time. Mental status is at baseline.  No results found for any visits on 10/06/24.  Assessment & Plan     Problem List Items Addressed This Visit       Musculoskeletal and Integument   Osteoarthritis of hip (Severe) (Left) - Primary (Chronic)   Relevant Orders   Ambulatory referral to Orthopedic Surgery     Other   Chronic hip pain (Left) (Chronic)   Morbid obesity (HCC)   Leg wound, right, initial encounter   Relevant Orders   AMB referral to wound care center   Other Visit Diagnoses       Immunization due       Relevant Orders   Flu vaccine HIGH DOSE PF(Fluzone Trivalent) (Completed)     Drug-induced constipation         Bilateral  lower extremity edema       Relevant Orders   AMB referral to wound care center           Hip Osteoarthritis Chronic hip pain due to osteoarthritis, resulting in significant mobility issues and impacting quality of life. He is unable to undergo hip replacement surgery due to weight requirements, perpetuating a cycle of immobility and weight gain. A second opinion from [facility] is considered for different weight requirements or alternative approaches. He is concerned about potential worsening joint damage and risk of bone fracture. - Refer to [facility] for a second opinion on hip replacement surgery. - Discuss potential alternative approaches if surgery is not feasible.  Chronic Pain Chronic pain primarily due to hip osteoarthritis. Current management includes acetaminophen  and gabapentin . He is concerned about potential need for opioids due to lack of effective non-opioid options. Opioids are considered a last resort due to risk of addiction and constipation. - Continue gabapentin , consider increasing dose if needed. - Discuss potential use of opioids if pain management becomes inadequate.  Depression Chronic depression exacerbated by physical health issues and immobility. He has been off bupropion , which was previously effective for mood and concentration. Current situation contributes to hopelessness and lack of motivation. - Restart bupropion  for mood and concentration improvement. - Plan to reassess mood in December.  Leg Edema and Skin Breakdown Chronic leg edema with skin breakdown and oozing, likely due to immobility and venous insufficiency. Previous vascular assessments showed good circulation. Compression stockings are not feasible due to mobility issues. Una boots are considered to manage edema and prevent infection. - Refer to wound care for [facility] boot application to manage leg edema and skin breakdown.  Constipation Chronic constipation likely exacerbated by pain  medications and immobility. Current use of over-the-counter options like bisacodyl  and docusate is inadequate. Increased fiber intake and use of polyethylene glycol are recommended to improve bowel movements. - Recommend polyethylene glycol for constipation management. - Encourage increased fiber intake.  General Health Maintenance Routine health maintenance is ongoing. His partner mentioned a scheduled mammogram, indicating attention to preventive care.        Return in about 6 weeks (around 11/17/2024) for chronic disease f/u.      I personally spent a total of 42 minutes in the care of the patient today including preparing to see the patient, getting/reviewing separately obtained history, performing a medically appropriate exam/evaluation, counseling and educating, referring and communicating with other health care professionals, documenting clinical information in the EHR, and coordinating care.   Charles Eva, MD  [facility] [phone] (phone) [phone] (fax)  [facility]

## 2024-10-07 NOTE — Assessment & Plan Note (Signed)
 BMI 44  Working on weight loss for being eligible for hip replacement but it is limited by his mobility Did encourage him to resume wellbutrin  as above

## 2024-10-14 ENCOUNTER — Encounter: Attending: Physician Assistant | Admitting: Physician Assistant

## 2024-10-14 DIAGNOSIS — R0600 Dyspnea, unspecified: Secondary | ICD-10-CM | POA: Insufficient documentation

## 2024-10-14 DIAGNOSIS — I480 Paroxysmal atrial fibrillation: Secondary | ICD-10-CM | POA: Diagnosis not present

## 2024-10-14 DIAGNOSIS — I87331 Chronic venous hypertension (idiopathic) with ulcer and inflammation of right lower extremity: Secondary | ICD-10-CM | POA: Insufficient documentation

## 2024-10-14 DIAGNOSIS — Z7901 Long term (current) use of anticoagulants: Secondary | ICD-10-CM | POA: Diagnosis not present

## 2024-10-14 DIAGNOSIS — C44722 Squamous cell carcinoma of skin of right lower limb, including hip: Secondary | ICD-10-CM | POA: Insufficient documentation

## 2024-10-14 DIAGNOSIS — I890 Lymphedema, not elsewhere classified: Secondary | ICD-10-CM | POA: Diagnosis not present

## 2024-10-14 DIAGNOSIS — L97812 Non-pressure chronic ulcer of other part of right lower leg with fat layer exposed: Secondary | ICD-10-CM | POA: Insufficient documentation

## 2024-10-16 ENCOUNTER — Encounter

## 2024-10-16 DIAGNOSIS — I480 Paroxysmal atrial fibrillation: Secondary | ICD-10-CM | POA: Diagnosis not present

## 2024-10-16 DIAGNOSIS — C44722 Squamous cell carcinoma of skin of right lower limb, including hip: Secondary | ICD-10-CM | POA: Diagnosis not present

## 2024-10-16 DIAGNOSIS — I87331 Chronic venous hypertension (idiopathic) with ulcer and inflammation of right lower extremity: Secondary | ICD-10-CM | POA: Diagnosis not present

## 2024-10-16 DIAGNOSIS — L97812 Non-pressure chronic ulcer of other part of right lower leg with fat layer exposed: Secondary | ICD-10-CM | POA: Diagnosis not present

## 2024-10-16 DIAGNOSIS — I890 Lymphedema, not elsewhere classified: Secondary | ICD-10-CM | POA: Diagnosis not present

## 2024-10-21 ENCOUNTER — Encounter: Admitting: Physician Assistant

## 2024-10-23 ENCOUNTER — Encounter: Admitting: Physician Assistant

## 2024-10-23 DIAGNOSIS — C44722 Squamous cell carcinoma of skin of right lower limb, including hip: Secondary | ICD-10-CM | POA: Diagnosis not present

## 2024-10-23 DIAGNOSIS — L97812 Non-pressure chronic ulcer of other part of right lower leg with fat layer exposed: Secondary | ICD-10-CM | POA: Diagnosis not present

## 2024-10-23 DIAGNOSIS — I480 Paroxysmal atrial fibrillation: Secondary | ICD-10-CM | POA: Diagnosis not present

## 2024-10-23 DIAGNOSIS — I890 Lymphedema, not elsewhere classified: Secondary | ICD-10-CM | POA: Diagnosis not present

## 2024-10-23 DIAGNOSIS — I87331 Chronic venous hypertension (idiopathic) with ulcer and inflammation of right lower extremity: Secondary | ICD-10-CM | POA: Diagnosis not present

## 2024-11-17 ENCOUNTER — Ambulatory Visit: Admitting: Family Medicine

## 2024-11-17 ENCOUNTER — Encounter: Payer: Self-pay | Admitting: Family Medicine

## 2024-11-17 ENCOUNTER — Telehealth: Admitting: Family Medicine

## 2024-11-17 DIAGNOSIS — M1612 Unilateral primary osteoarthritis, left hip: Secondary | ICD-10-CM | POA: Diagnosis not present

## 2024-11-17 DIAGNOSIS — M25552 Pain in left hip: Secondary | ICD-10-CM | POA: Diagnosis not present

## 2024-11-17 DIAGNOSIS — G8929 Other chronic pain: Secondary | ICD-10-CM

## 2024-11-17 MED ORDER — TIRZEPATIDE-WEIGHT MANAGEMENT 5 MG/0.5ML ~~LOC~~ SOLN: 5.0000 mg | SUBCUTANEOUS | 1 refills | Status: AC

## 2024-11-17 MED ORDER — TIRZEPATIDE-WEIGHT MANAGEMENT 2.5 MG/0.5ML ~~LOC~~ SOLN: 2.5000 mg | SUBCUTANEOUS | 0 refills | Status: DC

## 2024-11-17 NOTE — Progress Notes (Signed)
 MyChart Video Visit    Virtual Visit via Video Note   This format is felt to be most appropriate for this patient at this time. Physical exam was limited by quality of the video and audio technology used for the visit.    Patient location: home Provider location: [facility] Persons involved in the visit: patient, provider  I discussed the limitations of evaluation and management by telemedicine and the availability of in person appointments. The patient expressed understanding and agreed to proceed.  Patient: Charles Floyd   DOB: 1951-04-06   73 y.o. Male  MRN: [id number] Visit Date: 11/17/2024  Today's healthcare provider: Jon Eva, MD   No chief complaint on file.  Subjective    HPI   Discussed the use of AI scribe software for clinical note transcription with the patient, who gave verbal consent to proceed.  History of Present Illness   Jaimin Krupka is a 73 year old male with severe hip arthritis who presents with persistent pain and difficulty with weight management.  He has constant severe hip pain from arthritis with no relief from prior treatments and is unsure how to manage the pain. He cannot have hip surgery at his current weight. He struggles to lose weight despite efforts and feels frustrated. He takes naltrexone  and Wellbutrin  for weight management but has had minimal benefit.       Review of Systems      Objective    There were no vitals taken for this visit.      Physical Exam Constitutional:      General: He is not in acute distress.    Appearance: Normal appearance. He is not diaphoretic.  HENT:     Head: Normocephalic.  Eyes:     Conjunctiva/sclera: Conjunctivae normal.  Pulmonary:     Effort: Pulmonary effort is normal. No respiratory distress.  Neurological:     Mental Status: He is alert and oriented to person, place, and time. Mental status is at baseline.        Assessment & Plan      Problem List Items Addressed This Visit       Musculoskeletal and Integument   Osteoarthritis of hip (Severe) (Left) (Chronic)   Relevant Orders   Ambulatory referral to Radiation Oncology     Other   Chronic hip pain (Left) - Primary (Chronic)   Relevant Orders   Ambulatory referral to Radiation Oncology   Morbid obesity (HCC)   Relevant Medications   tirzepatide  (ZEPBOUND ) 2.5 MG/0.5ML injection vial   tirzepatide  5 MG/0.5ML injection vial        Primary osteoarthritis of left hip Chronic pain due to primary osteoarthritis of the left hip. Previous treatments have not provided relief. Discussed a new treatment option involving low-dose radiation therapy targeting joint inflammation, which has shown good results in reducing pain. The treatment is not as intense as cancer radiation and is expected to have different side effects. The goal is to bridge the gap until surgery can be performed after weight loss. - Referred to cancer center for consultation on low-dose radiation therapy for hip arthritis.  Morbid obesity Contributing to difficulty in managing hip pain and delaying surgery. Discussed the use of GLP-1 receptor agonists, specifically Zepbound , for short-term weight loss to facilitate surgery. Zepbound  is preferred over [facility] due to better weight loss outcomes. Discussed potential side effects including nausea, vomiting, and constipation, and the need to start at a low dose to minimize these  effects. He prefers to start treatment after the holidays to avoid interference with holiday activities. - Prescribed Zepbound  2.5 mg weekly for 4 weeks, then increase to 5 mg if needed. - Instructed to discontinue naltrexone  and Wellbutrin  upon starting Zepbound . - Plan to start Zepbound  after the first of the year.        Meds ordered this encounter  Medications   tirzepatide  (ZEPBOUND ) 2.5 MG/0.5ML injection vial    Sig: Inject 2.5 mg into the skin once a week.    Dispense:  2  mL    Refill:  0   tirzepatide  5 MG/0.5ML injection vial    Sig: Inject 5 mg into the skin once a week.    Dispense:  2 mL    Refill:  1     Return in about 3 months (around 02/15/2025) for chronic disease f/u, virtual ok.     I discussed the assessment and treatment plan with the patient. The patient was provided an opportunity to ask questions and all were answered. The patient agreed with the plan and demonstrated an understanding of the instructions.   The patient was advised to call back or seek an in-person evaluation if the symptoms worsen or if the condition fails to improve as anticipated.   Jon Eva, MD [facility] [phone] (phone) [phone] (fax)  [facility]

## 2024-11-24 ENCOUNTER — Ambulatory Visit
Admission: RE | Admit: 2024-11-24 | Discharge: 2024-11-24 | Disposition: A | Discharge status: Home / Self Care | Source: Ambulatory Visit | Attending: Radiation Oncology | Admitting: Radiation Oncology

## 2024-11-24 ENCOUNTER — Encounter: Payer: Self-pay | Admitting: Radiation Oncology

## 2024-11-24 VITALS — BP 108/79 | HR 92 | Temp 98.3°F | Resp 16 | Wt 322.0 lb

## 2024-11-24 DIAGNOSIS — I4891 Unspecified atrial fibrillation: Secondary | ICD-10-CM | POA: Insufficient documentation

## 2024-11-24 DIAGNOSIS — Z8546 Personal history of malignant neoplasm of prostate: Secondary | ICD-10-CM | POA: Diagnosis not present

## 2024-11-24 DIAGNOSIS — M1612 Unilateral primary osteoarthritis, left hip: Secondary | ICD-10-CM | POA: Diagnosis present

## 2024-11-24 DIAGNOSIS — Z7901 Long term (current) use of anticoagulants: Secondary | ICD-10-CM | POA: Insufficient documentation

## 2024-11-24 DIAGNOSIS — Z79899 Other long term (current) drug therapy: Secondary | ICD-10-CM | POA: Diagnosis not present

## 2024-11-24 DIAGNOSIS — Z85820 Personal history of malignant melanoma of skin: Secondary | ICD-10-CM | POA: Insufficient documentation

## 2024-11-24 DIAGNOSIS — Z803 Family history of malignant neoplasm of breast: Secondary | ICD-10-CM | POA: Diagnosis not present

## 2024-11-24 DIAGNOSIS — E559 Vitamin D deficiency, unspecified: Secondary | ICD-10-CM | POA: Diagnosis not present

## 2024-11-24 DIAGNOSIS — Z800 Family history of malignant neoplasm of digestive organs: Secondary | ICD-10-CM | POA: Diagnosis not present

## 2024-11-24 DIAGNOSIS — Z87442 Personal history of urinary calculi: Secondary | ICD-10-CM | POA: Diagnosis not present

## 2024-11-24 DIAGNOSIS — M109 Gout, unspecified: Secondary | ICD-10-CM | POA: Insufficient documentation

## 2024-11-24 DIAGNOSIS — M160 Bilateral primary osteoarthritis of hip: Secondary | ICD-10-CM

## 2024-11-24 DIAGNOSIS — I4510 Unspecified right bundle-branch block: Secondary | ICD-10-CM | POA: Insufficient documentation

## 2024-11-24 DIAGNOSIS — I10 Essential (primary) hypertension: Secondary | ICD-10-CM | POA: Insufficient documentation

## 2024-11-24 NOTE — Consult Note (Signed)
 " NEW PATIENT EVALUATION  Name: Charles Floyd  MRN: [id number]  Date:   11/24/2024     DOB: 1951/01/12   This 73 y.o. male patient presents to the clinic for initial evaluation of osteoarthritis of the left hip.  REFERRING PHYSICIAN: Myrla Jon HERO, MD  CHIEF COMPLAINT:  Chief Complaint  Patient presents with   OA of right hip    DIAGNOSIS: The encounter diagnosis was Osteoarthritis of hips (Bilateral).   PREVIOUS INVESTIGATIONS:  MRI of left hip reviewed Labs reviewed Clinical notes reviewed  HPI: Patient is a 73 year old male morbidly obese with severe osteoarthritis of the left hip.  He is being treated currently with gabapentin  as well as Tylenol .  He also occasionally uses tramadol .  He walks with the assistance of a walker.  He had an MRI of his left hip a year ago showing severe left hip osteoarthritis.  He is seen today for consideration of low-dose radiation for osteoarthritis.  PLANNED TREATMENT REGIMEN: LDR  PAST MEDICAL HISTORY:  has a past medical history of Arthritis of back, Atrial fibrillation (HCC) (08/2019), Bradycardia, Cancer of prostate (HCC) (2012), Childhood asthma, Dyspnea, Gout, Gout, History of chicken pox, History of echocardiogram, History of kidney stones, History of measles, History of mumps, HTN (hypertension), Lumbar stenosis, Melanoma in situ (HCC), OAB (overactive bladder), On apixaban  therapy, OSA on CPAP, Panic attacks, RBBB (right bundle branch block), and Vitamin D  deficiency.    PAST SURGICAL HISTORY:  Past Surgical History:  Procedure Laterality Date   CARDIOVERSION N/A 07/13/2021   Procedure: CARDIOVERSION;  Surgeon: Darliss Rogue, MD;  Location: [facility] ORS;  Service: Cardiovascular;  Laterality: N/A;   CATARACT EXTRACTION Bilateral 04/2021   CHOLECYSTECTOMY N/A 09/18/2017   Procedure: LAPAROSCOPIC CHOLECYSTECTOMY WITH INTRAOPERATIVE CHOLANGIOGRAM;  Surgeon: Dessa Reyes LELON, MD;  Location: [facility] ORS;  Service: General;   Laterality: N/A;   COLONOSCOPY  Jan 2003, 12-17-13   Dr Dessa   COLONOSCOPY WITH PROPOFOL  N/A 01/26/2021   Procedure: COLONOSCOPY WITH PROPOFOL ;  Surgeon: Dessa Reyes LELON, MD;  Location: [facility]COPY;  Service: Endoscopy;  Laterality: N/A;  COVID POSITIVE 01/03/2021   KIDNEY STONE REMOVAL AND URETERAL STENTING  07/2013   LESION REMOVAL Right 04/11/2024   Procedure: EXCISION, LESION, LOWER EXTREMITY;  Surgeon: Tye Millet, DO;  Location: [facility] ORS;  Service: General;  Laterality: Right;   PROSTATECTOMY  05/16/2011   [facility]. Abdominal Laparoscopic, robotic assisted    FAMILY HISTORY: family history includes Anemia in his mother; Breast cancer in his mother; CAD in his father and mother; Colon cancer in his mother; Congestive Heart Failure in his father; Emphysema in his father; Tuberculosis in his father.  SOCIAL HISTORY:  reports that he has never smoked. He has never used smokeless tobacco. He reports current alcohol use. He reports that he does not use drugs.  ALLERGIES: Patient has no known allergies.  MEDICATIONS:  Current Outpatient Medications  Medication Sig Dispense Refill   acetaminophen  (TYLENOL ) 650 MG CR tablet Take 1,300 mg by mouth every 8 (eight) hours as needed for pain.     bisacodyl  (DULCOLAX) 5 MG EC tablet Take 5 mg by mouth daily as needed for moderate constipation.     buPROPion  (WELLBUTRIN  XL) 300 MG 24 hr tablet Take 1 tablet (300 mg total) by mouth daily. 90 tablet 1   cholecalciferol (VITAMIN D3) 25 MCG (1000 UNIT) tablet Take 1,000 Units by mouth daily.     dabigatran  (PRADAXA ) 150 MG CAPS capsule Take 1 capsule (150 mg total)  by mouth 2 (two) times daily. 60 capsule 11   gabapentin  (NEURONTIN ) 300 MG capsule TAKE 1 CAPSULE BY MOUTH THREE TIMES A DAY 90 capsule 3   metoprolol  succinate (TOPROL -XL) 25 MG 24 hr tablet TAKE 1 TABLET (25 MG TOTAL) BY MOUTH DAILY. 90 tablet 1   naltrexone  (DEPADE) 50 MG tablet Take 0.5 tablets (25 mg total) by mouth daily. 15  tablet 3   tirzepatide  (ZEPBOUND ) 2.5 MG/0.5ML injection vial Inject 2.5 mg into the skin once a week. 2 mL 0   tirzepatide  5 MG/0.5ML injection vial Inject 5 mg into the skin once a week. 2 mL 1   traMADol  (ULTRAM ) 50 MG tablet Take 1 tablet (50 mg total) by mouth every 6 (six) hours as needed. 20 tablet 0   No current facility-administered medications for this encounter.    ECOG PERFORMANCE STATUS:  2 - Symptomatic, <50% confined to bed  REVIEW OF SYSTEMS: Patient denies any weight loss, fatigue, weakness, fever, chills or night sweats. Patient denies any loss of vision, blurred vision. Patient denies any ringing  of the ears or hearing loss. No irregular heartbeat. Patient denies heart murmur or history of fainting. Patient denies any chest pain or pain radiating to her upper extremities. Patient denies any shortness of breath, difficulty breathing at night, cough or hemoptysis. Patient denies any swelling in the lower legs. Patient denies any nausea vomiting, vomiting of blood, or coffee ground material in the vomitus. Patient denies any stomach pain. Patient states has had normal bowel movements no significant constipation or diarrhea. Patient denies any dysuria, hematuria or significant nocturia. Patient denies any problems walking, swelling in the joints or loss of balance. Patient denies any skin changes, loss of hair or loss of weight. Patient denies any excessive worrying or anxiety or significant depression. Patient denies any problems with insomnia. Patient denies excessive thirst, polyuria, polydipsia. Patient denies any swollen glands, patient denies easy bruising or easy bleeding. Patient denies any recent infections, allergies or URI. Patient s visual fields have not changed significantly in recent time.   PHYSICAL EXAM: BP 108/79   Pulse 92   Temp 98.3 F (36.8 C) (Tympanic)   Resp 16   Wt (!) 322 lb (146.1 kg) Comment: Stated wt  BMI 44.28 kg/m  Gentle range of motion of his  left hip does elicit significant pain.  Motor and sensory levels are equal and symmetric in the lower extremities.  Range of motion of his right hip does not elicit pain.  Well-developed well-nourished patient in NAD. HEENT reveals PERLA, EOMI, discs not visualized.  Oral cavity is clear. No oral mucosal lesions are identified. Neck is clear without evidence of cervical or supraclavicular adenopathy. Lungs are clear to A&P. Cardiac examination is essentially unremarkable with regular rate and rhythm without murmur rub or thrill. Abdomen is benign with no organomegaly or masses noted. Motor sensory and DTR levels are equal and symmetric in the upper and lower extremities. Cranial nerves II through XII are grossly intact. Proprioception is intact. No peripheral adenopathy or edema is identified. No motor or sensory levels are noted. Crude visual fields are within normal range.  LABORATORY DATA: Labs reviewed    RADIOLOGY RESULTS: MRI of his hip reviewed compatible with above-stated findings   IMPRESSION: Severe osteoarthritis of the left hip in obese 73 year old male  PLAN: This time I have recommended LDR to his left hip.  Plan delivering 3 Gray in 6 fractions.  Risks and benefits of treatment including almost 0  side effect profile were reviewed with the patient and his wife.  I personally set up and ordered CT simulation for first thing next week.  Patient wife comprehend the treatment plan as well.  I would like to take this opportunity to thank you for allowing me to participate in the care of your patient.SABRA Marcey Penton, MD         "

## 2024-12-01 ENCOUNTER — Ambulatory Visit

## 2024-12-02 ENCOUNTER — Ambulatory Visit
Admission: RE | Admit: 2024-12-02 | Discharge: 2024-12-02 | Disposition: A | Discharge status: Home / Self Care | Source: Ambulatory Visit | Attending: Radiation Oncology | Admitting: Radiation Oncology

## 2024-12-02 DIAGNOSIS — M1612 Unilateral primary osteoarthritis, left hip: Secondary | ICD-10-CM | POA: Insufficient documentation

## 2024-12-04 DIAGNOSIS — M1612 Unilateral primary osteoarthritis, left hip: Secondary | ICD-10-CM | POA: Insufficient documentation

## 2024-12-10 ENCOUNTER — Ambulatory Visit
Admission: RE | Admit: 2024-12-10 | Discharge: 2024-12-10 | Disposition: A | Discharge status: Home / Self Care | Source: Ambulatory Visit | Attending: Radiation Oncology | Admitting: Radiation Oncology

## 2024-12-10 ENCOUNTER — Other Ambulatory Visit: Payer: Self-pay

## 2024-12-10 DIAGNOSIS — M1612 Unilateral primary osteoarthritis, left hip: Secondary | ICD-10-CM | POA: Diagnosis not present

## 2024-12-10 LAB — RAD ONC ARIA SESSION SUMMARY
Course Elapsed Days: 0
Plan Fractions Treated to Date: 1
Plan Prescribed Dose Per Fraction: 0.5 Gy
Plan Total Fractions Prescribed: 6
Plan Total Prescribed Dose: 3 Gy
Reference Point Dosage Given to Date: 0.5 Gy
Reference Point Session Dosage Given: 0.5 Gy
Session Number: 1

## 2024-12-15 ENCOUNTER — Ambulatory Visit
Admission: RE | Admit: 2024-12-15 | Discharge: 2024-12-15 | Disposition: A | Source: Ambulatory Visit | Attending: Radiation Oncology | Admitting: Radiation Oncology

## 2024-12-15 ENCOUNTER — Other Ambulatory Visit: Payer: Self-pay

## 2024-12-15 DIAGNOSIS — M1612 Unilateral primary osteoarthritis, left hip: Secondary | ICD-10-CM | POA: Diagnosis not present

## 2024-12-15 LAB — RAD ONC ARIA SESSION SUMMARY
Course Elapsed Days: 5
Plan Fractions Treated to Date: 2
Plan Prescribed Dose Per Fraction: 0.5 Gy
Plan Total Fractions Prescribed: 6
Plan Total Prescribed Dose: 3 Gy
Reference Point Dosage Given to Date: 1 Gy
Reference Point Session Dosage Given: 0.5 Gy
Session Number: 2

## 2024-12-17 ENCOUNTER — Ambulatory Visit
Admission: RE | Admit: 2024-12-17 | Discharge: 2024-12-17 | Disposition: A | Discharge status: Home / Self Care | Source: Ambulatory Visit | Attending: Radiation Oncology | Admitting: Radiation Oncology

## 2024-12-17 ENCOUNTER — Encounter: Admitting: Physician Assistant

## 2024-12-17 ENCOUNTER — Other Ambulatory Visit: Payer: Self-pay

## 2024-12-17 DIAGNOSIS — M1612 Unilateral primary osteoarthritis, left hip: Secondary | ICD-10-CM | POA: Diagnosis not present

## 2024-12-17 LAB — RAD ONC ARIA SESSION SUMMARY
Course Elapsed Days: 7
Plan Fractions Treated to Date: 3
Plan Prescribed Dose Per Fraction: 0.5 Gy
Plan Total Fractions Prescribed: 6
Plan Total Prescribed Dose: 3 Gy
Reference Point Dosage Given to Date: 1.5 Gy
Reference Point Session Dosage Given: 0.5 Gy
Session Number: 3

## 2024-12-18 ENCOUNTER — Other Ambulatory Visit: Payer: Self-pay | Admitting: Family Medicine

## 2024-12-22 ENCOUNTER — Ambulatory Visit
Admission: RE | Admit: 2024-12-22 | Discharge: 2024-12-22 | Disposition: A | Source: Ambulatory Visit | Attending: Radiation Oncology | Admitting: Radiation Oncology

## 2024-12-22 ENCOUNTER — Other Ambulatory Visit: Payer: Self-pay

## 2024-12-22 DIAGNOSIS — M1612 Unilateral primary osteoarthritis, left hip: Secondary | ICD-10-CM | POA: Diagnosis not present

## 2024-12-22 LAB — RAD ONC ARIA SESSION SUMMARY
Course Elapsed Days: 12
Plan Fractions Treated to Date: 4
Plan Prescribed Dose Per Fraction: 0.5 Gy
Plan Total Fractions Prescribed: 6
Plan Total Prescribed Dose: 3 Gy
Reference Point Dosage Given to Date: 2 Gy
Reference Point Session Dosage Given: 0.5 Gy
Session Number: 4

## 2024-12-23 ENCOUNTER — Encounter: Admitting: Physician Assistant

## 2024-12-24 ENCOUNTER — Other Ambulatory Visit: Payer: Self-pay

## 2024-12-24 ENCOUNTER — Ambulatory Visit
Admission: RE | Admit: 2024-12-24 | Discharge: 2024-12-24 | Disposition: A | Source: Ambulatory Visit | Attending: Radiation Oncology | Admitting: Radiation Oncology

## 2024-12-24 DIAGNOSIS — M1612 Unilateral primary osteoarthritis, left hip: Secondary | ICD-10-CM | POA: Diagnosis not present

## 2024-12-24 LAB — RAD ONC ARIA SESSION SUMMARY
Course Elapsed Days: 14
Plan Fractions Treated to Date: 5
Plan Prescribed Dose Per Fraction: 0.5 Gy
Plan Total Fractions Prescribed: 6
Plan Total Prescribed Dose: 3 Gy
Reference Point Dosage Given to Date: 2.5 Gy
Reference Point Session Dosage Given: 0.5 Gy
Session Number: 5

## 2024-12-29 ENCOUNTER — Ambulatory Visit

## 2024-12-30 ENCOUNTER — Ambulatory Visit

## 2024-12-31 ENCOUNTER — Other Ambulatory Visit: Payer: Self-pay | Admitting: Cardiology

## 2024-12-31 ENCOUNTER — Ambulatory Visit

## 2025-01-01 ENCOUNTER — Other Ambulatory Visit: Payer: Self-pay

## 2025-01-01 ENCOUNTER — Ambulatory Visit
Admission: RE | Admit: 2025-01-01 | Discharge: 2025-01-01 | Disposition: A | Discharge status: Home / Self Care | Source: Ambulatory Visit | Attending: Radiation Oncology | Admitting: Radiation Oncology

## 2025-01-01 ENCOUNTER — Encounter: Attending: Physician Assistant | Admitting: Physician Assistant

## 2025-01-01 DIAGNOSIS — I87331 Chronic venous hypertension (idiopathic) with ulcer and inflammation of right lower extremity: Secondary | ICD-10-CM | POA: Diagnosis not present

## 2025-01-01 DIAGNOSIS — I480 Paroxysmal atrial fibrillation: Secondary | ICD-10-CM | POA: Diagnosis not present

## 2025-01-01 DIAGNOSIS — I890 Lymphedema, not elsewhere classified: Secondary | ICD-10-CM | POA: Insufficient documentation

## 2025-01-01 DIAGNOSIS — L97812 Non-pressure chronic ulcer of other part of right lower leg with fat layer exposed: Secondary | ICD-10-CM | POA: Diagnosis not present

## 2025-01-01 DIAGNOSIS — C44722 Squamous cell carcinoma of skin of right lower limb, including hip: Secondary | ICD-10-CM | POA: Insufficient documentation

## 2025-01-01 DIAGNOSIS — M1612 Unilateral primary osteoarthritis, left hip: Secondary | ICD-10-CM | POA: Diagnosis not present

## 2025-01-01 DIAGNOSIS — Z7901 Long term (current) use of anticoagulants: Secondary | ICD-10-CM | POA: Insufficient documentation

## 2025-01-01 LAB — RAD ONC ARIA SESSION SUMMARY
Course Elapsed Days: 22
Plan Fractions Treated to Date: 6
Plan Prescribed Dose Per Fraction: 0.5 Gy
Plan Total Fractions Prescribed: 6
Plan Total Prescribed Dose: 3 Gy
Reference Point Dosage Given to Date: 3 Gy
Reference Point Session Dosage Given: 0.5 Gy
Session Number: 6

## 2025-01-02 NOTE — Radiation Completion Notes (Signed)
 Patient Name: Charles Floyd, Charles Floyd MRN: [id number] Date of Birth: 1951-07-30 Referring Physician: JON EVA, M.D. Date of Service: 2025-01-02 Radiation Oncologist: Marcey Penton, M.D. [facility]                             RADIATION ONCOLOGY END OF TREATMENT NOTE     Diagnosis: M16.12 Unilateral primary osteoarthritis, left hip Intent: Curative     HPI: Patient is a 74 year old male morbidly obese with severe osteoarthritis of the left hip.  He is being treated currently with gabapentin  as well as Tylenol .  He also occasionally uses tramadol .  He walks with the assistance of a walker.  He had an MRI of his left hip a year ago showing severe left hip osteoarthritis.  He is seen today for consideration of low-dose radiation for osteoarthritis.      ==========DELIVERED PLANS==========  First Treatment Date: 2024-12-10 Last Treatment Date: 2025-01-01   Plan Name: Ext_L Site: Hip, Left Technique: Isodose Plan Mode: Photon Dose Per Fraction: 0.5 Gy Prescribed Dose (Delivered / Prescribed): 3 Gy / 3 Gy Prescribed Fxs (Delivered / Prescribed): 6 / 6     ==========ON TREATMENT VISIT DATES========== 2024-12-17     ==========UPCOMING VISITS========== 02/04/2025 [facility] RAD ONCOLOGY FOLLOW UP 30 Chrystal, Glenn, MD        ==========APPENDIX - ON TREATMENT VISIT NOTES==========   See weekly On Treatment Notes in Epic for details in the Media tab (listed as Progress notes on the On Treatment Visit Dates listed above).

## 2025-02-04 ENCOUNTER — Ambulatory Visit: Admitting: Radiation Oncology

## 2025-04-14 ENCOUNTER — Encounter: Admitting: Family Medicine
